# Patient Record
Sex: Male | Born: 1955 | Race: Black or African American | Hispanic: No | Marital: Single | Smoking: Current every day smoker
Health system: Southern US, Community
[De-identification: ages and names within clinical notes are randomized; demographics above are authoritative.]

## PROBLEM LIST (undated history)

## (undated) DIAGNOSIS — E1142 Type 2 diabetes mellitus with diabetic polyneuropathy: Secondary | ICD-10-CM

## (undated) DIAGNOSIS — Z5189 Encounter for other specified aftercare: Secondary | ICD-10-CM

## (undated) DIAGNOSIS — I2510 Atherosclerotic heart disease of native coronary artery without angina pectoris: Secondary | ICD-10-CM

## (undated) DIAGNOSIS — C189 Malignant neoplasm of colon, unspecified: Secondary | ICD-10-CM

## (undated) DIAGNOSIS — I252 Old myocardial infarction: Secondary | ICD-10-CM

## (undated) DIAGNOSIS — M109 Gout, unspecified: Secondary | ICD-10-CM

## (undated) DIAGNOSIS — L732 Hidradenitis suppurativa: Secondary | ICD-10-CM

## (undated) DIAGNOSIS — E119 Type 2 diabetes mellitus without complications: Secondary | ICD-10-CM

## (undated) DIAGNOSIS — F99 Mental disorder, not otherwise specified: Secondary | ICD-10-CM

## (undated) DIAGNOSIS — D649 Anemia, unspecified: Secondary | ICD-10-CM

## (undated) DIAGNOSIS — I208 Other forms of angina pectoris: Secondary | ICD-10-CM

## (undated) DIAGNOSIS — I2089 Other forms of angina pectoris: Secondary | ICD-10-CM

## (undated) DIAGNOSIS — I69359 Hemiplegia and hemiparesis following cerebral infarction affecting unspecified side: Secondary | ICD-10-CM

## (undated) DIAGNOSIS — E46 Unspecified protein-calorie malnutrition: Secondary | ICD-10-CM

## (undated) DIAGNOSIS — I639 Cerebral infarction, unspecified: Secondary | ICD-10-CM

## (undated) HISTORY — PX: CARDIAC SURGERY: SHX584

## (undated) HISTORY — PX: OTHER SURGICAL HISTORY: SHX169

---

## 2006-09-26 ENCOUNTER — Ambulatory Visit: Payer: Self-pay | Admitting: Oncology

## 2006-09-26 ENCOUNTER — Encounter (HOSPITAL_COMMUNITY): Admission: RE | Admit: 2006-09-26 | Discharge: 2006-09-26 | Payer: Self-pay | Admitting: Oncology

## 2006-09-26 LAB — CBC WITH DIFFERENTIAL (CANCER CENTER ONLY)
EOS%: 1.1 % (ref 0.0–7.0)
Eosinophils Absolute: 0.2 10*3/uL (ref 0.0–0.5)
LYMPH%: 12.3 % — ABNORMAL LOW (ref 14.0–48.0)
MCH: 21.3 pg — ABNORMAL LOW (ref 28.0–33.4)
MCHC: 30.8 g/dL — ABNORMAL LOW (ref 32.0–35.9)
MCV: 69 fL — ABNORMAL LOW (ref 82–98)
MONO%: 5.9 % (ref 0.0–13.0)
NEUT#: 11.4 10*3/uL — ABNORMAL HIGH (ref 1.5–6.5)
Platelets: 705 10*3/uL — ABNORMAL HIGH (ref 145–400)
RBC: 3.6 10*6/uL — ABNORMAL LOW (ref 4.20–5.70)
RDW: 16.5 % — ABNORMAL HIGH (ref 10.5–14.6)

## 2006-09-27 LAB — COMPREHENSIVE METABOLIC PANEL
ALT: 10 U/L (ref 0–53)
AST: 12 U/L (ref 0–37)
Albumin: 2.4 g/dL — ABNORMAL LOW (ref 3.5–5.2)
Alkaline Phosphatase: 121 U/L — ABNORMAL HIGH (ref 39–117)
BUN: 7 mg/dL (ref 6–23)
CO2: 28 mEq/L (ref 19–32)
Calcium: 8.8 mg/dL (ref 8.4–10.5)
Chloride: 98 mEq/L (ref 96–112)
Creatinine, Ser: 0.8 mg/dL (ref 0.40–1.50)
Glucose, Bld: 131 mg/dL — ABNORMAL HIGH (ref 70–99)
Potassium: 4 mEq/L (ref 3.5–5.3)
Sodium: 133 mEq/L — ABNORMAL LOW (ref 135–145)
Total Bilirubin: 0.4 mg/dL (ref 0.3–1.2)
Total Protein: 9.1 g/dL — ABNORMAL HIGH (ref 6.0–8.3)

## 2006-09-27 LAB — IRON AND TIBC
Iron: <10 ug/dL — ABNORMAL LOW (ref 42–165)
UIBC: 277 ug/dL

## 2006-09-27 LAB — FOLATE: Folate: 6 ng/mL

## 2006-09-27 LAB — LACTATE DEHYDROGENASE: LDH: 92 U/L — ABNORMAL LOW (ref 94–250)

## 2006-09-27 LAB — VITAMIN B12: Vitamin B-12: 441 pg/mL (ref 211–911)

## 2006-09-27 LAB — RETICULOCYTES (CHCC)
ABS Retic: 78.8 10*3/uL (ref 19.0–186.0)
RBC.: 3.58 MIL/uL — ABNORMAL LOW (ref 4.20–5.50)
Retic Ct Pct: 2.2 % (ref 0.4–3.1)

## 2006-09-27 LAB — ERYTHROPOIETIN: Erythropoietin: 156 m[IU]/mL — ABNORMAL HIGH (ref 2.6–34.0)

## 2006-11-14 ENCOUNTER — Ambulatory Visit: Payer: Self-pay | Admitting: Oncology

## 2009-06-28 ENCOUNTER — Inpatient Hospital Stay: Payer: Self-pay | Admitting: *Deleted

## 2010-03-27 ENCOUNTER — Inpatient Hospital Stay: Payer: Self-pay | Admitting: Internal Medicine

## 2010-05-25 ENCOUNTER — Inpatient Hospital Stay: Payer: Self-pay | Admitting: Surgery

## 2010-08-22 IMAGING — CR DG CHEST 1V PORT
1 series · 1 of 1 positions shown · non-contrast
Comparison: none

REASON FOR EXAM: crackles rll
COMMENTS:

[view not recorded]
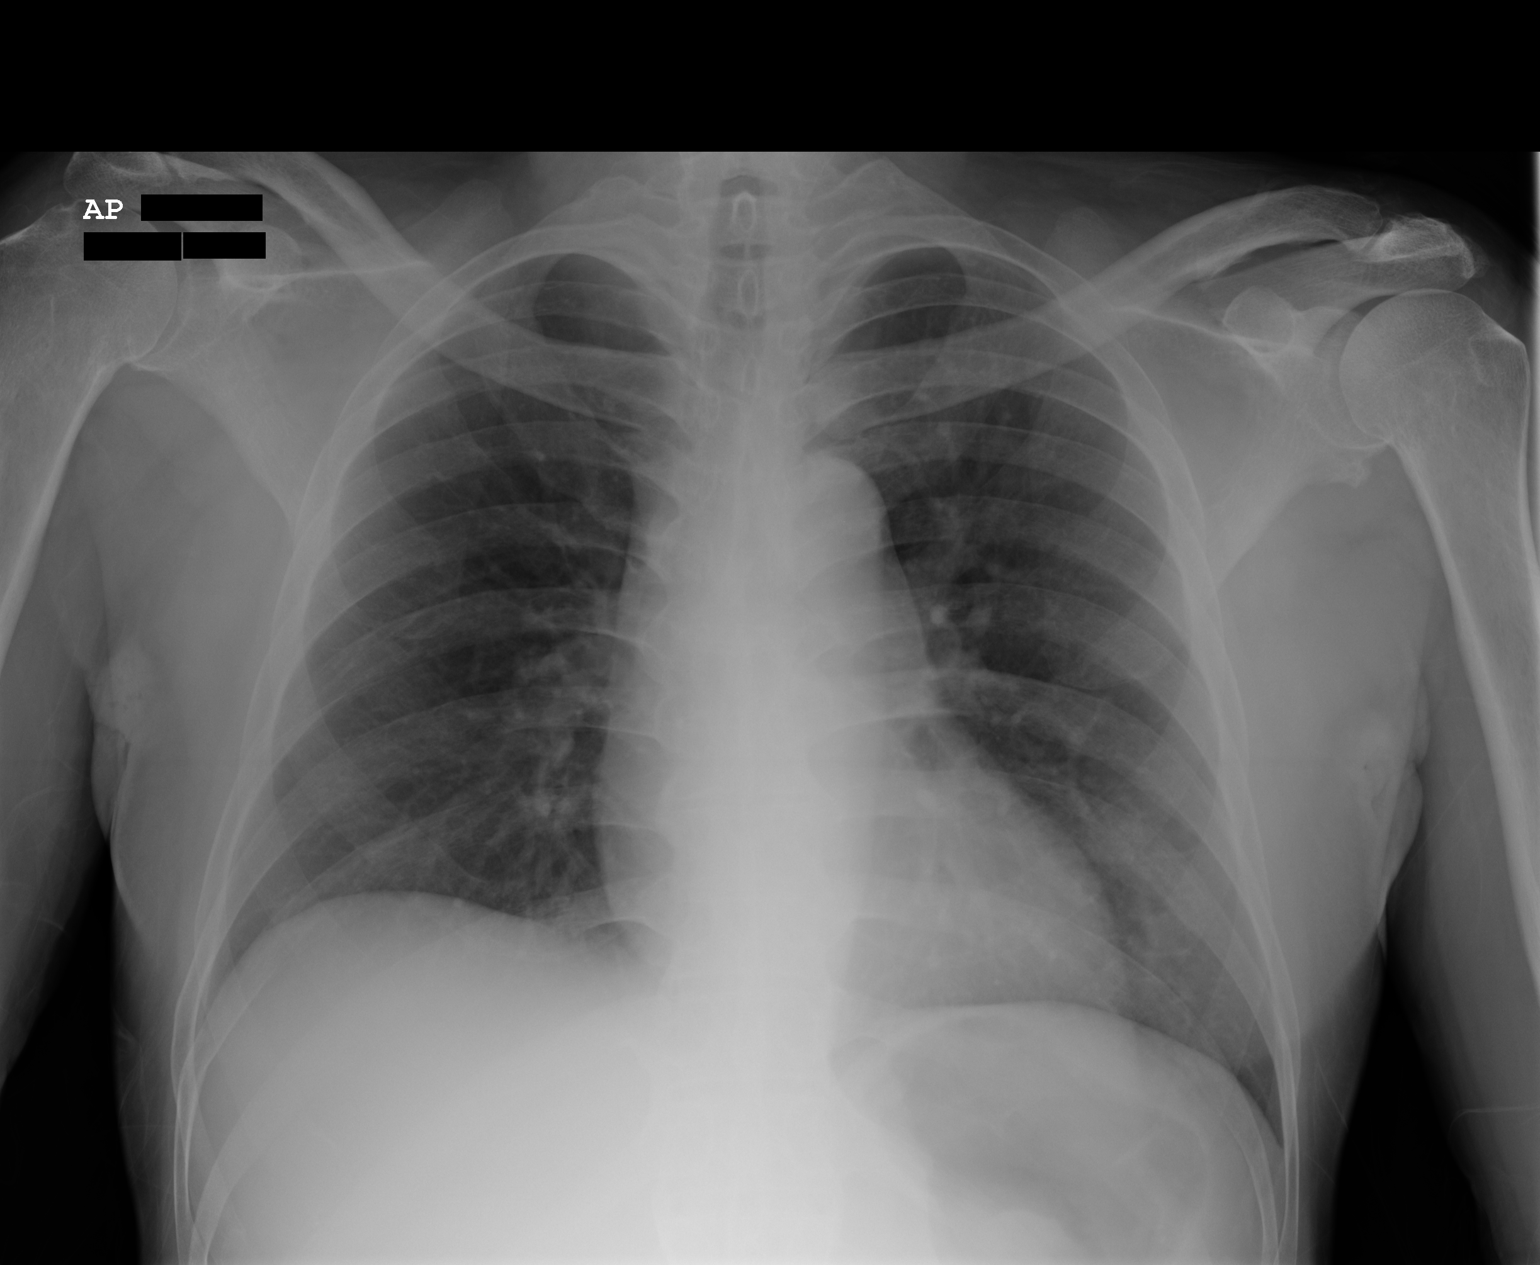

[1 of 1 positions shown; findings below may reference images not displayed]

PROCEDURE:     DXR - DXR PORTABLE CHEST SINGLE VIEW  - May 25, 2010  [DATE]

RESULT:     Comparison made to study 27 June, 2009.

The lungs are mildly hypo-inflated. There is no focal infiltrate. The
cardiac silhouette is normal in size. The pulmonary vascularity is not
engorged. Minimal prominence of the perihilar lung markings is noted but the
lungs are not well inflated.
IMPRESSION: I see no evidence of CHF nor of pneumonia. The study is
limited due to hypoinflation. Mildly increased perihilar lung markings are
noted but are nonspecific.

## 2010-08-22 IMAGING — CT CT PELVIS W/ CM
1 of 3 series · 14 of 32 positions shown, 19 images · non-contrast
Comparison: none

REASON FOR EXAM: buttock abcess large  iv contrast only
COMMENTS:

[Series 2: soft tissue · axial · 0.67mm/px · z∈[-344,-108]mm · 14 of 55 slices shown, 19 images]
[im 4/55  soft-tissue]
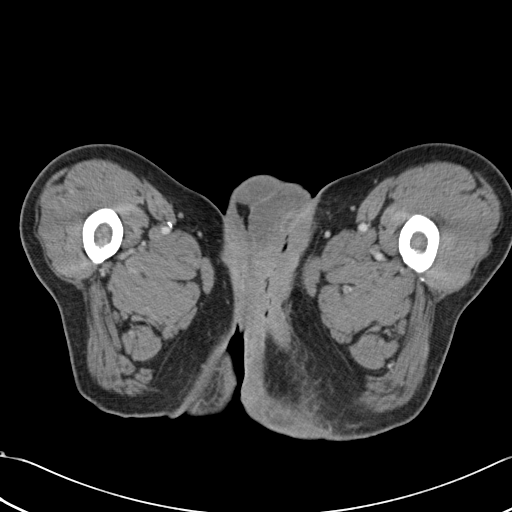
[im 4/55  bone]
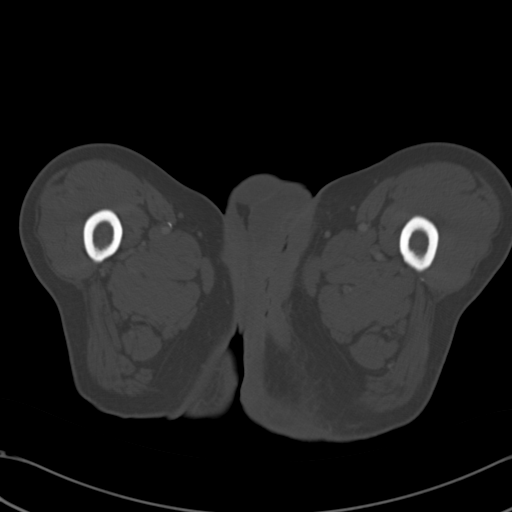
[im 7/55  soft-tissue]
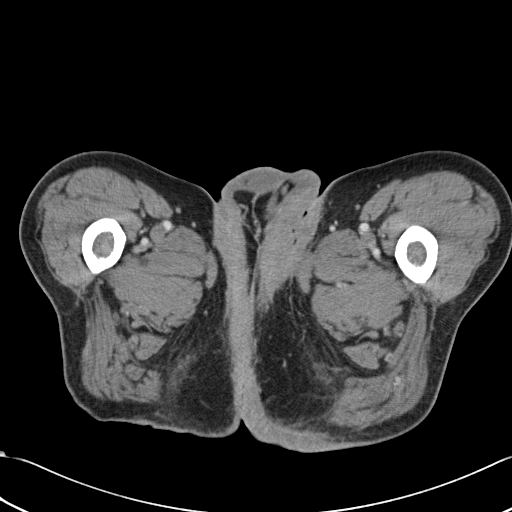
[im 11/55  soft-tissue]
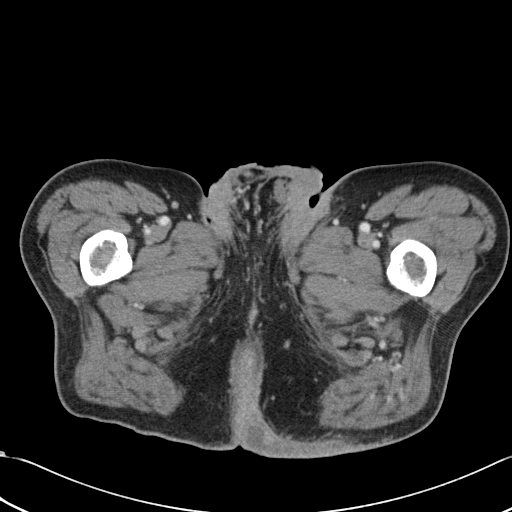
[im 17/55  soft-tissue]
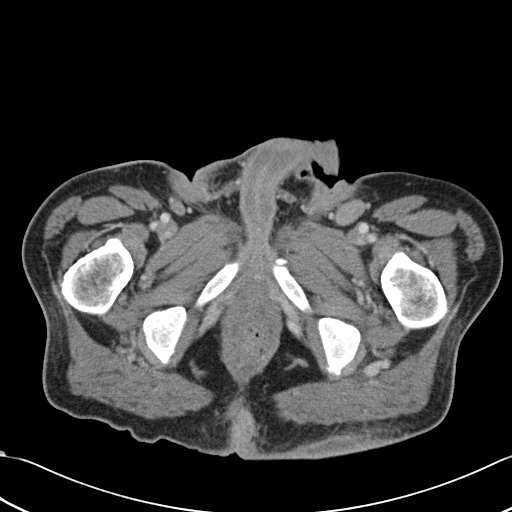
[im 21/55  soft-tissue]
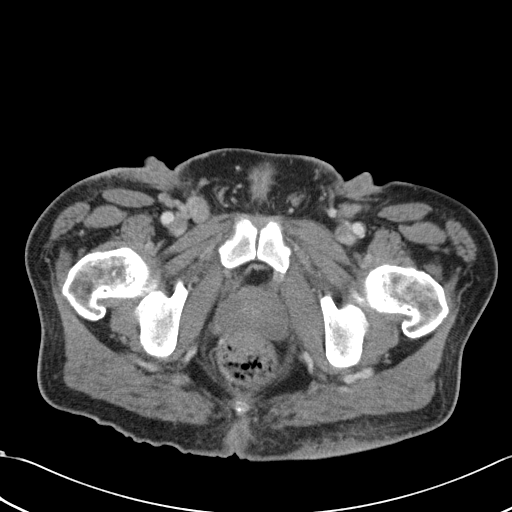
[im 24/55  soft-tissue]
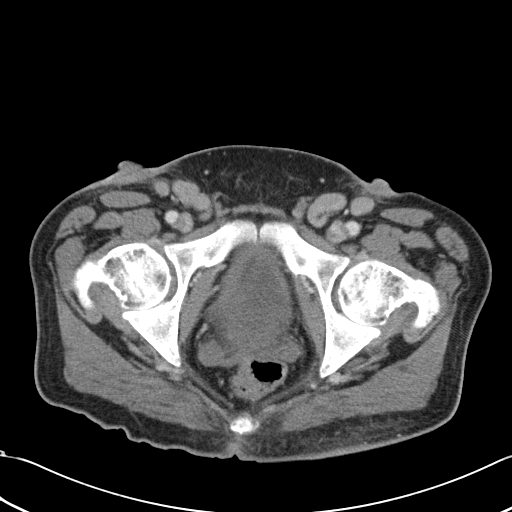
[im 28/55  soft-tissue]
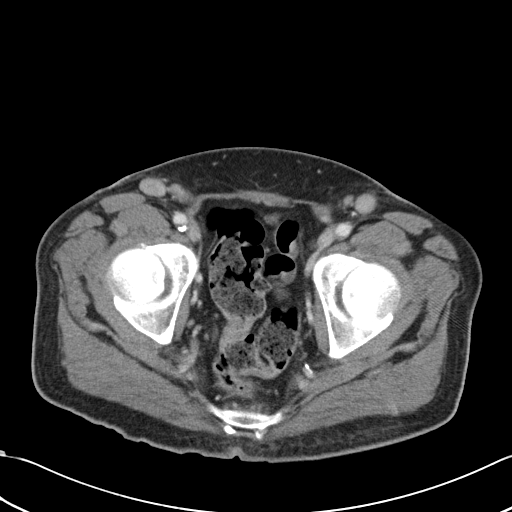
[im 31/55  soft-tissue]
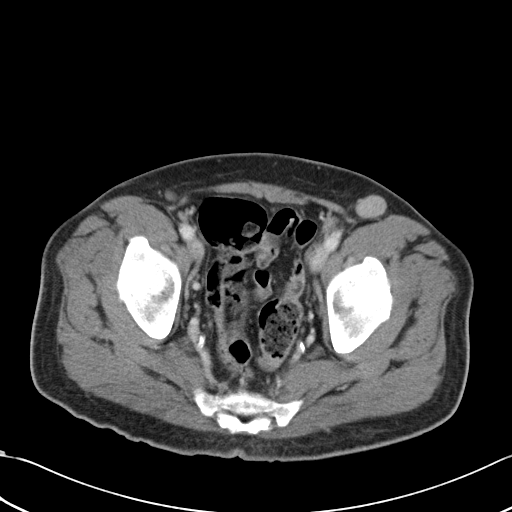
[im 34/55  soft-tissue]
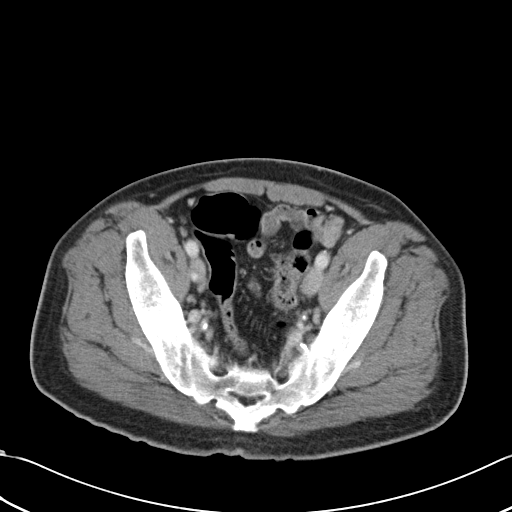
[im 34/55  bone]
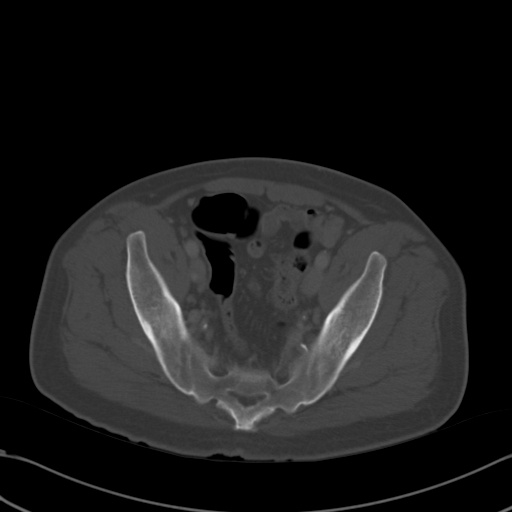
[im 38/55  soft-tissue]
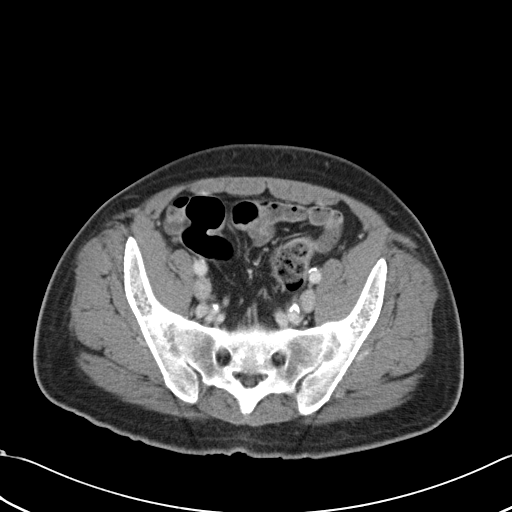
[im 41/55  lung]
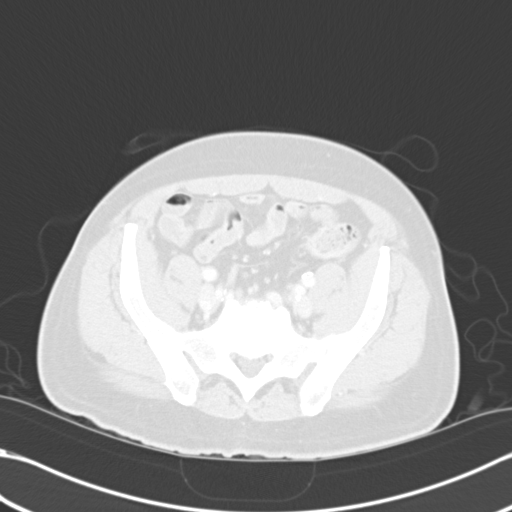
[im 44/55  soft-tissue]
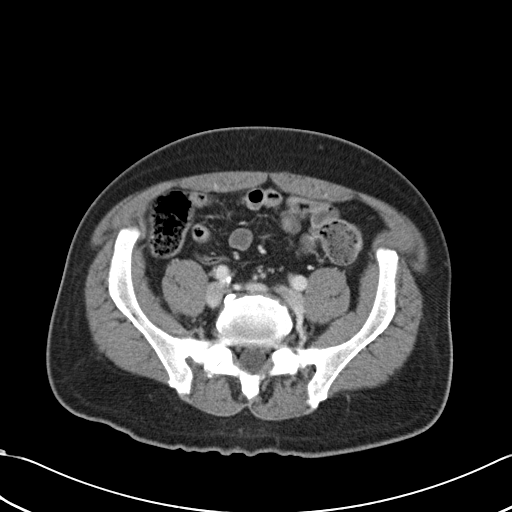
[im 44/55  lung]
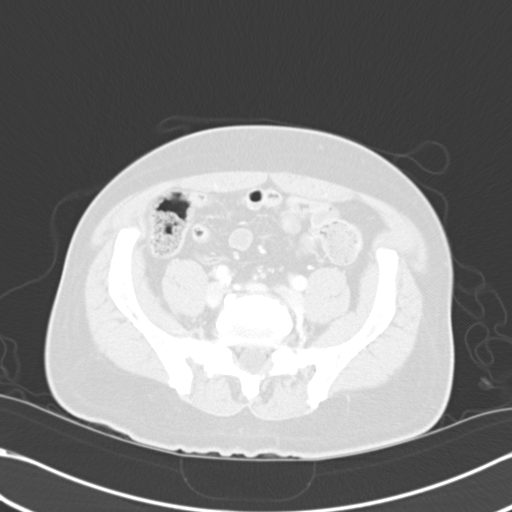
[im 48/55  soft-tissue]
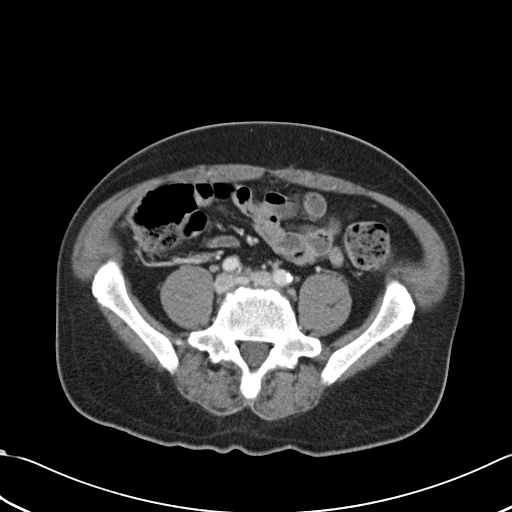
[im 48/55  lung]
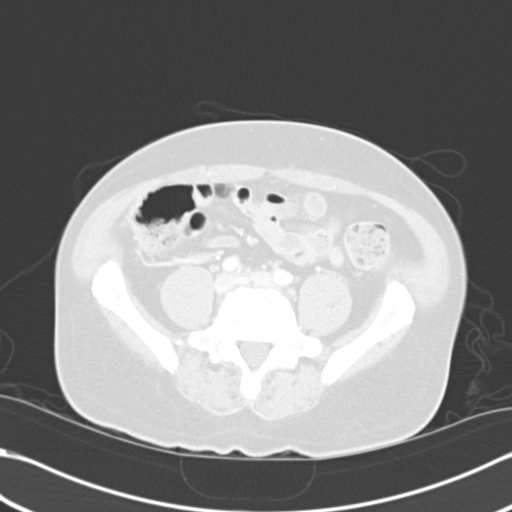
[im 51/55  soft-tissue]
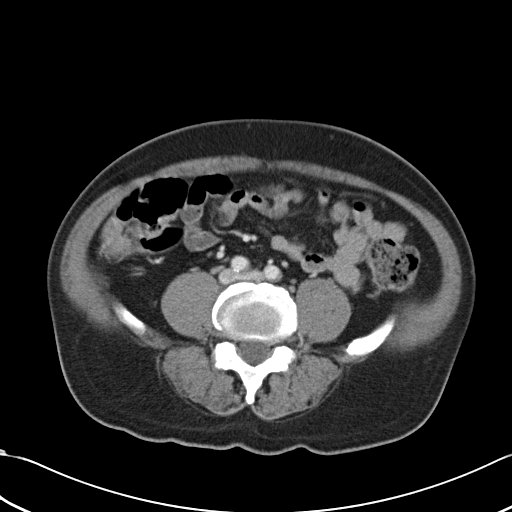
[im 51/55  lung]
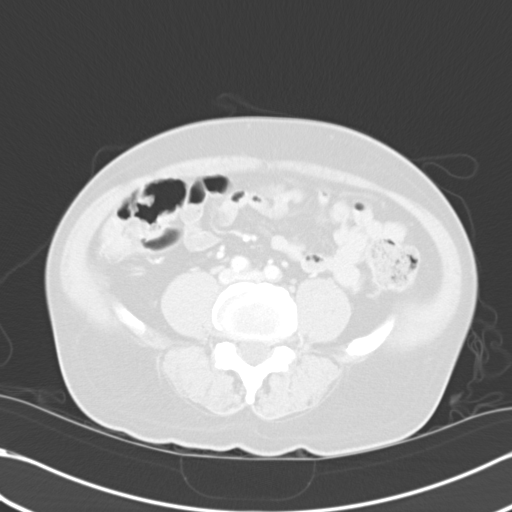

[14 of 32 positions shown; findings below may reference images not displayed]

PROCEDURE:     CT  - CT PELVIS STANDARD W  - May 25, 2010  [DATE]

RESULT:     CT of the pelvis is performed emergently with 100 mL of
9sovue-VII iodinated intravenous contrast. Oral contrast was not
administered. Images are reconstructed at 5 mm slice thickness in the axial
plane. There is no previous exam for comparison.

There is soft tissue thickening along the medial proximal left thigh
extending into the peritoneal region and left gluteal region consistent with
inflammation. There does appear to be a small amount of fluid near the
midline to the left measuring approximately 1.65 cm on image 20 with a thin
area of fluid attenuation measuring 7.1 mm in depth and approximately
cm in length over the left inferior gluteal region on image 15. Bilateral
inguinal adenopathy is present. Within the pelvis the urinary bladder, colon
and visualized small bowel is unremarkable.
IMPRESSION: Soft tissue abscess over the left gluteal region and near
the perineum region and medial left thigh. Bilateral inguinal adenopathy. No
pelvic adenopathy seen. Surgical consultation and infectious disease
consultation is recommended.

## 2011-07-26 ENCOUNTER — Emergency Department: Payer: Self-pay | Admitting: Emergency Medicine

## 2011-10-10 ENCOUNTER — Inpatient Hospital Stay: Payer: Self-pay | Admitting: Internal Medicine

## 2011-10-26 ENCOUNTER — Encounter: Payer: Self-pay | Admitting: Family Medicine

## 2011-11-11 ENCOUNTER — Encounter: Payer: Self-pay | Admitting: Family Medicine

## 2011-12-04 ENCOUNTER — Emergency Department: Payer: Self-pay | Admitting: Emergency Medicine

## 2011-12-10 ENCOUNTER — Encounter: Payer: Self-pay | Admitting: Family Medicine

## 2012-01-02 ENCOUNTER — Emergency Department: Payer: Self-pay | Admitting: Internal Medicine

## 2012-03-10 ENCOUNTER — Inpatient Hospital Stay: Payer: Self-pay | Admitting: Internal Medicine

## 2012-04-03 ENCOUNTER — Emergency Department: Payer: Self-pay | Admitting: *Deleted

## 2012-04-10 ENCOUNTER — Inpatient Hospital Stay: Payer: Self-pay | Admitting: Internal Medicine

## 2012-04-27 ENCOUNTER — Inpatient Hospital Stay: Payer: Self-pay | Admitting: Surgery

## 2012-08-21 ENCOUNTER — Ambulatory Visit: Payer: Self-pay | Admitting: Oncology

## 2012-09-08 ENCOUNTER — Ambulatory Visit: Payer: Self-pay | Admitting: Oncology

## 2012-09-11 ENCOUNTER — Ambulatory Visit: Payer: Self-pay | Admitting: Oncology

## 2012-09-21 ENCOUNTER — Emergency Department: Payer: Self-pay | Admitting: Emergency Medicine

## 2012-10-08 ENCOUNTER — Ambulatory Visit: Payer: Self-pay | Admitting: Oncology

## 2013-02-06 ENCOUNTER — Ambulatory Visit: Payer: Self-pay | Admitting: Oncology

## 2013-03-30 ENCOUNTER — Inpatient Hospital Stay (HOSPITAL_COMMUNITY)
Admission: EM | Admit: 2013-03-30 | Discharge: 2013-04-10 | DRG: 607 | Disposition: A | Discharge status: Skilled Nursing Facility | Payer: Medicare (Managed Care) | Attending: Internal Medicine | Admitting: Internal Medicine

## 2013-03-30 ENCOUNTER — Emergency Department (HOSPITAL_COMMUNITY): Payer: Medicare (Managed Care)

## 2013-03-30 ENCOUNTER — Encounter (HOSPITAL_COMMUNITY): Payer: Self-pay | Admitting: Cardiology

## 2013-03-30 DIAGNOSIS — L8990 Pressure ulcer of unspecified site, unspecified stage: Secondary | ICD-10-CM | POA: Diagnosis present

## 2013-03-30 DIAGNOSIS — E785 Hyperlipidemia, unspecified: Secondary | ICD-10-CM | POA: Diagnosis present

## 2013-03-30 DIAGNOSIS — D500 Iron deficiency anemia secondary to blood loss (chronic): Secondary | ICD-10-CM | POA: Diagnosis present

## 2013-03-30 DIAGNOSIS — L02419 Cutaneous abscess of limb, unspecified: Secondary | ICD-10-CM | POA: Diagnosis present

## 2013-03-30 DIAGNOSIS — Z85038 Personal history of other malignant neoplasm of large intestine: Secondary | ICD-10-CM

## 2013-03-30 DIAGNOSIS — Z79899 Other long term (current) drug therapy: Secondary | ICD-10-CM

## 2013-03-30 DIAGNOSIS — IMO0002 Reserved for concepts with insufficient information to code with codable children: Secondary | ICD-10-CM

## 2013-03-30 DIAGNOSIS — Z7982 Long term (current) use of aspirin: Secondary | ICD-10-CM

## 2013-03-30 DIAGNOSIS — L0390 Cellulitis, unspecified: Secondary | ICD-10-CM

## 2013-03-30 DIAGNOSIS — L89309 Pressure ulcer of unspecified buttock, unspecified stage: Secondary | ICD-10-CM | POA: Diagnosis present

## 2013-03-30 DIAGNOSIS — F209 Schizophrenia, unspecified: Secondary | ICD-10-CM

## 2013-03-30 DIAGNOSIS — L0231 Cutaneous abscess of buttock: Secondary | ICD-10-CM | POA: Diagnosis present

## 2013-03-30 DIAGNOSIS — F0390 Unspecified dementia without behavioral disturbance: Secondary | ICD-10-CM

## 2013-03-30 DIAGNOSIS — Z9861 Coronary angioplasty status: Secondary | ICD-10-CM

## 2013-03-30 DIAGNOSIS — E1149 Type 2 diabetes mellitus with other diabetic neurological complication: Secondary | ICD-10-CM | POA: Diagnosis present

## 2013-03-30 DIAGNOSIS — A419 Sepsis, unspecified organism: Secondary | ICD-10-CM

## 2013-03-30 DIAGNOSIS — M109 Gout, unspecified: Secondary | ICD-10-CM | POA: Diagnosis present

## 2013-03-30 DIAGNOSIS — I252 Old myocardial infarction: Secondary | ICD-10-CM

## 2013-03-30 DIAGNOSIS — I2510 Atherosclerotic heart disease of native coronary artery without angina pectoris: Secondary | ICD-10-CM

## 2013-03-30 DIAGNOSIS — Z7902 Long term (current) use of antithrombotics/antiplatelets: Secondary | ICD-10-CM

## 2013-03-30 DIAGNOSIS — Z8673 Personal history of transient ischemic attack (TIA), and cerebral infarction without residual deficits: Secondary | ICD-10-CM

## 2013-03-30 DIAGNOSIS — D649 Anemia, unspecified: Secondary | ICD-10-CM | POA: Diagnosis present

## 2013-03-30 DIAGNOSIS — F17200 Nicotine dependence, unspecified, uncomplicated: Secondary | ICD-10-CM | POA: Diagnosis present

## 2013-03-30 DIAGNOSIS — E119 Type 2 diabetes mellitus without complications: Secondary | ICD-10-CM

## 2013-03-30 DIAGNOSIS — E46 Unspecified protein-calorie malnutrition: Secondary | ICD-10-CM | POA: Diagnosis present

## 2013-03-30 DIAGNOSIS — L02219 Cutaneous abscess of trunk, unspecified: Secondary | ICD-10-CM | POA: Diagnosis present

## 2013-03-30 DIAGNOSIS — E1169 Type 2 diabetes mellitus with other specified complication: Secondary | ICD-10-CM | POA: Diagnosis not present

## 2013-03-30 DIAGNOSIS — L732 Hidradenitis suppurativa: Secondary | ICD-10-CM

## 2013-03-30 DIAGNOSIS — I69959 Hemiplegia and hemiparesis following unspecified cerebrovascular disease affecting unspecified side: Secondary | ICD-10-CM

## 2013-03-30 DIAGNOSIS — E871 Hypo-osmolality and hyponatremia: Secondary | ICD-10-CM | POA: Diagnosis present

## 2013-03-30 DIAGNOSIS — K5900 Constipation, unspecified: Secondary | ICD-10-CM | POA: Diagnosis present

## 2013-03-30 DIAGNOSIS — E1142 Type 2 diabetes mellitus with diabetic polyneuropathy: Secondary | ICD-10-CM | POA: Diagnosis present

## 2013-03-30 DIAGNOSIS — L03119 Cellulitis of unspecified part of limb: Secondary | ICD-10-CM | POA: Diagnosis present

## 2013-03-30 HISTORY — DX: Other forms of angina pectoris: I20.8

## 2013-03-30 HISTORY — DX: Cerebral infarction, unspecified: I63.9

## 2013-03-30 HISTORY — DX: Old myocardial infarction: I25.2

## 2013-03-30 HISTORY — DX: Atherosclerotic heart disease of native coronary artery without angina pectoris: I25.10

## 2013-03-30 HISTORY — DX: Type 2 diabetes mellitus with diabetic polyneuropathy: E11.42

## 2013-03-30 HISTORY — DX: Malignant neoplasm of colon, unspecified: C18.9

## 2013-03-30 HISTORY — DX: Gout, unspecified: M10.9

## 2013-03-30 HISTORY — DX: Anemia, unspecified: D64.9

## 2013-03-30 HISTORY — DX: Other forms of angina pectoris: I20.89

## 2013-03-30 HISTORY — DX: Hemiplegia and hemiparesis following cerebral infarction affecting unspecified side: I69.359

## 2013-03-30 HISTORY — DX: Encounter for other specified aftercare: Z51.89

## 2013-03-30 HISTORY — DX: Hidradenitis suppurativa: L73.2

## 2013-03-30 HISTORY — DX: Unspecified protein-calorie malnutrition: E46

## 2013-03-30 LAB — URINALYSIS, ROUTINE W REFLEX MICROSCOPIC
Glucose, UA: 100 mg/dL — AB
Hgb urine dipstick: NEGATIVE
Specific Gravity, Urine: 1.009 (ref 1.005–1.030)

## 2013-03-30 LAB — COMPREHENSIVE METABOLIC PANEL
ALT: 19 U/L (ref 0–53)
Albumin: 2.1 g/dL — ABNORMAL LOW (ref 3.5–5.2)
Alkaline Phosphatase: 218 U/L — ABNORMAL HIGH (ref 39–117)
BUN: 6 mg/dL (ref 6–23)
Calcium: 9.1 mg/dL (ref 8.4–10.5)
Potassium: 3.7 mEq/L (ref 3.5–5.1)
Sodium: 132 mEq/L — ABNORMAL LOW (ref 135–145)
Total Protein: 11.4 g/dL — ABNORMAL HIGH (ref 6.0–8.3)

## 2013-03-30 LAB — CG4 I-STAT (LACTIC ACID): Lactic Acid, Venous: 2.2 mmol/L (ref 0.5–2.2)

## 2013-03-30 LAB — GRAM STAIN

## 2013-03-30 LAB — CBC
MCH: 30.9 pg (ref 26.0–34.0)
MCH: 29.9 pg (ref 26.0–34.0)
MCHC: 33.6 g/dL (ref 30.0–36.0)
MCHC: 32.6 g/dL (ref 30.0–36.0)
Platelets: 479 10*3/uL — ABNORMAL HIGH (ref 150–400)
RDW: 15 % (ref 11.5–15.5)

## 2013-03-30 LAB — POCT I-STAT TROPONIN I: Troponin i, poc: 0 ng/mL (ref 0.00–0.08)

## 2013-03-30 LAB — PROCALCITONIN: Procalcitonin: 0.22 ng/mL

## 2013-03-30 MED ORDER — FERROUS SULFATE 325 (65 FE) MG PO TABS
325.0000 mg | ORAL_TABLET | Freq: Two times a day (BID) | ORAL | Status: DC
  Administered 2013-03-31 – 2013-04-10 (×22): 325 mg via ORAL
  Filled 2013-03-30 (×23): qty 1

## 2013-03-30 MED ORDER — GABAPENTIN 300 MG PO CAPS
600.0000 mg | ORAL_CAPSULE | Freq: Every day | ORAL | Status: DC
  Administered 2013-03-31 – 2013-04-09 (×11): 600 mg via ORAL
  Filled 2013-03-30 (×12): qty 2

## 2013-03-30 MED ORDER — SODIUM CHLORIDE 0.9 % IJ SOLN
3.0000 mL | Freq: Two times a day (BID) | INTRAMUSCULAR | Status: DC
  Administered 2013-04-03 – 2013-04-10 (×5): 3 mL via INTRAVENOUS

## 2013-03-30 MED ORDER — ALLOPURINOL 300 MG PO TABS
300.0000 mg | ORAL_TABLET | Freq: Every day | ORAL | Status: DC
  Administered 2013-03-31 – 2013-04-10 (×11): 300 mg via ORAL
  Filled 2013-03-30 (×11): qty 1

## 2013-03-30 MED ORDER — ENSURE PLUS PO LIQD
237.0000 mL | Freq: Two times a day (BID) | ORAL | Status: DC
  Administered 2013-03-31 – 2013-04-04 (×9): 237 mL via ORAL
  Filled 2013-03-30 (×14): qty 237

## 2013-03-30 MED ORDER — ASPIRIN EC 81 MG PO TBEC
81.0000 mg | DELAYED_RELEASE_TABLET | Freq: Every day | ORAL | Status: DC
  Administered 2013-03-31 – 2013-04-06 (×7): 81 mg via ORAL
  Filled 2013-03-30 (×8): qty 1

## 2013-03-30 MED ORDER — BENZTROPINE MESYLATE 1 MG PO TABS
1.0000 mg | ORAL_TABLET | Freq: Every day | ORAL | Status: DC
  Administered 2013-03-31 – 2013-04-10 (×11): 1 mg via ORAL
  Filled 2013-03-30 (×11): qty 1

## 2013-03-30 MED ORDER — CLOPIDOGREL BISULFATE 75 MG PO TABS
75.0000 mg | ORAL_TABLET | Freq: Every day | ORAL | Status: DC
  Administered 2013-03-31 – 2013-04-06 (×7): 75 mg via ORAL
  Filled 2013-03-30 (×8): qty 1

## 2013-03-30 MED ORDER — SODIUM CHLORIDE 0.9 % IV SOLN: 1000.0000 mL | Freq: Once | INTRAVENOUS | Status: DC

## 2013-03-30 MED ORDER — ATORVASTATIN CALCIUM 80 MG PO TABS
80.0000 mg | ORAL_TABLET | Freq: Every day | ORAL | Status: DC
  Administered 2013-03-31 – 2013-04-10 (×11): 80 mg via ORAL
  Filled 2013-03-30 (×11): qty 1

## 2013-03-30 MED ORDER — RISPERIDONE 3 MG PO TABS
3.0000 mg | ORAL_TABLET | Freq: Two times a day (BID) | ORAL | Status: DC
  Administered 2013-03-31 – 2013-04-10 (×22): 3 mg via ORAL
  Filled 2013-03-30 (×23): qty 1

## 2013-03-30 MED ORDER — POTASSIUM CHLORIDE CRYS ER 20 MEQ PO TBCR
60.0000 meq | EXTENDED_RELEASE_TABLET | Freq: Every day | ORAL | Status: DC
  Administered 2013-03-31 – 2013-04-03 (×4): 60 meq via ORAL
  Filled 2013-03-30 (×5): qty 3

## 2013-03-30 MED ORDER — SODIUM CHLORIDE 0.9 % IV SOLN
INTRAVENOUS | Status: DC
  Administered 2013-03-30 – 2013-03-31 (×2): via INTRAVENOUS

## 2013-03-30 MED ORDER — CAMPHOR-MENTHOL 0.5-0.5 % EX LOTN
1.0000 "application " | TOPICAL_LOTION | Freq: Every day | CUTANEOUS | Status: DC | PRN
  Filled 2013-03-30: qty 222

## 2013-03-30 MED ORDER — ONDANSETRON HCL 4 MG/2ML IJ SOLN: 4.0000 mg | Freq: Four times a day (QID) | INTRAMUSCULAR | Status: DC | PRN

## 2013-03-30 MED ORDER — DEXTROSE 50 % IV SOLN
25.0000 g | Freq: Once | INTRAVENOUS | Status: AC
  Administered 2013-03-30: 25 g via INTRAVENOUS
  Filled 2013-03-30: qty 50

## 2013-03-30 MED ORDER — MAGNESIUM OXIDE 400 (241.3 MG) MG PO TABS
400.0000 mg | ORAL_TABLET | Freq: Three times a day (TID) | ORAL | Status: DC
  Administered 2013-03-31 – 2013-04-10 (×29): 400 mg via ORAL
  Filled 2013-03-30 (×36): qty 1

## 2013-03-30 MED ORDER — MIRTAZAPINE 15 MG PO TABS
15.0000 mg | ORAL_TABLET | Freq: Every day | ORAL | Status: DC
  Administered 2013-03-31 – 2013-04-09 (×11): 15 mg via ORAL
  Filled 2013-03-30 (×12): qty 1

## 2013-03-30 MED ORDER — VANCOMYCIN HCL IN DEXTROSE 1-5 GM/200ML-% IV SOLN
1000.0000 mg | Freq: Two times a day (BID) | INTRAVENOUS | Status: DC
  Administered 2013-03-31 – 2013-04-01 (×3): 1000 mg via INTRAVENOUS
  Filled 2013-03-30 (×5): qty 200

## 2013-03-30 MED ORDER — IOHEXOL 300 MG/ML  SOLN
50.0000 mL | Freq: Once | INTRAMUSCULAR | Status: AC | PRN
  Administered 2013-03-30: 50 mL via ORAL

## 2013-03-30 MED ORDER — PIPERACILLIN-TAZOBACTAM 3.375 G IVPB
3.3750 g | Freq: Three times a day (TID) | INTRAVENOUS | Status: DC
  Administered 2013-03-31 – 2013-04-03 (×11): 3.375 g via INTRAVENOUS
  Filled 2013-03-30 (×14): qty 50

## 2013-03-30 MED ORDER — GLIPIZIDE ER 10 MG PO TB24
10.0000 mg | ORAL_TABLET | Freq: Two times a day (BID) | ORAL | Status: DC
  Filled 2013-03-30 (×3): qty 1

## 2013-03-30 MED ORDER — VANCOMYCIN HCL IN DEXTROSE 1-5 GM/200ML-% IV SOLN
1000.0000 mg | Freq: Once | INTRAVENOUS | Status: AC
  Administered 2013-03-30: 1000 mg via INTRAVENOUS
  Filled 2013-03-30: qty 200

## 2013-03-30 MED ORDER — TRIAMCINOLONE ACETONIDE 0.1 % EX CREA
1.0000 | TOPICAL_CREAM | Freq: Two times a day (BID) | CUTANEOUS | Status: DC | PRN
Start: 2013-03-30 — End: 2013-04-10
  Administered 2013-04-02: 1 via TOPICAL
  Filled 2013-03-30: qty 15

## 2013-03-30 MED ORDER — POLYETHYLENE GLYCOL 3350 17 G PO PACK
17.0000 g | PACK | Freq: Every day | ORAL | Status: DC
  Administered 2013-04-01 – 2013-04-10 (×9): 17 g via ORAL
  Filled 2013-03-30 (×12): qty 1

## 2013-03-30 MED ORDER — QUETIAPINE FUMARATE 200 MG PO TABS
200.0000 mg | ORAL_TABLET | Freq: Every day | ORAL | Status: DC
  Administered 2013-03-31 – 2013-04-09 (×11): 200 mg via ORAL
  Filled 2013-03-30 (×12): qty 1

## 2013-03-30 MED ORDER — CLINDAMYCIN PHOSPHATE 1 % EX SOLN
1.0000 | Freq: Two times a day (BID) | CUTANEOUS | Status: DC | PRN
Start: 2013-03-30 — End: 2013-04-10
  Filled 2013-03-30: qty 30

## 2013-03-30 MED ORDER — PIPERACILLIN-TAZOBACTAM 3.375 G IVPB 30 MIN
3.3750 g | Freq: Once | INTRAVENOUS | Status: AC
  Administered 2013-03-30: 3.375 g via INTRAVENOUS
  Filled 2013-03-30: qty 50

## 2013-03-30 MED ORDER — SODIUM CHLORIDE 0.9 % IV SOLN: 1000.0000 mL | INTRAVENOUS | Status: DC

## 2013-03-30 MED ORDER — ACETAMINOPHEN 650 MG RE SUPP: 650.0000 mg | Freq: Four times a day (QID) | RECTAL | Status: DC | PRN

## 2013-03-30 MED ORDER — ONDANSETRON HCL 4 MG PO TABS: 4.0000 mg | ORAL_TABLET | Freq: Four times a day (QID) | ORAL | Status: DC | PRN

## 2013-03-30 MED ORDER — SODIUM CHLORIDE 0.9 % IV SOLN
1000.0000 mL | Freq: Once | INTRAVENOUS | Status: AC
  Administered 2013-03-30: 1000 mL via INTRAVENOUS

## 2013-03-30 MED ORDER — ACETAMINOPHEN 325 MG PO TABS: 650.0000 mg | ORAL_TABLET | Freq: Four times a day (QID) | ORAL | Status: DC | PRN

## 2013-03-30 MED ORDER — INSULIN ASPART 100 UNIT/ML ~~LOC~~ SOLN
0.0000 [IU] | Freq: Three times a day (TID) | SUBCUTANEOUS | Status: DC
  Administered 2013-04-01: 1 [IU] via SUBCUTANEOUS
  Administered 2013-04-02: 3 [IU] via SUBCUTANEOUS
  Administered 2013-04-03: 2 [IU] via SUBCUTANEOUS
  Administered 2013-04-03: 3 [IU] via SUBCUTANEOUS
  Administered 2013-04-03 – 2013-04-04 (×2): 2 [IU] via SUBCUTANEOUS
  Administered 2013-04-04: 3 [IU] via SUBCUTANEOUS
  Administered 2013-04-05 (×2): 2 [IU] via SUBCUTANEOUS
  Administered 2013-04-05 – 2013-04-06 (×4): 3 [IU] via SUBCUTANEOUS
  Administered 2013-04-07: 5 [IU] via SUBCUTANEOUS
  Administered 2013-04-08: 1 [IU] via SUBCUTANEOUS
  Administered 2013-04-08 (×2): 3 [IU] via SUBCUTANEOUS
  Administered 2013-04-09: 2 [IU] via SUBCUTANEOUS
  Administered 2013-04-09: 5 [IU] via SUBCUTANEOUS
  Administered 2013-04-09: 7 [IU] via SUBCUTANEOUS
  Administered 2013-04-10: 2 [IU] via SUBCUTANEOUS

## 2013-03-30 NOTE — Progress Notes (Signed)
ANTIBIOTIC CONSULT NOTE - INITIAL  Pharmacy Consult for Vancomycin/Zosyn Indication: rule out sepsis  Allergies  Allergen Reactions  . Asa (Aspirin)     Patient Measurements: Height: 5\' 5"  (165.1 cm) Weight: 126 lb (57.153 kg) IBW/kg (Calculated) : 61.5  Vital Signs: Temp: 98.4 F (36.9 C) (05/23 1558) Temp src: Oral (05/23 1558) BP: 97/54 mmHg (05/23 1558) Pulse Rate: 104 (05/23 1558) Intake/Output from previous day:   Intake/Output from this shift:    Labs:  Recent Labs  03/30/13 1606  WBC 11.1*  HGB 9.9*  PLT 524*  CREATININE 0.58   Estimated Creatinine Clearance: 81.4 ml/min (by C-G formula based on Cr of 0.58). No results found for this basename: VANCOTROUGH, VANCOPEAK, VANCORANDOM, GENTTROUGH, GENTPEAK, GENTRANDOM, TOBRATROUGH, TOBRAPEAK, TOBRARND, AMIKACINPEAK, AMIKACINTROU, AMIKACIN,  in the last 72 hours   Microbiology: No results found for this or any previous visit (from the past 720 hour(s)).  Medical History: Past Medical History  Diagnosis Date  . Diabetes mellitus with polyneuropathy   . Blood transfusion without reported diagnosis   . Anemia   . Adenocarcinoma, colon     Colon  . Gout   . CAD (coronary artery disease)   . MI, old   . CVA (cerebral infarction)   . Hemiplegia, post-stroke   . Protein calorie malnutrition   . Stable angina   . Suppurative hidradenitis     ch    Medications:   (Not in a hospital admission) Assessment: 57 y/o male patient admitted with hypotension and leukocytosis requiring broad spectrum antibiotics for r/o sepsis. Received vancomycin 1g and zoysn 3.375g in ED.  Goal of Therapy:  Vancomycin trough level 10-15 mcg/ml  Plan:  Vancomycin 1g IV q12h, zosyn 3.375g IV q8h , and monitor renal function. Measure antibiotic drug levels at steady state Follow up culture results  Verlene Mayer, PharmD, BCPS Pager [phone] 03/30/2013,6:33 PM

## 2013-03-30 NOTE — ED Notes (Signed)
Pt finished contrast

## 2013-03-30 NOTE — ED Notes (Signed)
Pt gram stain neg for bacteria with 2 white cells. MD notified currently at bedside.

## 2013-03-30 NOTE — ED Notes (Signed)
NP made aware of lactic acid of 2.20 no orders at this time.

## 2013-03-30 NOTE — H&P (Signed)
[facility] History and Physical  Xavier Khan ZOX:[id number] DOB: July 24, 1956 DOA: 03/30/2013  Referring physician: ER physician. PCP: No PCP Per Patient Immunologist care.  Chief Complaint: Weakness and bleeding from the perineal area.  HPI: Xavier Khan is a 57 y.o. male with known history of hidranitis suppuritiva, CAD status post stenting, colon cancer status post surgery last year, CVA with left-sided hemiplegia, schizophrenia, chronic anemia was found to have increasing bleeding from his groin area where patient has hidranitis suppuritiva. As per patient's sister with whom patient lives patient was being treated with multiple does of antibiotics over the last one month it to increased drainage from the groin area. Patient also has been having slow blood loss from the area. Today patient was noticed to have more than usual blood loss and patient was felt to be weak. On arrival in the ER patient was found to be tachycardic and tachypneic and it improved with fluids. On exam patient had increase drainage from the groin area with blood. On-call surgeon was consulted and at this time patient has been admitted for worsening of is hidradenitis suppurativa. CT abdomen and pelvis has been ordered as requested by surgery.  Review of Systems: As presented in the history of presenting illness, rest negative.  Past Medical History  Diagnosis Date  . Diabetes mellitus with polyneuropathy   . Blood transfusion without reported diagnosis   . Anemia   . Adenocarcinoma, colon     Colon  . Gout   . CAD (coronary artery disease)   . MI, old   . CVA (cerebral infarction)   . Hemiplegia, post-stroke   . Protein calorie malnutrition   . Stable angina   . Suppurative hidradenitis     ch   Past Surgical History  Procedure Laterality Date  . Cardiac surgery      Stent 2013  . Coloscop    . Status post hemicolectomy      February 2013 at [facility]   Social History:  reports that he has been  smoking Cigarettes.  He has been smoking about 0.00 packs per day. He does not have any smokeless tobacco history on file. He reports that he does not drink alcohol or use illicit drugs. Lives at home. where does patient live-- Can do ADLs. Can patient participate in ADLs?  Allergies  Allergen Reactions  . Asa (Aspirin)     History reviewed. No pertinent family history.    Prior to Admission medications   Medication Sig Start Date End Date Taking? Authorizing Provider  acetaminophen (TYLENOL) 650 MG CR tablet Take 650 mg by mouth every 8 (eight) hours as needed for pain.   Yes Historical Provider, MD  allopurinol (ZYLOPRIM) 300 MG tablet Take 300 mg by mouth daily.   Yes Historical Provider, MD  aspirin EC 81 MG tablet Take 81 mg by mouth daily.   Yes Historical Provider, MD  atorvastatin (LIPITOR) 80 MG tablet Take 80 mg by mouth daily.   Yes Historical Provider, MD  benztropine (COGENTIN) 1 MG tablet Take 1 mg by mouth daily.   Yes Historical Provider, MD  camphor-menthol Wynelle Fanny) lotion Apply 1 application topically daily as needed for itching.   Yes Historical Provider, MD  clindamycin (CLEOCIN T) 1 % external solution Apply 1 application topically 2 (two) times daily as needed (for hidradenitis).   Yes Historical Provider, MD  clopidogrel (PLAVIX) 75 MG tablet Take 75 mg by mouth daily.   Yes Historical Provider, MD  Ensure Plus (ENSURE PLUS)  LIQD Take 237 mLs by mouth 2 (two) times daily between meals.   Yes Historical Provider, MD  ferrous sulfate 325 (65 FE) MG tablet Take 325 mg by mouth 2 (two) times daily.   Yes Historical Provider, MD  gabapentin (NEURONTIN) 300 MG capsule Take 600 mg by mouth at bedtime.   Yes Historical Provider, MD  glipiZIDE (GLUCOTROL XL) 10 MG 24 hr tablet Take 10 mg by mouth 2 (two) times daily.   Yes Historical Provider, MD  magnesium oxide (MAG-OX) 400 MG tablet Take 400 mg by mouth 3 (three) times daily.   Yes Historical Provider, MD  mirtazapine  (REMERON) 15 MG tablet Take 15 mg by mouth at bedtime.   Yes Historical Provider, MD  polyethylene glycol (MIRALAX / GLYCOLAX) packet Take 17 g by mouth daily.   Yes Historical Provider, MD  potassium chloride SA (K-DUR,KLOR-CON) 20 MEQ tablet Take 60 mEq by mouth daily.   Yes Historical Provider, MD  QUEtiapine (SEROQUEL) 200 MG tablet Take 200 mg by mouth at bedtime.   Yes Historical Provider, MD  risperiDONE (RISPERDAL) 3 MG tablet Take 3 mg by mouth 2 (two) times daily.   Yes Historical Provider, MD  triamcinolone cream (KENALOG) 0.1 % Apply 1 application topically 2 (two) times daily as needed (for dermatitis).   Yes Historical Provider, MD   Physical Exam: Filed Vitals:   03/30/13 1558 03/30/13 1635 03/30/13 1939  BP: 97/54    Pulse: 104    Temp: 98.4 F (36.9 C)  97.9 F (36.6 C)  TempSrc: Oral  Rectal  Resp: 20    Height:  5\' 5"  (1.651 m)   Weight:  57.153 kg (126 lb)   SpO2: 99%       General:  Well developed and moderately nourished.  Eyes: Anicteric no pallor.  ENT: No discharge from ears eyes nose mouth.  Neck: No mass felt.  Cardiovascular: S1-S2 heard.  Respiratory: No rhonchi or crepitations.  Abdomen: Soft nontender bowel sounds present.  Skin: Patient's groin area has chronic skin changes. I do not see any active discharge at this time as the area has been already cleaned.  Musculoskeletal: No edema.  Psychiatric: Patient lack of affect.  Neurologic: Alert awake oriented to time place and person. Left-sided hemiplegia.  Labs on Admission:  Basic Metabolic Panel:  Recent Labs Lab 03/30/13 1606  NA 132*  K 3.7  CL 94*  CO2 26  GLUCOSE 61*  BUN 6  CREATININE 0.58  CALCIUM 9.1   Liver Function Tests:  Recent Labs Lab 03/30/13 1606  AST 20  ALT 19  ALKPHOS 218*  BILITOT 0.5  PROT 11.4*  ALBUMIN 2.1*   No results found for this basename: LIPASE, AMYLASE,  in the last 168 hours No results found for this basename: AMMONIA,  in the last  168 hours CBC:  Recent Labs Lab 03/30/13 1606  WBC 11.1*  HGB 9.9*  HCT 29.5*  MCV 92.2  PLT 524*   Cardiac Enzymes: No results found for this basename: CKTOTAL, CKMB, CKMBINDEX, TROPONINI,  in the last 168 hours  BNP (last 3 results) No results found for this basename: PROBNP,  in the last 8760 hours CBG: No results found for this basename: GLUCAP,  in the last 168 hours  Radiological Exams on Admission: Dg Chest Port 1 View  03/30/2013   *RADIOLOGY REPORT*  Clinical Data: Cough and weakness  PORTABLE CHEST - 1 VIEW  Comparison: None.  Findings: The patient is rotated slightly to  the left.  Ill-defined left apical airspace opacity overlying the left upper lobe is noted.  Heart size is normal.  The right lung is grossly clear. Lung volumes are low with crowding of the bronchovascular markings. No pleural effusion.  No acute osseous abnormality.  Deformity of the right proximal humerus may indicate Hill-Sachs deformity.  The left proximal humerus is not well visualized but there is cortical irregularity which could indicate degenerative change or fracture but is not further evaluated.  IMPRESSION: Ill-defined left upper lobe airspace opacity which could be artifactual related to rotation and superimposed structures but further evaluation with dedicated PA and lateral chest radiograph is recommended for better visualization.  Cortical irregularity at the left proximal humerus, question degenerative change or age indeterminate fracture.  Correlate for point tenderness and consider dedicated imaging of this area.   Original Report Authenticated By: Christiana Pellant, M.D.    Assessment/Plan Principal Problem:   Cellulitis Active Problems:   CAD (coronary artery disease)   Hidradenitis suppurativa   Diabetes mellitus   Schizophrenia   History of CVA (cerebrovascular accident)   Anemia   Hyperlipidemia   Gout   1. Worsening of Hidradenitis suppurativa with possible infection - CT  abdomen and pelvis is pending. Patient has been started on empiric antibiotics and fluids. Surgery following. 2. Anemia - closely follow CBC. Patient is on antiplatelet agents for his known history of CAD and stenting which was placed last year which will be continued for now. 3. History of CVA with left-sided hemiplegia - continue antiplatelet agents for now. 4. Diabetes mellitus type 2 - continue home medications. 5. History of colon cancer status post resection last year. 6. Schizophrenia - continue home medications. 7. Hyperlipidemia - continue home medications. 8. Gout - continue home medications.    Code Status: Full code.  Family Communication: Patient's sister at the bedside.  Disposition Plan: Admit to inpatient.    Deann Mclaine N. [facility] Pager [phone].  If 7PM-7AM, please contact night-coverage [email or web address] Password [facility] 03/30/2013, 9:46 PM

## 2013-03-30 NOTE — Progress Notes (Signed)
INTERNAL MEDICINE TEACHING SERVICE Night Float Progress Note   Internal Medicine Teaching Service was initially called to request inpatient admission. However, I initially called the patient's nurse to request records. However, the nurse confirmed the pt's enrollment with [facility] (per phone number provided [phone])  their service. He is involved in the PACE program through [facility] in [place]. However, he is NOT a member of [facility], which is the service that is assigned to Internal Medicine Teaching Service.   Therefore, I called Dr. Berline Chough back and left a message with Dondra Spry NP, to clarify that the patient should NOT be assigned to internal medicine teaching service. Instead, should be assigned as is appropriate for [facility].   Thank you.  Priscella Mann, DO  03/30/2013, 9:12 PM

## 2013-03-30 NOTE — Consult Note (Signed)
Reason for Consult:Draining infection of the perineum, buttock, medial thigh Referring Physician: Dr. Kayleen Memos is an 57 y.o. male.  HPI: This is a 57 yo male with a history of colon cancer (unclear site), s/p colectomy, schizophrenia, presents with worsening skin infection of the perineum, buttock, and medial thighs.  He reportedly has had some behavioral and mental status changes, non-ambulatory.  Decreased PO intake.  Reported chills/ rigors.    History of MI with EF 40-45% at [facility] CVA with left hemiparesis 2013.     Past Medical History  Diagnosis Date  . Diabetes mellitus with polyneuropathy   . Blood transfusion without reported diagnosis   . Anemia   . Adenocarcinoma, colon     Colon  . Gout   . CAD (coronary artery disease)   . MI, old   . CVA (cerebral infarction)   . Hemiplegia, post-stroke   . Protein calorie malnutrition   . Stable angina   . Suppurative hidradenitis     ch    Past Surgical History  Procedure Laterality Date  . Cardiac surgery      Stent 2013  . Coloscop    . Status post hemicolectomy      February 2013 at [facility]    History reviewed. No pertinent family history.  Social History:  reports that he has been smoking Cigarettes.  He has been smoking about 0.00 packs per day. He does not have any smokeless tobacco history on file. His alcohol and drug histories are not on file.  Allergies:  Allergies  Allergen Reactions  . Asa (Aspirin)     Medications: Prior to Admission:  Prior to Admission medications   Medication Sig Start Date End Date Taking? Authorizing Provider  acetaminophen (TYLENOL) 650 MG CR tablet Take 650 mg by mouth every 8 (eight) hours as needed for pain.   Yes Historical Provider, MD  allopurinol (ZYLOPRIM) 300 MG tablet Take 300 mg by mouth daily.   Yes Historical Provider, MD  aspirin EC 81 MG tablet Take 81 mg by mouth daily.   Yes Historical Provider, MD  atorvastatin (LIPITOR) 80 MG tablet Take 80 mg by mouth  daily.   Yes Historical Provider, MD  benztropine (COGENTIN) 1 MG tablet Take 1 mg by mouth daily.   Yes Historical Provider, MD  camphor-menthol Wynelle Fanny) lotion Apply 1 application topically daily as needed for itching.   Yes Historical Provider, MD  clindamycin (CLEOCIN T) 1 % external solution Apply 1 application topically 2 (two) times daily as needed (for hidradenitis).   Yes Historical Provider, MD  clopidogrel (PLAVIX) 75 MG tablet Take 75 mg by mouth daily.   Yes Historical Provider, MD  Ensure Plus (ENSURE PLUS) LIQD Take 237 mLs by mouth 2 (two) times daily between meals.   Yes Historical Provider, MD  ferrous sulfate 325 (65 FE) MG tablet Take 325 mg by mouth 2 (two) times daily.   Yes Historical Provider, MD  gabapentin (NEURONTIN) 300 MG capsule Take 600 mg by mouth at bedtime.   Yes Historical Provider, MD  glipiZIDE (GLUCOTROL XL) 10 MG 24 hr tablet Take 10 mg by mouth 2 (two) times daily.   Yes Historical Provider, MD  magnesium oxide (MAG-OX) 400 MG tablet Take 400 mg by mouth 3 (three) times daily.   Yes Historical Provider, MD  mirtazapine (REMERON) 15 MG tablet Take 15 mg by mouth at bedtime.   Yes Historical Provider, MD  polyethylene glycol (MIRALAX / GLYCOLAX) packet Take 17 g  by mouth daily.   Yes Historical Provider, MD  potassium chloride SA (K-DUR,KLOR-CON) 20 MEQ tablet Take 60 mEq by mouth daily.   Yes Historical Provider, MD  QUEtiapine (SEROQUEL) 200 MG tablet Take 200 mg by mouth at bedtime.   Yes Historical Provider, MD  risperiDONE (RISPERDAL) 3 MG tablet Take 3 mg by mouth 2 (two) times daily.   Yes Historical Provider, MD  triamcinolone cream (KENALOG) 0.1 % Apply 1 application topically 2 (two) times daily as needed (for dermatitis).   Yes Historical Provider, MD     Results for orders placed during the hospital encounter of 03/30/13 (from the past 48 hour(s))  CBC     Status: Abnormal   Collection Time    03/30/13  4:06 PM      Result Value Range   WBC  11.1 (*) 4.0 - 10.5 K/uL   RBC 3.20 (*) 4.22 - 5.81 MIL/uL   Hemoglobin 9.9 (*) 13.0 - 17.0 g/dL   HCT 16.1 (*) 09.6 - 04.5 %   MCV 92.2  78.0 - 100.0 fL   MCH 30.9  26.0 - 34.0 pg   MCHC 33.6  30.0 - 36.0 g/dL   RDW 40.9  81.1 - 91.4 %   Platelets 524 (*) 150 - 400 K/uL  COMPREHENSIVE METABOLIC PANEL     Status: Abnormal   Collection Time    03/30/13  4:06 PM      Result Value Range   Sodium 132 (*) 135 - 145 mEq/L   Potassium 3.7  3.5 - 5.1 mEq/L   Chloride 94 (*) 96 - 112 mEq/L   CO2 26  19 - 32 mEq/L   Glucose, Bld 61 (*) 70 - 99 mg/dL   BUN 6  6 - 23 mg/dL   Creatinine, Ser 7.82  0.50 - 1.35 mg/dL   Calcium 9.1  8.4 - 95.6 mg/dL   Total Protein 21.3 (*) 6.0 - 8.3 g/dL   Albumin 2.1 (*) 3.5 - 5.2 g/dL   AST 20  0 - 37 U/L   ALT 19  0 - 53 U/L   Alkaline Phosphatase 218 (*) 39 - 117 U/L   Total Bilirubin 0.5  0.3 - 1.2 mg/dL   GFR calc non Af Amer >90  >90 mL/min   GFR calc Af Amer >90  >90 mL/min   Comment:            The eGFR has been calculated     using the CKD EPI equation.     This calculation has not been     validated in all clinical     situations.     eGFR's persistently     <90 mL/min signify     possible Chronic Kidney Disease.  TYPE AND SCREEN     Status: None   Collection Time    03/30/13  6:00 PM      Result Value Range   ABO/RH(D) A POS     Antibody Screen NEG     Sample Expiration 04/02/2013    PROCALCITONIN     Status: None   Collection Time    03/30/13  7:00 PM      Result Value Range   Procalcitonin 0.22     Comment:            Interpretation:     PCT (Procalcitonin) <= 0.5 ng/mL:     Systemic infection (sepsis) is not likely.     Local bacterial infection is possible.     (  NOTE)             ICU PCT Algorithm               Non ICU PCT Algorithm        ----------------------------     ------------------------------             PCT < 0.25 ng/mL                 PCT < 0.1 ng/mL         Stopping of antibiotics            Stopping of  antibiotics           strongly encouraged.               strongly encouraged.        ----------------------------     ------------------------------           PCT level decrease by               PCT < 0.25 ng/mL           >= 80% from peak PCT           OR PCT 0.25 - 0.5 ng/mL          Stopping of antibiotics                                                 encouraged.         Stopping of antibiotics               encouraged.        ----------------------------     ------------------------------           PCT level decrease by              PCT >= 0.25 ng/mL           < 80% from peak PCT            AND PCT >= 0.5 ng/mL            Continuing antibiotics                                                  encouraged.           Continuing antibiotics                encouraged.        ----------------------------     ------------------------------         PCT level increase compared          PCT > 0.5 ng/mL             with peak PCT AND              PCT >= 0.5 ng/mL             Escalation of antibiotics                                              strongly encouraged.  Escalation of antibiotics            strongly encouraged.  POCT I-STAT TROPONIN I     Status: None   Collection Time    03/30/13  7:10 PM      Result Value Range   Troponin i, poc 0.00  0.00 - 0.08 ng/mL   Comment 3            Comment: Due to the release kinetics of cTnI,     a negative result within the first hours     of the onset of symptoms does not rule out     myocardial infarction with certainty.     If myocardial infarction is still suspected,     repeat the test at appropriate intervals.  CG4 I-STAT (LACTIC ACID)     Status: None   Collection Time    03/30/13  7:12 PM      Result Value Range   Lactic Acid, Venous 2.20  0.5 - 2.2 mmol/L    Dg Chest Port 1 View  03/30/2013   *RADIOLOGY REPORT*  Clinical Data: Cough and weakness  PORTABLE CHEST - 1 VIEW  Comparison: None.  Findings: The patient is rotated  slightly to the left.  Ill-defined left apical airspace opacity overlying the left upper lobe is noted.  Heart size is normal.  The right lung is grossly clear. Lung volumes are low with crowding of the bronchovascular markings. No pleural effusion.  No acute osseous abnormality.  Deformity of the right proximal humerus may indicate Hill-Sachs deformity.  The left proximal humerus is not well visualized but there is cortical irregularity which could indicate degenerative change or fracture but is not further evaluated.  IMPRESSION: Ill-defined left upper lobe airspace opacity which could be artifactual related to rotation and superimposed structures but further evaluation with dedicated PA and lateral chest radiograph is recommended for better visualization.  Cortical irregularity at the left proximal humerus, question degenerative change or age indeterminate fracture.  Correlate for point tenderness and consider dedicated imaging of this area.   Original Report Authenticated By: Christiana Pellant, M.D.    ROS Blood pressure 97/54, pulse 104, temperature 97.9 F (36.6 C), temperature source Rectal, resp. rate 20, height 5\' 5"  (1.651 m), weight 126 lb (57.153 kg), SpO2 99.00%. Physical Exam Thicken induration of the left buttock, bilateral perineum, proximal medial thighs with purulent drainage through multiple small pinholes.  No obvious tenderness.  No obvious fluctuance.     Assessment/Plan: Severe hidradenitis of the perineum, buttock, and proximal thighs  Recs: CT scan to rule out deeper undrained abscesses Keep NPO in case patient might need surgical drainage Broad spectrum antibiotics (Vanc/ Zosyn)  Will follow-up  Xavier Willis K. 03/30/2013, 9:17 PM

## 2013-03-30 NOTE — ED Notes (Signed)
Family reports that pt has hx of boils to his rectum. States that he has rectal bleeding over the past couple of week. Family reports that he has not been able to hold his stool, and has been incontinent. Pt denies any pain. Family reports that the blood is dark red. Pt has hx of blood transfusion.

## 2013-03-30 NOTE — ED Provider Notes (Signed)
History    CSN: [id number] Arrival date & time 03/30/13  1530  First MD Initiated Contact with Patient 03/30/13 1616    Chief Complaint  Patient presents with  . Rectal Bleeding   HPI Comments: Patient is a 57 year old male who is a new [facility] of the [facility] due to schizophrenia and a complex medical history.  He lives with his sister who is his guardian.  She brings Mr. Laural Benes emergency department today to be evaluated for supprative hidradenitis which shereports assignificantly worsened over the past one week.  She reports that over the past one week he has been having significant changes in his behavior behaviors, has become withdrawn, less interactive, more emotionally labile, lays in bed, does not ambulate.  Patient has reportedly had decreased by mouth intake has appeared diaphoretic reports chills and rigors.  He has had no nausea or vomiting.  Does have a history significant for coronary artery disease status post MI with an EF of 40-45% per echocardiogram at [facility] in June of 2013.  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  He has a history of colon cancer status post colectomy.  History of UPEP M-spike.  Has been followed at [facility] previously.   Past Medical History  Diagnosis Date  . Diabetes mellitus with polyneuropathy   . Blood transfusion without reported diagnosis   . Anemia   . Adenocarcinoma, colon     Colon  . Gout   . CAD (coronary artery disease)   . MI, old   . CVA (cerebral infarction)   . Hemiplegia, post-stroke   . Protein calorie malnutrition   . Stable angina   . Suppurative hidradenitis     ch    Past Surgical History  Procedure Laterality Date  . Cardiac surgery      Stent 2013  . Coloscop    . Status post hemicolectomy      February 2013 at [facility]   History reviewed. No pertinent family history.  History  Substance Use Topics  . Smoking status: Current Every Day Smoker    Types: Cigarettes  . Smokeless tobacco: Not on  file  . Alcohol Use: Not on file    Review of Systems  Unable to perform ROS Constitutional: Positive for chills, diaphoresis, activity change and fatigue. Negative for fever (none noted at home, but temperature not taken.) and appetite change (anorexia).  HENT: Negative.  Negative for congestion and neck stiffness.        Limited history obtained  Eyes: Negative.   Respiratory: Positive for cough (small productive chronic). Negative for choking, chest tightness, shortness of breath and wheezing.   Cardiovascular: Negative for chest pain and palpitations.  Gastrointestinal: Positive for blood in stool (has had chronic bleeding but felt to be more so draining from his abscesses). Negative for nausea, abdominal pain, diarrhea, constipation, abdominal distention and anal bleeding.  Endocrine: Positive for cold intolerance (reporting being cold for the past week.).  Genitourinary: Negative.   Musculoskeletal: Negative.   Skin: Positive for wound (diffuse suppurative hydranitis).  Allergic/Immunologic: Negative.   Neurological: Positive for weakness. Negative for seizures and syncope.  Hematological: Negative.   Psychiatric/Behavioral:       Stable for psychiatric symptoms; has had improved compliance with his schizophrenia medications.    Allergies  Asa  Home Medications   Current Outpatient Rx  Name  Route  Sig  Dispense  Refill  . acetaminophen (TYLENOL) 650 MG CR tablet   Oral  Take 650 mg by mouth every 8 (eight) hours as needed for pain.         Marland Kitchen allopurinol (ZYLOPRIM) 300 MG tablet   Oral   Take 300 mg by mouth daily.         Marland Kitchen aspirin EC 81 MG tablet   Oral   Take 81 mg by mouth daily.         Marland Kitchen atorvastatin (LIPITOR) 80 MG tablet   Oral   Take 80 mg by mouth daily.         . benztropine (COGENTIN) 1 MG tablet   Oral   Take 1 mg by mouth daily.         . camphor-menthol (SARNA) lotion   Topical   Apply 1 application topically daily as needed for  itching.         . clindamycin (CLEOCIN T) 1 % external solution   Topical   Apply 1 application topically 2 (two) times daily as needed (for hidradenitis).         . clopidogrel (PLAVIX) 75 MG tablet   Oral   Take 75 mg by mouth daily.         . Ensure Plus (ENSURE PLUS) LIQD   Oral   Take 237 mLs by mouth 2 (two) times daily between meals.         . ferrous sulfate 325 (65 FE) MG tablet   Oral   Take 325 mg by mouth 2 (two) times daily.         Marland Kitchen gabapentin (NEURONTIN) 300 MG capsule   Oral   Take 600 mg by mouth at bedtime.         Marland Kitchen glipiZIDE (GLUCOTROL XL) 10 MG 24 hr tablet   Oral   Take 10 mg by mouth 2 (two) times daily.         . magnesium oxide (MAG-OX) 400 MG tablet   Oral   Take 400 mg by mouth 3 (three) times daily.         . mirtazapine (REMERON) 15 MG tablet   Oral   Take 15 mg by mouth at bedtime.         . polyethylene glycol (MIRALAX / GLYCOLAX) packet   Oral   Take 17 g by mouth daily.         . potassium chloride SA (K-DUR,KLOR-CON) 20 MEQ tablet   Oral   Take 60 mEq by mouth daily.         . QUEtiapine (SEROQUEL) 200 MG tablet   Oral   Take 200 mg by mouth at bedtime.         . risperiDONE (RISPERDAL) 3 MG tablet   Oral   Take 3 mg by mouth 2 (two) times daily.         Marland Kitchen triamcinolone cream (KENALOG) 0.1 %   Topical   Apply 1 application topically 2 (two) times daily as needed (for dermatitis).          BP 97/54  Pulse 104  Temp(Src) 97.9 F (36.6 C) (Rectal)  Resp 20  Ht 5\' 5"  (1.651 m)  Wt 126 lb (57.153 kg)  BMI 20.97 kg/m2  SpO2 99%  Physical Exam  Nursing note and vitals reviewed. Constitutional: He appears well-developed and well-nourished. No distress.  HENT:  Head: Normocephalic and atraumatic.  Dry mucous membranes, poor dentention.  No scleral icterus  Eyes: Conjunctivae are normal. Right eye exhibits no discharge. Left eye exhibits no discharge.  No scleral icterus.  Neck: No JVD  present. No tracheal deviation present.  Cardiovascular: Regular rhythm, normal heart sounds and intact distal pulses.  Tachycardia present.  PMI is displaced.  Exam reveals no gallop and no friction rub.   No murmur heard. Pulses:      Radial pulses are 1+ on the right side, and 1+ on the left side.       Dorsalis pedis pulses are 1+ on the right side, and 1+ on the left side.       Posterior tibial pulses are 1+ on the right side, and 1+ on the left side.  Pulmonary/Chest: Effort normal and breath sounds normal. No accessory muscle usage. No respiratory distress. He has no wheezes. He has no rales.  Abdominal: Soft. He exhibits no distension and no mass. There is no hepatosplenomegaly. There is no tenderness. There is no rigidity, no rebound, no guarding and no CVA tenderness.  Well healed surgical scar  Genitourinary: Rectal exam shows tenderness.     Patient is noted to have profound macerated changes of the lateral groin and gluteal regions.  There's a very large draining area coming from the left buttock it is draining sanguinous and pustular fluid.  Multiple large areas of induration on the perineum and buttock bilaterally.  Musculoskeletal: Normal range of motion. He exhibits no edema.  Neurological: He is alert. He exhibits normal muscle tone.  Skin: Skin is warm. Rash noted. Rash is nodular and pustular. He is diaphoretic. No cyanosis or erythema. No pallor.  See GU section.  In addition there are bilateral axillary scarring secondary to suppurative hidradenitis.    Psychiatric: His affect is blunt and labile. His speech is delayed. He is agitated, slowed and withdrawn. Cognition and memory are impaired. He expresses impulsivity. He is inattentive.    ED Course  Procedures (including critical care time)  Labs Reviewed  CBC - Abnormal; Notable for the following:    WBC 11.1 (*)    RBC 3.20 (*)    Hemoglobin 9.9 (*)    HCT 29.5 (*)    Platelets 524 (*)    All other components  within normal limits  COMPREHENSIVE METABOLIC PANEL - Abnormal; Notable for the following:    Sodium 132 (*)    Chloride 94 (*)    Glucose, Bld 61 (*)    Total Protein 11.4 (*)    Albumin 2.1 (*)    Alkaline Phosphatase 218 (*)    All other components within normal limits  CULTURE, BLOOD (ROUTINE X 2)  CULTURE, BLOOD (ROUTINE X 2)  URINE CULTURE  GRAM STAIN  PROCALCITONIN  URINALYSIS, ROUTINE W REFLEX MICROSCOPIC  CG4 I-STAT (LACTIC ACID)  POCT I-STAT TROPONIN I  TYPE AND SCREEN  ABO/RH   Dg Chest Port 1 View  03/30/2013   *RADIOLOGY REPORT*  Clinical Data: Cough and weakness  PORTABLE CHEST - 1 VIEW  Comparison: None.  Findings: The patient is rotated slightly to the left.  Ill-defined left apical airspace opacity overlying the left upper lobe is noted.  Heart size is normal.  The right lung is grossly clear. Lung volumes are low with crowding of the bronchovascular markings. No pleural effusion.  No acute osseous abnormality.  Deformity of the right proximal humerus may indicate Hill-Sachs deformity.  The left proximal humerus is not well visualized but there is cortical irregularity which could indicate degenerative change or fracture but is not further evaluated.  IMPRESSION: Ill-defined left upper lobe airspace opacity which could be  artifactual related to rotation and superimposed structures but further evaluation with dedicated PA and lateral chest radiograph is recommended for better visualization.  Cortical irregularity at the left proximal humerus, question degenerative change or age indeterminate fracture.  Correlate for point tenderness and consider dedicated imaging of this area.   Original Report Authenticated By: Christiana Pellant, M.D.    Date: 03/30/2013  Rate: 99  Rhythm: normal sinus rhythm  QRS Axis: normal  Intervals: normal  ST/T Wave abnormalities: normal  Conduction Disutrbances: none  Narrative Interpretation: Q wave in lead III  No diagnosis found.   MDM    1725 - on initial evaluation the patient patient was noted to be tachycardic, tachypnea to rate of approximately 20 and diaphoretic.  Admitting service criteria and profound involvement of scrotal and perineal area will initiate sepsis protocol and further evaluate the patient.  I have talked with Durel Salts with the [facility] - contact # [phone] and they are aware he is here.  Given meeting sepsis criteria will further work up infectious sources but there is concern that he may have improved his compliance with his schizophrenia medications and may be reacting to therapeutic levels of his Risperdal.  Patient is noted to have a history of congestive heart failure with an EF of 40-45% and June of 2013; we will start with 1 L normal saline bolus.  1645 - Pts HR has improved but pt is refusing some of his lab work.  Second blood culture has not been collected and pt is refusing.  Family has left and has not returned, No contact information available.  1900 - patient has agreed to have labs drawn.  Repeat blood pressures, temperature, EKG pending.   0981 - was able better to evaluate his perineal region.  Diffusely macerated areas of chronically scarred tissue from chronic suppurative hydrinitis. .  Multiple open draining areas of pustular material from the bilateral thighs.  Patient is noted to be in a soiled diaper from home.  I did discover a very large draining abscess on the left gluteal region.  Probes to approximately 1 cm.  Draining serosanguineous and pustular fluid.  Will page general surgery to evaluate the patient for consideration of debridement.  Continuing code sepsis, emeperic antibiotics being initiated now.  2000 - Plan to admit to [facility] to SDU.  Continue empiric antibiotics until blood cultures returned negative.  Continued monitor for signs and symptoms of sepsis.  Patient's tachypnea has resolved, heart rate is still elevated to the low 100s but upon further chart  review patient does have a past medical history significant for tachycardia.  Blood pressure has improved.  Pending call back for general surgery and [facility].  2100 - discussed with Dr. Toniann Fail who will admit the patient to step down unit.  General surgery will see the patient.  Will obtain a CT  of the abdomen and pelvis to further evaluate for deep infection given draining abscesses typically do not cause systemic symptoms and concern for deeper infection especially in the setting of prior significant abdominal surgery with known history of multiple colonic polyps and resections.  Andrena Mews, DO 03/30/13 2124

## 2013-03-31 ENCOUNTER — Encounter (HOSPITAL_COMMUNITY): Payer: Self-pay | Admitting: Radiology

## 2013-03-31 ENCOUNTER — Inpatient Hospital Stay (HOSPITAL_COMMUNITY): Payer: Medicare (Managed Care)

## 2013-03-31 DIAGNOSIS — F0390 Unspecified dementia without behavioral disturbance: Secondary | ICD-10-CM | POA: Diagnosis present

## 2013-03-31 DIAGNOSIS — F209 Schizophrenia, unspecified: Secondary | ICD-10-CM

## 2013-03-31 LAB — COMPREHENSIVE METABOLIC PANEL
Albumin: 1.6 g/dL — ABNORMAL LOW (ref 3.5–5.2)
Alkaline Phosphatase: 172 U/L — ABNORMAL HIGH (ref 39–117)
BUN: 4 mg/dL — ABNORMAL LOW (ref 6–23)
Creatinine, Ser: 0.54 mg/dL (ref 0.50–1.35)
GFR calc Af Amer: >90 mL/min (ref >90)
Glucose, Bld: 47 mg/dL — ABNORMAL LOW (ref 70–99)
Potassium: 3.6 mEq/L (ref 3.5–5.1)
Total Bilirubin: 0.5 mg/dL (ref 0.3–1.2)
Total Protein: 8.9 g/dL — ABNORMAL HIGH (ref 6.0–8.3)

## 2013-03-31 LAB — GLUCOSE, CAPILLARY
Glucose-Capillary: 29 mg/dL — CL (ref 70–99)
Glucose-Capillary: 159 mg/dL — ABNORMAL HIGH (ref 70–99)

## 2013-03-31 LAB — CBC
Platelets: 476 10*3/uL — ABNORMAL HIGH (ref 150–400)
RBC: 3.01 MIL/uL — ABNORMAL LOW (ref 4.22–5.81)
RDW: 15.1 % (ref 11.5–15.5)
WBC: 10.8 10*3/uL — ABNORMAL HIGH (ref 4.0–10.5)

## 2013-03-31 LAB — MRSA PCR SCREENING: MRSA by PCR: NEGATIVE

## 2013-03-31 MED ORDER — SODIUM CHLORIDE 0.9 % IV SOLN
INTRAVENOUS | Status: DC
  Administered 2013-04-01 (×2): via INTRAVENOUS

## 2013-03-31 MED ORDER — DEXTROSE 50 % IV SOLN: 50.0000 mL | Freq: Once | INTRAVENOUS | Status: AC | PRN

## 2013-03-31 MED ORDER — DEXTROSE 50 % IV SOLN
INTRAVENOUS | Status: AC
  Filled 2013-03-31: qty 50

## 2013-03-31 MED ORDER — ADULT MULTIVITAMIN W/MINERALS CH
1.0000 | ORAL_TABLET | Freq: Every day | ORAL | Status: DC
  Administered 2013-04-01 – 2013-04-10 (×10): 1 via ORAL
  Filled 2013-03-31 (×11): qty 1

## 2013-03-31 MED ORDER — IOHEXOL 300 MG/ML  SOLN
100.0000 mL | Freq: Once | INTRAMUSCULAR | Status: AC | PRN
  Administered 2013-03-31: 100 mL via INTRAVENOUS

## 2013-03-31 NOTE — Progress Notes (Signed)
Hypoglycemic Event  CBG: 29  Treatment: IV Dextrose   Symptoms: sleepy   Follow-up CBG: Time 0715 CBG Result: 172 Possible Reasons for Event: inadeqate PO intake?  Comments/MD notified     Kalman Jewels Lambert Keto  Remember to initiate Hypoglycemia Order Set & complete

## 2013-03-31 NOTE — Progress Notes (Signed)
INITIAL NUTRITION ASSESSMENT  DOCUMENTATION CODES Per approved criteria  -Not Applicable   INTERVENTION: Provide Multivitamin with minerals daily Diet advancement per MD discretion Add supplements when diet advanced; Glucerna BID and Juven BID   NUTRITION DIAGNOSIS: Increased nutrient needs related to wound healing as evidenced by worsening of Hidradenitis suppurativa with possible infection with purulent drainage through multiple small sites consistent with hidradenitis  Goal: Pt to meet >/= 90% of their estimated nutrition needs  Monitor:  PO intake Weight Signs of wound healing  Reason for Assessment: malnutrition screening tool, score of 3  57 y.o. male  Admitting Dx: Cellulitis  ASSESSMENT: 57 y.o. male with known history of hidranitis suppuritiva, CAD status post stenting, colon cancer status post surgery last year, CVA with left-sided hemiplegia, schizophrenia, chronic anemia was found to have increasing bleeding from his groin area where patient has hidranitis suppuritiva.   Pt asleep and unresponsive at time of visit. No family present and no weight history in chart. Pt is currently NPO.  Height: Ht Readings from Last 1 Encounters:  03/30/13 5\' 4"  (1.626 m)    Weight: Wt Readings from Last 1 Encounters:  03/31/13 121 lb 14.6 oz (55.3 kg)    Ideal Body Weight: 130 lbs  % Ideal Body Weight: 93%  Wt Readings from Last 10 Encounters:  03/31/13 121 lb 14.6 oz (55.3 kg)    Usual Body Weight: unknown  % Usual Body Weight: NA  BMI:  Body mass index is 20.92 kg/(m^2).  Estimated Nutritional Needs: Kcal: 4540-9811 Protein: 72-83 grams Fluid: 2 L  Skin: weeping, cellulitis on buttocks and groin  Diet Order: NPO  EDUCATION NEEDS: -No education needs identified at this time   Intake/Output Summary (Last 24 hours) at 03/31/13 1052 Last data filed at 03/31/13 0600  Gross per 24 hour  Intake      0 ml  Output   1575 ml  Net  -1575 ml    Last BM:  5/23  Labs:   Recent Labs Lab 03/30/13 1606 03/31/13 0530  NA 132* 134*  K 3.7 3.6  CL 94* 101  CO2 26 23  BUN 6 4*  CREATININE 0.58 0.54  CALCIUM 9.1 8.4  GLUCOSE 61* 47*    CBG (last 3)   Recent Labs  03/31/13 0647 03/31/13 0718  GLUCAP 29* 172*    Scheduled Meds: . allopurinol  300 mg Oral Daily  . aspirin EC  81 mg Oral Daily  . atorvastatin  80 mg Oral Daily  . benztropine  1 mg Oral Daily  . clopidogrel  75 mg Oral Daily  . Ensure Plus  237 mL Oral BID BM  . ferrous sulfate  325 mg Oral BID  . gabapentin  600 mg Oral QHS  . insulin aspart  0-9 Units Subcutaneous TID WC  . magnesium oxide  400 mg Oral TID  . mirtazapine  15 mg Oral QHS  . piperacillin-tazobactam (ZOSYN)  IV  3.375 g Intravenous Q8H  . polyethylene glycol  17 g Oral Daily  . potassium chloride SA  60 mEq Oral Daily  . QUEtiapine  200 mg Oral QHS  . risperiDONE  3 mg Oral BID  . sodium chloride  3 mL Intravenous Q12H  . vancomycin  1,000 mg Intravenous Q12H    Continuous Infusions: . sodium chloride 125 mL/hr at 03/31/13 9147    Past Medical History  Diagnosis Date  . Diabetes mellitus with polyneuropathy   . Blood transfusion without reported diagnosis   .  Anemia   . Adenocarcinoma, colon     Colon  . Gout   . CAD (coronary artery disease)   . MI, old   . CVA (cerebral infarction)   . Hemiplegia, post-stroke   . Protein calorie malnutrition   . Stable angina   . Suppurative hidradenitis     ch    Past Surgical History  Procedure Laterality Date  . Cardiac surgery      Stent 2013  . Coloscop    . Status post hemicolectomy      February 2013 at [facility] RD, LDN Inpatient Clinical Dietitian Pager: [phone] After Hours Pager: [phone]

## 2013-03-31 NOTE — Progress Notes (Signed)
Subjective: Difficult to arouse, nonverbal  Objective: Vital signs in last 24 hours: Temp:  [97.3 F (36.3 C)-98.4 F (36.9 C)] 97.3 F (36.3 C) (05/24 0634) Pulse Rate:  [88-104] 94 (05/24 0634) Resp:  [18-20] 18 (05/24 0634) BP: (97-126)/(54-72) 106/60 mmHg (05/24 0634) SpO2:  [99 %-100 %] 100 % (05/24 0634) Weight:  [121 lb 14.6 oz (55.3 kg)-126 lb (57.153 kg)] 121 lb 14.6 oz (55.3 kg) (05/24 0634) Last BM Date: 03/30/13  Intake/Output from previous day: 05/23 0701 - 05/24 0700 In: -  Out: 1575 [Urine:1575] Intake/Output this shift:   Indurated left buttock without obvious fluctuance, bilateral perineum, proximal medial thighs with purulent drainage through multiple small sites consistent with hidradenitis. Nontender but his exam not really reliable given mental status  Lab Results:   Recent Labs  03/30/13 2214 03/31/13 0530  WBC 10.7* 10.8*  HGB 9.0* 8.9*  HCT 27.6* 27.7*  PLT 479* 476*   BMET  Recent Labs  03/30/13 1606 03/31/13 0530  NA 132* 134*  K 3.7 3.6  CL 94* 101  CO2 26 23  GLUCOSE 61* 47*  BUN 6 4*  CREATININE 0.58 0.54  CALCIUM 9.1 8.4    Studies/Results: Ct Abdomen Pelvis W Contrast  03/31/2013   *RADIOLOGY REPORT*  Clinical Data: Rectal bleeding, history of colon cancer  CT ABDOMEN AND PELVIS WITH CONTRAST  Technique:  Multidetector CT imaging of the abdomen and pelvis was performed following the standard protocol during bolus administration of intravenous contrast.  Contrast: OMNIPAQUE IOHEXOL 300 MG/ML  SOLN  Comparison: No similar prior study is available for comparison.  Findings: Trace right pleural effusion noted.  Coronary arterial stent or calcification noted.  Lung bases are otherwise clear with the exception of minimal subpleural presumed atelectasis.  Gallstones noted without other CT evidence for acute cholecystitis. Liver, right kidney, adrenal glands, spleen, and pancreas are normal.  Too small to characterize sub  centimeter left renal cortical hypodensities are noted, statistically most likely cysts. Moderate atheromatous aortic calcification without aneurysm.  There is diffuse skin thickening and subjacent fat stranding involving the left buttock which is incompletely imaged.  There are areas of subcutaneous gas within this area of the, for example image 87.  No bowel wall thickening or focal segmental dilatation is identified.  Lumbar spine degenerative change is noted.  IMPRESSION: Diffuse skin thickening, subjacent fat stranding, and gas within the superficial soft tissue of the left buttock, most compatible with decubitus ulcer versus abscess in the absence of trauma.   Original Report Authenticated By: Christiana Pellant, M.D.   Dg Chest Port 1 View  03/30/2013   *RADIOLOGY REPORT*  Clinical Data: Cough and weakness  PORTABLE CHEST - 1 VIEW  Comparison: None.  Findings: The patient is rotated slightly to the left.  Ill-defined left apical airspace opacity overlying the left upper lobe is noted.  Heart size is normal.  The right lung is grossly clear. Lung volumes are low with crowding of the bronchovascular markings. No pleural effusion.  No acute osseous abnormality.  Deformity of the right proximal humerus may indicate Hill-Sachs deformity.  The left proximal humerus is not well visualized but there is cortical irregularity which could indicate degenerative change or fracture but is not further evaluated.  IMPRESSION: Ill-defined left upper lobe airspace opacity which could be artifactual related to rotation and superimposed structures but further evaluation with dedicated PA and lateral chest radiograph is recommended for better visualization.  Cortical irregularity at the left proximal humerus, question degenerative  change or age indeterminate fracture.  Correlate for point tenderness and consider dedicated imaging of this area.   Original Report Authenticated By: Christiana Pellant, M.D.     Anti-infectives: Anti-infectives   Start     Dose/Rate Route Frequency Ordered Stop   03/31/13 0800  vancomycin (VANCOCIN) IVPB 1000 mg/200 mL premix     1,000 mg 200 mL/hr over 60 Minutes Intravenous Every 12 hours 03/30/13 1835     03/30/13 2200  piperacillin-tazobactam (ZOSYN) IVPB 3.375 g     3.375 g 12.5 mL/hr over 240 Minutes Intravenous 3 times per day 03/30/13 1835     03/30/13 1730  piperacillin-tazobactam (ZOSYN) IVPB 3.375 g     3.375 g 100 mL/hr over 30 Minutes Intravenous  Once 03/30/13 1724 03/30/13 2106   03/30/13 1730  vancomycin (VANCOCIN) IVPB 1000 mg/200 mL premix     1,000 mg 200 mL/hr over 60 Minutes Intravenous  Once 03/30/13 1724 03/30/13 2206      Assessment/Plan: Hidradenitis  The left buttock may benefit from drainage but no one to consent or discuss with right now.  Will talk to family if they arrive but given comorbidites and his issues may be reasonable to try abx a little longer as well.  This is a chronic process with acute worsening  Jerrell Hart 03/31/2013

## 2013-03-31 NOTE — Progress Notes (Signed)
Utilization review completed.  P.J. Aviyon Hocevar,RN,BSN Case Manager [phone]  

## 2013-03-31 NOTE — Progress Notes (Addendum)
[facility] PROGRESS NOTE  Xavier Khan ZOX:[id number] DOB: 1956/04/23 DOA: 03/30/2013 PCP: No PCP Per Patient  Assessment/Plan: 1. Hidradenitis suppurativa, Acute on Chronic infection - CT pelvis noted -continue IV Vanc/Zosyn, Day 2 -CCS following, will need debridement -have asked sister to be present 5/24 in am to d/w Surgeons -resume diet, till surgery planned  2. Anemia - stable, check anemia panel.  3. History of CVA with left-sided hemiplegia - continue plavix  4. Diabetes mellitus type 2 - with hypoglycemic episodes -stop oral hypoglycemics, SSI  5. History of colon cancer status post resection last year.  6. Dementia: stable, ongoing cognitive and functional decline for a while per sister  7. Schizophrenia - continue cogentin, seroquel and risperdal.  8.    Hyperlipidemia - continue statin  9.     Gout - continue allopurinol.  Code Status: FULL Family Communication: called and d/w sister and asked her to be present tomorrow am to talk to [facility]CS about surgery Disposition Plan: to be determined   Consultants:  CCS  Antibiotics:  Vanc 5/23  Zosyn 5/23  HPI/Subjective: Denies any complaints, but does not talk/volunteeer information  Objective: Filed Vitals:   03/30/13 1635 03/30/13 1939 03/30/13 2300 03/31/13 0634  BP:   126/72 106/60  Pulse:   88 94  Temp:  97.9 F (36.6 C) 98.1 F (36.7 C) 97.3 F (36.3 C)  TempSrc:  Rectal Oral Axillary  Resp:   18 18  Height: 5\' 5"  (1.651 m)  5\' 4"  (1.626 m)   Weight: 57.153 kg (126 lb)  56.4 kg (124 lb 5.4 oz) 55.3 kg (121 lb 14.6 oz)  SpO2:   100% 100%    Intake/Output Summary (Last 24 hours) at 03/31/13 1410 Last data filed at 03/31/13 0600  Gross per 24 hour  Intake      0 ml  Output   1575 ml  Net  -1575 ml   Filed Weights   03/30/13 1635 03/30/13 2300 03/31/13 0634  Weight: 57.153 kg (126 lb) 56.4 kg (124 lb 5.4 oz) 55.3 kg (121 lb 14.6 oz)    Exam:   General:  Alert, awake,  does not  converse  Cardiovascular: S1S2/RRR  Respiratory: CTAB, diminished at bases  Abdomen: soft, NT, BS present Perineum: Thick, induration of the left buttock, bilateral perineum, proximal medial thighs with some purulence       Data Reviewed: Basic Metabolic Panel:  Recent Labs Lab 03/30/13 1606 03/31/13 0530  NA 132* 134*  K 3.7 3.6  CL 94* 101  CO2 26 23  GLUCOSE 61* 47*  BUN 6 4*  CREATININE 0.58 0.54  CALCIUM 9.1 8.4   Liver Function Tests:  Recent Labs Lab 03/30/13 1606 03/31/13 0530  AST 20 15  ALT 19 14  ALKPHOS 218* 172*  BILITOT 0.5 0.5  PROT 11.4* 8.9*  ALBUMIN 2.1* 1.6*   No results found for this basename: LIPASE, AMYLASE,  in the last 168 hours No results found for this basename: AMMONIA,  in the last 168 hours CBC:  Recent Labs Lab 03/30/13 1606 03/30/13 2214 03/31/13 0530  WBC 11.1* 10.7* 10.8*  HGB 9.9* 9.0* 8.9*  HCT 29.5* 27.6* 27.7*  MCV 92.2 91.7 92.0  PLT 524* 479* 476*   Cardiac Enzymes: No results found for this basename: CKTOTAL, CKMB, CKMBINDEX, TROPONINI,  in the last 168 hours BNP (last 3 results) No results found for this basename: PROBNP,  in the last 8760 hours CBG:  Recent Labs Lab 03/31/13 0647 03/31/13  1610 03/31/13 1059  GLUCAP 29* 172* 52*    Recent Results (from the past 240 hour(s))  GRAM STAIN     Status: None   Collection Time    03/30/13  8:59 PM      Result Value Range Status   Specimen Description URINE, CLEAN CATCH   Final   Special Requests NONE   Final   Gram Stain     Final   Value: CYTOSPIN     Neg for bacteria     WBC PRESENT,BOTH PMN AND MONONUCLEAR Only 2 cells seen on slide     Results Called toRayburn Ma, RN [name]   Report Status 03/30/2013 FINAL   Final  MRSA PCR SCREENING     Status: None   Collection Time    03/31/13 12:17 AM      Result Value Range Status   MRSA by PCR NEGATIVE  NEGATIVE Final   Comment:            The GeneXpert MRSA Assay (FDA     approved for  NASAL specimens     only), is one component of a     comprehensive MRSA colonization     surveillance program. It is not     intended to diagnose MRSA     infection nor to guide or     monitor treatment for     MRSA infections.     Studies: Ct Abdomen Pelvis W Contrast  03/31/2013   *RADIOLOGY REPORT*  Clinical Data: Rectal bleeding, history of colon cancer  CT ABDOMEN AND PELVIS WITH CONTRAST  Technique:  Multidetector CT imaging of the abdomen and pelvis was performed following the standard protocol during bolus administration of intravenous contrast.  Contrast: OMNIPAQUE IOHEXOL 300 MG/ML  SOLN  Comparison: No similar prior study is available for comparison.  Findings: Trace right pleural effusion noted.  Coronary arterial stent or calcification noted.  Lung bases are otherwise clear with the exception of minimal subpleural presumed atelectasis.  Gallstones noted without other CT evidence for acute cholecystitis. Liver, right kidney, adrenal glands, spleen, and pancreas are normal.  Too small to characterize sub centimeter left renal cortical hypodensities are noted, statistically most likely cysts. Moderate atheromatous aortic calcification without aneurysm.  There is diffuse skin thickening and subjacent fat stranding involving the left buttock which is incompletely imaged.  There are areas of subcutaneous gas within this area of the, for example image 87.  No bowel wall thickening or focal segmental dilatation is identified.  Lumbar spine degenerative change is noted.  IMPRESSION: Diffuse skin thickening, subjacent fat stranding, and gas within the superficial soft tissue of the left buttock, most compatible with decubitus ulcer versus abscess in the absence of trauma.   Original Report Authenticated By: Christiana Pellant, M.D.   Dg Chest Port 1 View  03/30/2013   *RADIOLOGY REPORT*  Clinical Data: Cough and weakness  PORTABLE CHEST - 1 VIEW  Comparison: None.  Findings: The patient is  rotated slightly to the left.  Ill-defined left apical airspace opacity overlying the left upper lobe is noted.  Heart size is normal.  The right lung is grossly clear. Lung volumes are low with crowding of the bronchovascular markings. No pleural effusion.  No acute osseous abnormality.  Deformity of the right proximal humerus may indicate Hill-Sachs deformity.  The left proximal humerus is not well visualized but there is cortical irregularity which could indicate degenerative change or fracture but is not further evaluated.  IMPRESSION: Ill-defined left upper lobe airspace opacity which could be artifactual related to rotation and superimposed structures but further evaluation with dedicated PA and lateral chest radiograph is recommended for better visualization.  Cortical irregularity at the left proximal humerus, question degenerative change or age indeterminate fracture.  Correlate for point tenderness and consider dedicated imaging of this area.   Original Report Authenticated By: Christiana Pellant, M.D.    Scheduled Meds: . allopurinol  300 mg Oral Daily  . aspirin EC  81 mg Oral Daily  . atorvastatin  80 mg Oral Daily  . benztropine  1 mg Oral Daily  . clopidogrel  75 mg Oral Daily  . Ensure Plus  237 mL Oral BID BM  . ferrous sulfate  325 mg Oral BID  . gabapentin  600 mg Oral QHS  . insulin aspart  0-9 Units Subcutaneous TID WC  . magnesium oxide  400 mg Oral TID  . mirtazapine  15 mg Oral QHS  . multivitamin with minerals  1 tablet Oral Daily  . piperacillin-tazobactam (ZOSYN)  IV  3.375 g Intravenous Q8H  . polyethylene glycol  17 g Oral Daily  . potassium chloride SA  60 mEq Oral Daily  . QUEtiapine  200 mg Oral QHS  . risperiDONE  3 mg Oral BID  . sodium chloride  3 mL Intravenous Q12H  . vancomycin  1,000 mg Intravenous Q12H   Continuous Infusions: . sodium chloride 125 mL/hr at 03/31/13 2440    Principal Problem:   Cellulitis Active Problems:   CAD (coronary artery  disease)   Hidradenitis suppurativa   Diabetes mellitus   Schizophrenia   History of CVA (cerebrovascular accident)   Anemia   Hyperlipidemia   Gout    Time spent:    [facility]  [facility] Pager [phone]. If 7PM-7AM, please contact night-coverage at [email or web address], password [facility] 03/31/2013, 2:10 PM  LOS: 1 day

## 2013-04-01 LAB — GLUCOSE, CAPILLARY
Glucose-Capillary: 73 mg/dL (ref 70–99)
Glucose-Capillary: 138 mg/dL — ABNORMAL HIGH (ref 70–99)

## 2013-04-01 LAB — BASIC METABOLIC PANEL
CO2: 25 mEq/L (ref 19–32)
Chloride: 101 mEq/L (ref 96–112)
Glucose, Bld: 130 mg/dL — ABNORMAL HIGH (ref 70–99)
Potassium: 4.1 mEq/L (ref 3.5–5.1)
Sodium: 133 mEq/L — ABNORMAL LOW (ref 135–145)

## 2013-04-01 LAB — CBC
Hemoglobin: 7.8 g/dL — ABNORMAL LOW (ref 13.0–17.0)
MCH: 29.5 pg (ref 26.0–34.0)
Platelets: 468 10*3/uL — ABNORMAL HIGH (ref 150–400)
RBC: 2.64 MIL/uL — ABNORMAL LOW (ref 4.22–5.81)
WBC: 12.4 10*3/uL — ABNORMAL HIGH (ref 4.0–10.5)

## 2013-04-01 LAB — VANCOMYCIN, TROUGH: Vancomycin Tr: 15.9 ug/mL (ref 10.0–20.0)

## 2013-04-01 LAB — URINE CULTURE: Colony Count: 3000

## 2013-04-01 MED ORDER — DEXTROSE-NACL 5-0.45 % IV SOLN
INTRAVENOUS | Status: DC
  Filled 2013-04-01 (×2): qty 1000

## 2013-04-01 MED ORDER — VANCOMYCIN HCL IN DEXTROSE 750-5 MG/150ML-% IV SOLN
750.0000 mg | Freq: Two times a day (BID) | INTRAVENOUS | Status: DC
  Administered 2013-04-01 – 2013-04-03 (×4): 750 mg via INTRAVENOUS
  Filled 2013-04-01 (×5): qty 150

## 2013-04-01 MED ORDER — SODIUM CHLORIDE 0.9 % IV SOLN
INTRAVENOUS | Status: DC
  Administered 2013-04-01 – 2013-04-07 (×9): via INTRAVENOUS

## 2013-04-01 MED ORDER — GLUCOSE 40 % PO GEL: 1.0000 | ORAL | Status: DC | PRN

## 2013-04-01 MED ORDER — GLUCOSE 40 % PO GEL
ORAL | Status: AC
  Administered 2013-04-01: 37.5 g
  Filled 2013-04-01: qty 1

## 2013-04-01 NOTE — Progress Notes (Signed)
[facility] PROGRESS NOTE  Xavier Khan WJX:[id number] DOB: [birth year] DOA: 03/30/2013 PCP: No PCP Per Patient  Assessment/Plan: 1. Hidradenitis suppurativa, Acute on Chronic infection - CT pelvis noted -continue IV Vanc/Zosyn, Day 3 -CCS following, will need debridement -sister at bedside to d/w CCS today -resume diet, till surgery planned  2. Anemia - stable, check anemia panel.  3. History of CVA with left-sided hemiplegia - continue plavix  4. Diabetes mellitus type 2 - with hypoglycemic episodes -stopped oral hypoglycemics, SSI  5. History of colon cancer status post resection last year.  6. Dementia: stable, ongoing cognitive and functional decline for a while per sister  7. Schizophrenia - continue cogentin, seroquel and risperdal.  8.    Hyperlipidemia - continue statin  9.     Gout - continue allopurinol.  Code Status: FULL Family Communication: d/w sister at bedside Disposition Plan: to be determined   Consultants:  CCS  Antibiotics:  Vanc 5/23  Zosyn 5/23  HPI/Subjective: Denies any complaints, but does not talk/volunteeer information  Objective: Filed Vitals:   03/31/13 2050 04/01/13 0410 04/01/13 0658 04/01/13 1423  BP: 110/64 101/57  99/62  Pulse: 101 109  107  Temp: 100 F (37.8 C) 99 F (37.2 C)  99.1 F (37.3 C)  TempSrc: Oral Oral  Oral  Resp: 18 16  18   Height:      Weight:   57.8 kg (127 lb 6.8 oz)   SpO2: 97% 100%  100%    Intake/Output Summary (Last 24 hours) at 04/01/13 1450 Last data filed at 04/01/13 1230  Gross per 24 hour  Intake    720 ml  Output   1550 ml  Net   -830 ml   Filed Weights   03/30/13 2300 03/31/13 0634 04/01/13 0658  Weight: 56.4 kg (124 lb 5.4 oz) 55.3 kg (121 lb 14.6 oz) 57.8 kg (127 lb 6.8 oz)    Exam:   General:  Alert, awake,  does not converse  Cardiovascular: S1S2/RRR  Respiratory: CTAB, diminished at bases  Abdomen: soft, NT, BS present Perineum: Thick, induration of the left  buttock, bilateral perineum, proximal medial thighs with some purulence       Data Reviewed: Basic Metabolic Panel:  Recent Labs Lab 03/30/13 1606 03/31/13 0530 04/01/13 0340  NA 132* 134* 133*  K 3.7 3.6 4.1  CL 94* 101 101  CO2 26 23 25   GLUCOSE 61* 47* 130*  BUN 6 4* 7  CREATININE 0.58 0.54 0.60  CALCIUM 9.1 8.4 8.3*   Liver Function Tests:  Recent Labs Lab 03/30/13 1606 03/31/13 0530  AST 20 15  ALT 19 14  ALKPHOS 218* 172*  BILITOT 0.5 0.5  PROT 11.4* 8.9*  ALBUMIN 2.1* 1.6*   No results found for this basename: LIPASE, AMYLASE,  in the last 168 hours No results found for this basename: AMMONIA,  in the last 168 hours CBC:  Recent Labs Lab 03/30/13 1606 03/30/13 2214 03/31/13 0530 04/01/13 0340  WBC 11.1* 10.7* 10.8* 12.4*  HGB 9.9* 9.0* 8.9* 7.8*  HCT 29.5* 27.6* 27.7* 24.7*  MCV 92.2 91.7 92.0 93.6  PLT 524* 479* 476* 468*   Cardiac Enzymes: No results found for this basename: CKTOTAL, CKMB, CKMBINDEX, TROPONINI,  in the last 168 hours BNP (last 3 results) No results found for this basename: PROBNP,  in the last 8760 hours CBG:  Recent Labs Lab 03/31/13 1059 03/31/13 1619 03/31/13 2044 04/01/13 0627 04/01/13 1132  GLUCAP 52* 110* 159* 73 138*  Recent Results (from the past 240 hour(s))  CULTURE, BLOOD (ROUTINE X 2)     Status: None   Collection Time    03/30/13  6:00 PM      Result Value Range Status   Specimen Description BLOOD RIGHT WRIST   Final   Special Requests BOTTLES DRAWN AEROBIC ONLY 5CC   Final   Culture  Setup Time 03/31/2013 03:03   Final   Culture     Final   Value:        BLOOD CULTURE RECEIVED NO GROWTH TO DATE CULTURE WILL BE HELD FOR 5 DAYS BEFORE ISSUING A FINAL NEGATIVE REPORT   Report Status PENDING   Incomplete  CULTURE, BLOOD (ROUTINE X 2)     Status: None   Collection Time    03/30/13  7:08 PM      Result Value Range Status   Specimen Description BLOOD HAND RIGHT   Final   Special Requests BOTTLES  DRAWN AEROBIC ONLY 3CC   Final   Culture  Setup Time 03/31/2013 03:03   Final   Culture     Final   Value:        BLOOD CULTURE RECEIVED NO GROWTH TO DATE CULTURE WILL BE HELD FOR 5 DAYS BEFORE ISSUING A FINAL NEGATIVE REPORT   Report Status PENDING   Incomplete  URINE CULTURE     Status: None   Collection Time    03/30/13  8:59 PM      Result Value Range Status   Specimen Description URINE, CLEAN CATCH   Final   Special Requests NONE   Final   Culture  Setup Time 03/31/2013 01:50   Final   Colony Count 3,000 COLONIES/ML   Final   Culture INSIGNIFICANT GROWTH   Final   Report Status 04/01/2013 FINAL   Final  GRAM STAIN     Status: None   Collection Time    03/30/13  8:59 PM      Result Value Range Status   Specimen Description URINE, CLEAN CATCH   Final   Special Requests NONE   Final   Gram Stain     Final   Value: CYTOSPIN     Neg for bacteria     WBC PRESENT,BOTH PMN AND MONONUCLEAR Only 2 cells seen on slide     Results Called toRayburn Ma, RN [name]   Report Status 03/30/2013 FINAL   Final  MRSA PCR SCREENING     Status: None   Collection Time    03/31/13 12:17 AM      Result Value Range Status   MRSA by PCR NEGATIVE  NEGATIVE Final   Comment:            The GeneXpert MRSA Assay (FDA     approved for NASAL specimens     only), is one component of a     comprehensive MRSA colonization     surveillance program. It is not     intended to diagnose MRSA     infection nor to guide or     monitor treatment for     MRSA infections.     Studies: Ct Abdomen Pelvis W Contrast  03/31/2013   *RADIOLOGY REPORT*  Clinical Data: Rectal bleeding, history of colon cancer  CT ABDOMEN AND PELVIS WITH CONTRAST  Technique:  Multidetector CT imaging of the abdomen and pelvis was performed following the standard protocol during bolus administration of intravenous contrast.  Contrast: OMNIPAQUE IOHEXOL 300  MG/ML  SOLN  Comparison: No similar prior study is available for  comparison.  Findings: Trace right pleural effusion noted.  Coronary arterial stent or calcification noted.  Lung bases are otherwise clear with the exception of minimal subpleural presumed atelectasis.  Gallstones noted without other CT evidence for acute cholecystitis. Liver, right kidney, adrenal glands, spleen, and pancreas are normal.  Too small to characterize sub centimeter left renal cortical hypodensities are noted, statistically most likely cysts. Moderate atheromatous aortic calcification without aneurysm.  There is diffuse skin thickening and subjacent fat stranding involving the left buttock which is incompletely imaged.  There are areas of subcutaneous gas within this area of the, for example image 87.  No bowel wall thickening or focal segmental dilatation is identified.  Lumbar spine degenerative change is noted.  IMPRESSION: Diffuse skin thickening, subjacent fat stranding, and gas within the superficial soft tissue of the left buttock, most compatible with decubitus ulcer versus abscess in the absence of trauma.   Original Report Authenticated By: Christiana Pellant, M.D.   Dg Chest Port 1 View  03/30/2013   *RADIOLOGY REPORT*  Clinical Data: Cough and weakness  PORTABLE CHEST - 1 VIEW  Comparison: None.  Findings: The patient is rotated slightly to the left.  Ill-defined left apical airspace opacity overlying the left upper lobe is noted.  Heart size is normal.  The right lung is grossly clear. Lung volumes are low with crowding of the bronchovascular markings. No pleural effusion.  No acute osseous abnormality.  Deformity of the right proximal humerus may indicate Hill-Sachs deformity.  The left proximal humerus is not well visualized but there is cortical irregularity which could indicate degenerative change or fracture but is not further evaluated.  IMPRESSION: Ill-defined left upper lobe airspace opacity which could be artifactual related to rotation and superimposed structures but further  evaluation with dedicated PA and lateral chest radiograph is recommended for better visualization.  Cortical irregularity at the left proximal humerus, question degenerative change or age indeterminate fracture.  Correlate for point tenderness and consider dedicated imaging of this area.   Original Report Authenticated By: Christiana Pellant, M.D.    Scheduled Meds: . allopurinol  300 mg Oral Daily  . aspirin EC  81 mg Oral Daily  . atorvastatin  80 mg Oral Daily  . benztropine  1 mg Oral Daily  . clopidogrel  75 mg Oral Daily  . Ensure Plus  237 mL Oral BID BM  . ferrous sulfate  325 mg Oral BID  . gabapentin  600 mg Oral QHS  . insulin aspart  0-9 Units Subcutaneous TID WC  . magnesium oxide  400 mg Oral TID  . mirtazapine  15 mg Oral QHS  . multivitamin with minerals  1 tablet Oral Daily  . piperacillin-tazobactam (ZOSYN)  IV  3.375 g Intravenous Q8H  . polyethylene glycol  17 g Oral Daily  . potassium chloride SA  60 mEq Oral Daily  . QUEtiapine  200 mg Oral QHS  . risperiDONE  3 mg Oral BID  . sodium chloride  3 mL Intravenous Q12H  . vancomycin  1,000 mg Intravenous Q12H   Continuous Infusions: . sodium chloride 75 mL/hr at 04/01/13 1049    Principal Problem:   Cellulitis Active Problems:   CAD (coronary artery disease)   Hidradenitis suppurativa   Diabetes mellitus   Schizophrenia   History of CVA (cerebrovascular accident)   Anemia   Hyperlipidemia   Gout   Dementia    Time spent:     [facility]  [facility] Pager [phone]. If 7PM-7AM, please contact night-coverage at [email or web address], password [facility] 04/01/2013, 2:50 PM  LOS: 2 days

## 2013-04-01 NOTE — Progress Notes (Signed)
Pharmacy: Vancomycin/Zosyn  57yom on day #3 vancomycin/zosyn for hidradenitis suppurative. Vancomycin trough = 15.9 which is slightly above goal of 10-15. Renal function is stable. Zosyn dose remains appropriate. Pending patient's decision to go to OR for I&D.  5/23 Vancomycin>> 5/23 Zosyn>> 5/23 blood>>ngtd 5/23 urine>>negative final  Plan: 1) Decrease vancomycin to 750mg  IV q12 2) Continue zosyn 3.375g IV q8 3) Continue to follow renal function, plans for OR, LOT  Louie Casa, PharmD, BCPS 04/01/13, 8:50 PM

## 2013-04-01 NOTE — Progress Notes (Signed)
Subjective: Pt more aware this AM.  Discussed the situation with him in regards to his condition.  No complaints of pain this AM.  Objective: Vital signs in last 24 hours: Temp:  [99 F (37.2 C)-100 F (37.8 C)] 99 F (37.2 C) (05/25 0410) Pulse Rate:  [101-109] 109 (05/25 0410) Resp:  [16-18] 16 (05/25 0410) BP: (98-110)/(57-64) 101/57 mmHg (05/25 0410) SpO2:  [97 %-100 %] 100 % (05/25 0410) Weight:  [127 lb 6.8 oz (57.8 kg)] 127 lb 6.8 oz (57.8 kg) (05/25 0658) Last BM Date: 03/30/13  Intake/Output from previous day: 05/24 0701 - 05/25 0700 In: 240 [P.O.:240] Out: 1500 [Urine:1500] Intake/Output this shift:    General appearance: alert and cooperative Incision/Wound: L groin wound with some purulence expressed from one draining area, min erythema  Lab Results:   Recent Labs  03/31/13 0530 04/01/13 0340  WBC 10.8* 12.4*  HGB 8.9* 7.8*  HCT 27.7* 24.7*  PLT 476* 468*   BMET  Recent Labs  03/31/13 0530 04/01/13 0340  NA 134* 133*  K 3.6 4.1  CL 101 101  CO2 23 25  GLUCOSE 47* 130*  BUN 4* 7  CREATININE 0.54 0.60  CALCIUM 8.4 8.3*   PT/INR No results found for this basename: LABPROT, INR,  in the last 72 hours ABG No results found for this basename: PHART, PCO2, PO2, HCO3,  in the last 72 hours  Studies/Results: Ct Abdomen Pelvis W Contrast  03/31/2013   *RADIOLOGY REPORT*  Clinical Data: Rectal bleeding, history of colon cancer  CT ABDOMEN AND PELVIS WITH CONTRAST  Technique:  Multidetector CT imaging of the abdomen and pelvis was performed following the standard protocol during bolus administration of intravenous contrast.  Contrast: OMNIPAQUE IOHEXOL 300 MG/ML  SOLN  Comparison: No similar prior study is available for comparison.  Findings: Trace right pleural effusion noted.  Coronary arterial stent or calcification noted.  Lung bases are otherwise clear with the exception of minimal subpleural presumed atelectasis.  Gallstones noted without  other CT evidence for acute cholecystitis. Liver, right kidney, adrenal glands, spleen, and pancreas are normal.  Too small to characterize sub centimeter left renal cortical hypodensities are noted, statistically most likely cysts. Moderate atheromatous aortic calcification without aneurysm.  There is diffuse skin thickening and subjacent fat stranding involving the left buttock which is incompletely imaged.  There are areas of subcutaneous gas within this area of the, for example image 87.  No bowel wall thickening or focal segmental dilatation is identified.  Lumbar spine degenerative change is noted.  IMPRESSION: Diffuse skin thickening, subjacent fat stranding, and gas within the superficial soft tissue of the left buttock, most compatible with decubitus ulcer versus abscess in the absence of trauma.   Original Report Authenticated By: Christiana Pellant, M.D.   Dg Chest Port 1 View  03/30/2013   *RADIOLOGY REPORT*  Clinical Data: Cough and weakness  PORTABLE CHEST - 1 VIEW  Comparison: None.  Findings: The patient is rotated slightly to the left.  Ill-defined left apical airspace opacity overlying the left upper lobe is noted.  Heart size is normal.  The right lung is grossly clear. Lung volumes are low with crowding of the bronchovascular markings. No pleural effusion.  No acute osseous abnormality.  Deformity of the right proximal humerus may indicate Hill-Sachs deformity.  The left proximal humerus is not well visualized but there is cortical irregularity which could indicate degenerative change or fracture but is not further evaluated.  IMPRESSION: Ill-defined left upper lobe  airspace opacity which could be artifactual related to rotation and superimposed structures but further evaluation with dedicated PA and lateral chest radiograph is recommended for better visualization.  Cortical irregularity at the left proximal humerus, question degenerative change or age indeterminate fracture.  Correlate for point  tenderness and consider dedicated imaging of this area.   Original Report Authenticated By: Christiana Pellant, M.D.    Anti-infectives: Anti-infectives   Start     Dose/Rate Route Frequency Ordered Stop   03/31/13 0800  vancomycin (VANCOCIN) IVPB 1000 mg/200 mL premix     1,000 mg 200 mL/hr over 60 Minutes Intravenous Every 12 hours 03/30/13 1835     03/30/13 2200  piperacillin-tazobactam (ZOSYN) IVPB 3.375 g     3.375 g 12.5 mL/hr over 240 Minutes Intravenous 3 times per day 03/30/13 1835     03/30/13 1730  piperacillin-tazobactam (ZOSYN) IVPB 3.375 g     3.375 g 100 mL/hr over 30 Minutes Intravenous  Once 03/30/13 1724 03/30/13 2106   03/30/13 1730  vancomycin (VANCOCIN) IVPB 1000 mg/200 mL premix     1,000 mg 200 mL/hr over 60 Minutes Intravenous  Once 03/30/13 1724 03/30/13 2206      Assessment/Plan: Hidradenitis  I d/w pt his condition, with his sister and brother in law present, and the possible need to go to the OR for adequate surgical drainage.  The pt refused OR.  I discussed with the family that this could get worse, and spread to other parts of his body, he voiced understanding and continue to deny any surgery.   -Con't abx for now -Family with discuss further with the pt. -Will follow.   LOS: 2 days    Marigene Ehlers., [facility] 04/01/2013

## 2013-04-01 NOTE — ED Provider Notes (Signed)
  I performed a history and physical examination of Xavier Khan and discussed his management with Dr. Berline Chough.  I agree with the history, physical, assessment, and plan of care, with the following exceptions: None  I was present for the following procedures: None Time Spent in Critical Care of the patient: None Time spent in discussions with the patient and family: 42   Xavier Khan is a 57 y.o. Male who presents today with his sister with whom he lives and who is his guardian.  She states he has been increasingly weak, rarely getting out of bed, Poor po intake and having chills today.  He has hidradenitis suppuritiva and has had worsened redness and discharge from the perineal area.  She has noted bleeding mixed in but is unable to tell where it is coming from.   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  CV- tachycardiac Abdomen soft, nontender Skin- diffuse erythema and multiple areas of discharge on perineum.     I performed a history and physical examination of Xavier Khan and discussed his management with Dr. Berline Chough.  I agree with the history, physical, assessment, and plan of care, with the following exceptions: None  I was present for the following procedures: None Time Spent in Critical Care of the patient: None Time spent in discussions with the patient and family: 80  Yannely Kintzel S    Maurisha Mongeau S    Hilario Quarry, MD 04/01/13 1124

## 2013-04-01 NOTE — Progress Notes (Signed)
Hypoglycemic Event  CBG: 51  Treatment: dextrose 40% oral gel 37.5 g  Symptoms: none  Follow-up CBG: Time: 2150 CBG Result: 127  Possible Reasons for Event: unknown  Comments/MD notified: Pt also given Ensure drink. Will continue to monitor.    Jeannine Kitten  Remember to initiate Hypoglycemia Order Set & complete

## 2013-04-02 DIAGNOSIS — D649 Anemia, unspecified: Secondary | ICD-10-CM

## 2013-04-02 DIAGNOSIS — F0390 Unspecified dementia without behavioral disturbance: Secondary | ICD-10-CM

## 2013-04-02 DIAGNOSIS — A419 Sepsis, unspecified organism: Secondary | ICD-10-CM

## 2013-04-02 LAB — GLUCOSE, CAPILLARY: Glucose-Capillary: 196 mg/dL — ABNORMAL HIGH (ref 70–99)

## 2013-04-02 LAB — HEMOGLOBIN A1C: Mean Plasma Glucose: 91 mg/dL (ref <117)

## 2013-04-02 NOTE — Progress Notes (Signed)
04/02/2013 1300 Pt. Refusing dressing changes at this time for wounds, pt. Also refusing to be repositioned. Pt. Educated on importance of pressure ulcer prevention. Will continue to closely monitor patient.  Clarrisa Kaylor, Blanchard Kelch

## 2013-04-02 NOTE — Progress Notes (Signed)
[facility] PROGRESS NOTE  Xavier Khan VHQ:[id number] DOB: [birth year] DOA: 03/30/2013 PCP: No PCP Per Patient  Assessment/Plan: 1. Hidradenitis suppurativa, Acute on Chronic infection - CT pelvis noted -continue IV Vanc/Zosyn, Day 4 -CCS following, will need debridement but pt refused this although sister is PoA and understands that this may be necessary -wound care consult  2. Anemia - with blood loss from 1 and hemodilution,  -no GI bleeding noted, Anemia panel -tarns fuse  If <7.  3. History of CVA with left-sided hemiplegia - continue plavix  4. Diabetes mellitus type 2 - with hypoglycemic episodes -stopped oral hypoglycemics, SSI  5. History of colon cancer status post resection last year.  6. Dementia: stable, ongoing cognitive and functional decline for a while per sister  7. Schizophrenia - continue cogentin, seroquel and risperdal.  8.    Hyperlipidemia - continue statin  9.     Gout - continue allopurinol.  Code Status: FULL Family Communication: d/w sister at bedside Disposition Plan: to be determined   Consultants:  CCS  Antibiotics:  Vanc 5/23  Zosyn 5/23  HPI/Subjective: Denies any complaints, "i dont want any operation"  Objective: Filed Vitals:   04/01/13 1423 04/01/13 2058 04/02/13 0500 04/02/13 0551  BP: 99/62 100/53  100/60  Pulse: 107 107  99  Temp: 99.1 F (37.3 C) 99.6 F (37.6 C)  98.9 F (37.2 C)  TempSrc: Oral Oral  Oral  Resp: 18 18  18   Height:      Weight:   58.6 kg (129 lb 3 oz)   SpO2: 100% 100%  100%    Intake/Output Summary (Last 24 hours) at 04/02/13 1358 Last data filed at 04/02/13 1230  Gross per 24 hour  Intake    720 ml  Output   1700 ml  Net   -980 ml   Filed Weights   03/31/13 0634 04/01/13 0658 04/02/13 0500  Weight: 55.3 kg (121 lb 14.6 oz) 57.8 kg (127 lb 6.8 oz) 58.6 kg (129 lb 3 oz)    Exam:   General:  Alert, awake,  does not converse  Cardiovascular: S1S2/RRR  Respiratory: CTAB,  diminished at bases  Abdomen: soft, NT, BS present Perineum: Thick, induration of the left buttock, bilateral perineum, proximal medial thighs with some purulence       Data Reviewed: Basic Metabolic Panel:  Recent Labs Lab 03/30/13 1606 03/31/13 0530 04/01/13 0340  NA 132* 134* 133*  K 3.7 3.6 4.1  CL 94* 101 101  CO2 26 23 25   GLUCOSE 61* 47* 130*  BUN 6 4* 7  CREATININE 0.58 0.54 0.60  CALCIUM 9.1 8.4 8.3*   Liver Function Tests:  Recent Labs Lab 03/30/13 1606 03/31/13 0530  AST 20 15  ALT 19 14  ALKPHOS 218* 172*  BILITOT 0.5 0.5  PROT 11.4* 8.9*  ALBUMIN 2.1* 1.6*   No results found for this basename: LIPASE, AMYLASE,  in the last 168 hours No results found for this basename: AMMONIA,  in the last 168 hours CBC:  Recent Labs Lab 03/30/13 1606 03/30/13 2214 03/31/13 0530 04/01/13 0340  WBC 11.1* 10.7* 10.8* 12.4*  HGB 9.9* 9.0* 8.9* 7.8*  HCT 29.5* 27.6* 27.7* 24.7*  MCV 92.2 91.7 92.0 93.6  PLT 524* 479* 476* 468*   Cardiac Enzymes: No results found for this basename: CKTOTAL, CKMB, CKMBINDEX, TROPONINI,  in the last 168 hours BNP (last 3 results) No results found for this basename: PROBNP,  in the last 8760 hours CBG:  Recent Labs Lab 04/01/13 1640 04/01/13 2056 04/01/13 2153 04/02/13 0553 04/02/13 1143  GLUCAP 86 51* 127* 74 238*    Recent Results (from the past 240 hour(s))  CULTURE, BLOOD (ROUTINE X 2)     Status: None   Collection Time    03/30/13  6:00 PM      Result Value Range Status   Specimen Description BLOOD RIGHT WRIST   Final   Special Requests BOTTLES DRAWN AEROBIC ONLY 5CC   Final   Culture  Setup Time 03/31/2013 03:03   Final   Culture     Final   Value:        BLOOD CULTURE RECEIVED NO GROWTH TO DATE CULTURE WILL BE HELD FOR 5 DAYS BEFORE ISSUING A FINAL NEGATIVE REPORT   Report Status PENDING   Incomplete  CULTURE, BLOOD (ROUTINE X 2)     Status: None   Collection Time    03/30/13  7:08 PM      Result Value  Range Status   Specimen Description BLOOD HAND RIGHT   Final   Special Requests BOTTLES DRAWN AEROBIC ONLY 3CC   Final   Culture  Setup Time 03/31/2013 03:03   Final   Culture     Final   Value:        BLOOD CULTURE RECEIVED NO GROWTH TO DATE CULTURE WILL BE HELD FOR 5 DAYS BEFORE ISSUING A FINAL NEGATIVE REPORT   Report Status PENDING   Incomplete  URINE CULTURE     Status: None   Collection Time    03/30/13  8:59 PM      Result Value Range Status   Specimen Description URINE, CLEAN CATCH   Final   Special Requests NONE   Final   Culture  Setup Time 03/31/2013 01:50   Final   Colony Count 3,000 COLONIES/ML   Final   Culture INSIGNIFICANT GROWTH   Final   Report Status 04/01/2013 FINAL   Final  GRAM STAIN     Status: None   Collection Time    03/30/13  8:59 PM      Result Value Range Status   Specimen Description URINE, CLEAN CATCH   Final   Special Requests NONE   Final   Gram Stain     Final   Value: CYTOSPIN     Neg for bacteria     WBC PRESENT,BOTH PMN AND MONONUCLEAR Only 2 cells seen on slide     Results Called toRayburn Ma, RN [name]   Report Status 03/30/2013 FINAL   Final  MRSA PCR SCREENING     Status: None   Collection Time    03/31/13 12:17 AM      Result Value Range Status   MRSA by PCR NEGATIVE  NEGATIVE Final   Comment:            The GeneXpert MRSA Assay (FDA     approved for NASAL specimens     only), is one component of a     comprehensive MRSA colonization     surveillance program. It is not     intended to diagnose MRSA     infection nor to guide or     monitor treatment for     MRSA infections.     Studies: No results found.  Scheduled Meds: . allopurinol  300 mg Oral Daily  . aspirin EC  81 mg Oral Daily  . atorvastatin  80 mg Oral Daily  . benztropine  1 mg Oral Daily  . clopidogrel  75 mg Oral Daily  . Ensure Plus  237 mL Oral BID BM  . ferrous sulfate  325 mg Oral BID  . gabapentin  600 mg Oral QHS  . insulin aspart  0-9  Units Subcutaneous TID WC  . magnesium oxide  400 mg Oral TID  . mirtazapine  15 mg Oral QHS  . multivitamin with minerals  1 tablet Oral Daily  . piperacillin-tazobactam (ZOSYN)  IV  3.375 g Intravenous Q8H  . polyethylene glycol  17 g Oral Daily  . potassium chloride SA  60 mEq Oral Daily  . QUEtiapine  200 mg Oral QHS  . risperiDONE  3 mg Oral BID  . sodium chloride  3 mL Intravenous Q12H  . vancomycin  750 mg Intravenous Q12H   Continuous Infusions: . sodium chloride 50 mL/hr at 04/02/13 1610    Principal Problem:   Cellulitis Active Problems:   CAD (coronary artery disease)   Hidradenitis suppurativa   Diabetes mellitus   Schizophrenia   History of CVA (cerebrovascular accident)   Anemia   Hyperlipidemia   Gout   Dementia    Time spent:    [facility]  [facility] Pager [phone]. If 7PM-7AM, please contact night-coverage at [email or web address], password [facility] 04/02/2013, 1:58 PM  LOS: 3 days

## 2013-04-03 ENCOUNTER — Telehealth (INDEPENDENT_AMBULATORY_CARE_PROVIDER_SITE_OTHER): Payer: Self-pay | Admitting: General Surgery

## 2013-04-03 DIAGNOSIS — L732 Hidradenitis suppurativa: Secondary | ICD-10-CM

## 2013-04-03 DIAGNOSIS — F0390 Unspecified dementia without behavioral disturbance: Secondary | ICD-10-CM

## 2013-04-03 DIAGNOSIS — F209 Schizophrenia, unspecified: Secondary | ICD-10-CM

## 2013-04-03 LAB — COMPREHENSIVE METABOLIC PANEL
ALT: 14 U/L (ref 0–53)
Alkaline Phosphatase: 191 U/L — ABNORMAL HIGH (ref 39–117)
CO2: 24 mEq/L (ref 19–32)
GFR calc Af Amer: >90 mL/min (ref >90)
GFR calc non Af Amer: >90 mL/min (ref >90)
Glucose, Bld: 211 mg/dL — ABNORMAL HIGH (ref 70–99)
Potassium: 4.7 mEq/L (ref 3.5–5.1)
Sodium: 129 mEq/L — ABNORMAL LOW (ref 135–145)
Total Protein: 9.8 g/dL — ABNORMAL HIGH (ref 6.0–8.3)

## 2013-04-03 LAB — CBC
HCT: 26.5 % — ABNORMAL LOW (ref 39.0–52.0)
MCH: 30.4 pg (ref 26.0–34.0)
MCHC: 32.8 g/dL (ref 30.0–36.0)
RDW: 15 % (ref 11.5–15.5)

## 2013-04-03 LAB — RETICULOCYTES
RBC.: 2.86 MIL/uL — ABNORMAL LOW (ref 4.22–5.81)
Retic Count, Absolute: 88.7 10*3/uL (ref 19.0–186.0)

## 2013-04-03 LAB — GLUCOSE, CAPILLARY
Glucose-Capillary: 181 mg/dL — ABNORMAL HIGH (ref 70–99)
Glucose-Capillary: 218 mg/dL — ABNORMAL HIGH (ref 70–99)

## 2013-04-03 MED ORDER — AMOXICILLIN-POT CLAVULANATE 500-125 MG PO TABS
1.0000 | ORAL_TABLET | Freq: Two times a day (BID) | ORAL | Status: DC
  Filled 2013-04-03: qty 1

## 2013-04-03 MED ORDER — AMOXICILLIN-POT CLAVULANATE 500-125 MG PO TABS
1.0000 | ORAL_TABLET | Freq: Two times a day (BID) | ORAL | Status: DC
  Administered 2013-04-04 – 2013-04-10 (×12): 500 mg via ORAL
  Filled 2013-04-03 (×15): qty 1

## 2013-04-03 MED ORDER — AMOXICILLIN-POT CLAVULANATE 500-125 MG PO TABS
1.0000 | ORAL_TABLET | Freq: Two times a day (BID) | ORAL | Status: DC
  Filled 2013-04-03 (×2): qty 1

## 2013-04-03 MED ORDER — DOXYCYCLINE HYCLATE 100 MG PO TABS
100.0000 mg | ORAL_TABLET | Freq: Two times a day (BID) | ORAL | Status: DC
  Administered 2013-04-03 – 2013-04-10 (×14): 100 mg via ORAL
  Filled 2013-04-03 (×15): qty 1

## 2013-04-03 NOTE — Consult Note (Signed)
WOC consult Note Reason for Consult: Hydradenitis and draining infection of perineum, buttock, and groin Wound type: Multiple full thickness areas  Pressure Ulcer POA: Not a pressure ulcer Wound bed: Multiple red, draining pustule sites Drainage (amount, consistency, odor): Large amount of brownish drainage from multiple oozing open sites Periwound: Red, tender Dressing procedure/placement/frequency: ABD pads to draining areas and placed mesh underwear to hold dressings in place, as tape is painful to remove. Topical treatment is not effective for this medical problem, treatment is aimed at assisting the patient in being comfortable and ease of wound care.  Patient has declined any surgical intervention after that team performed consult earlier.Norva Karvonen RN, MSN Student  Please re-consult if further assistance is needed.  Thank-you,  Cammie Mcgee MSN, RN, CWOCN, [place], CNS [phone]

## 2013-04-03 NOTE — Evaluation (Signed)
Physical Therapy Evaluation Patient Details Name: Xavier Khan MRN: [id number] DOB: [birth year] Today's Date: 04/03/2013 Time: 4098-1191 PT Time Calculation (min): 33 min  PT Assessment / Plan / Recommendation Clinical Impression  Patient is a 57 y/o male admitted with hidradenitis suppuritiva, CAD status post stenting, colon cancer status post surgery last year, CVA with left-sided hemiplegia, schizophrenia, cognitive deficits, chronic anemia was found to have increasing bleeding from his groin area where patient has hidranitis suppuritiva.  Presents to PT with severe dependencies in mobility due to decreased AROM with increased muscle tone, decreased balance, decreased strength, and decreased tolerance to activity with c/o pain with PROM.  Feel best disposition would be STSNF, however did try to convince him (with family present,) that due to mobility deficits may not improve unless has infection properly treated including surgery.  Feel psychiatry consult may assist in determing pt's level of capacity.  Will follow acutely.    PT Assessment  Patient needs continued PT services    Follow Up Recommendations  SNF;Supervision/Assistance - 24 hour          Equipment Recommendations  Other (comment) (TBA, if d/c home would need wheelchair)    Recommendations for Other Services Other (comment) (Psychiatry consult)   Frequency Min 3X/week    Precautions / Restrictions Precautions Precautions: Fall Precaution Comments: contact isolation   Pertinent Vitals/Pain C/o pain with movement of LE's and trunk      Mobility  Bed Mobility Bed Mobility: Rolling Left;Left Sidelying to Sit Rolling Left: 2: Max assist Left Sidelying to Sit: 1: +2 Total assist Left Sidelying to Sit: Patient Percentage: 30% Details for Bed Mobility Assistance: assist to bring legs off bed and lift trunk Transfers Transfers: Sit to Stand;Stand to Sit Sit to Stand: 1: +2 Total assist;From bed Sit to Stand: Patient  Percentage: 50% Stand to Sit: 3: Mod assist;2: Max assist;To bed Details for Transfer Assistance: assist for anterior weight shift and to sit to flex at hips and knees Ambulation/Gait Ambulation/Gait Assistance: 2: Max assist Ambulation Distance (Feet): 15 Feet Assistive device: 1 person hand held assist Ambulation/Gait Assistance Details: ambulated around bed with pt's right arm around PT's waist and facilitation for anterior and lateral weight shift Gait Pattern: Wide base of support;Trunk flexed;Decreased hip/knee flexion - right;Decreased hip/knee flexion - left;Shuffle;Decreased stride length;Step-through pattern;Decreased trunk rotation    Exercises General Exercises - Lower Extremity Heel Slides: PROM;Both;5 reps;Supine Other Exercises Other Exercises: seated trunk rotation and weight shifting for decreased tone throughout trunk and extremities   PT Diagnosis: Difficulty walking;Generalized weakness;Acute pain  PT Problem List: Decreased strength;Decreased range of motion;Decreased cognition;Decreased activity tolerance;Decreased safety awareness;Decreased balance;Decreased mobility;Decreased coordination;Pain PT Treatment Interventions: DME instruction;Gait training;Functional mobility training;Patient/family education;Balance training;Therapeutic activities;Therapeutic exercise   PT Goals Acute Rehab PT Goals PT Goal Formulation: With patient/family Time For Goal Achievement: 04/17/13 Potential to Achieve Goals: Fair Pt will Roll Supine to Left Side: with min assist PT Goal: Rolling Supine to Left Side - Progress: Goal set today Pt will go Supine/Side to Sit: with min assist PT Goal: Supine/Side to Sit - Progress: Goal set today Pt will go Sit to Supine/Side: with mod assist PT Goal: Sit to Supine/Side - Progress: Goal set today Pt will go Sit to Stand: with min assist PT Goal: Sit to Stand - Progress: Goal set today Pt will Transfer Bed to Chair/Chair to Bed: with min  assist PT Transfer Goal: Bed to Chair/Chair to Bed - Progress: Goal set today Pt will Ambulate: 16 - 50 feet;with least  restrictive assistive device;with min assist PT Goal: Ambulate - Progress: Goal set today  Visit Information  Last PT Received On: 04/03/13 Assistance Needed: +2    Subjective Data  Subjective: I don't know Patient Stated Goal: To go home   Prior Functioning  Home Living Lives With: Family Available Help at Discharge: Family;Available PRN/intermittently Type of Home: House Home Access: Stairs to enter [employer] of Steps: 5 Entrance Stairs-Rails: Right;Left;Can reach both Home Layout: Two level;Able to live on main level with bedroom/bathroom Bathroom Shower/Tub: Engineer, manufacturing systems: Standard Home Adaptive Equipment: Quad cane Prior Function Level of Independence: Independent with assistive device(s) Able to Take Stairs?: Yes Driving: No Communication Communication: No difficulties Dominant Hand: Right    Cognition  Cognition Arousal/Alertness: Awake/alert Behavior During Therapy: Flat affect Overall Cognitive Status: Impaired/Different from baseline Area of Impairment: Orientation;Safety/judgement;Problem solving Orientation Level: Disoriented to;Situation Safety/Judgement: Decreased awareness of deficits Problem Solving: Slow processing;Decreased initiation;Requires tactile cues;Requires verbal cues;Difficulty sequencing General Comments: did not know day, knew month and year, did not know why in hosptial.  Did not respond to questions of ability to go home despite mobility difficulties or needing surgery to clear infection    Extremity/Trunk Assessment Right Upper Extremity Assessment RUE ROM/Strength/Tone: Deficits RUE ROM/Strength/Tone Deficits: AAROM WFL, limited active movement with stiffness throughout joints, strength at least 3/5. Left Upper Extremity Assessment LUE ROM/Strength/Tone: Deficits;Due to pain;Unable to  fully assess LUE ROM/Strength/Tone Deficits: h/o CVA left hemiparesis with hand contractures, and c/o pain when trying to extend at elbow Right Lower Extremity Assessment RLE ROM/Strength/Tone: Deficits;Due to pain;Unable to fully assess RLE ROM/Strength/Tone Deficits: stiff throughout LE's especially at knees with limited flexion passively in supine, improves in sitting Left Lower Extremity Assessment LLE ROM/Strength/Tone: Deficits;Due to pain;Unable to fully assess LLE ROM/Strength/Tone Deficits: stiff throughout LE's especially at knees with limited flexion passively in supine, improves in sitting Trunk Assessment Trunk Assessment: Other exceptions Trunk Exceptions: severely rigid in trunk and neck with even PROM difficult   Balance    End of Session PT - End of Session Equipment Utilized During Treatment: Gait belt Activity Tolerance: Patient limited by pain Patient left: in bed;with call bell/phone within reach;with bed alarm set;with family/visitor present;with nursing in room  GP     [facility] 04/03/2013, 5:18 PM

## 2013-04-03 NOTE — Progress Notes (Addendum)
[facility] PROGRESS NOTE  Xavier Khan ZOX:[id number] DOB: 02-21-1956 DOA: 03/30/2013 PCP: No PCP Per Patient Brief Narrative: Xavier Khan is a 57 y.o. male with known history of hidradenitis suppuritiva, CAD status post stenting, colon cancer status post surgery last year, CVA with left-sided hemiplegia, schizophrenia, cognitive deficits, chronic anemia was found to have increasing bleeding from his groin area where patient has hidranitis suppuritiva. As per patient's sister with whom patient lives patient was being treated with multiple does of antibiotics over the last one month it to increased drainage from the groin area. Patient also has been having slow blood loss from the area. Today patient was noticed to have more than usual blood loss and patient was felt to be weak. On arrival in the ER patient was found to be tachycardic and tachypneic and it improved with fluids. On exam patient had increase drainage from the groin area with blood. On-call surgeon was consulted and at this time patient has been admitted for worsening of is hidradenitis suppurativa   Assessment/Plan: 1. Hidradenitis suppurativa, Acute on Chronic infection - CT pelvis noted -S/p 5 days of IV Vanc/Zosyn, change to PO Augmentin and Doxycycline -CCS following, will need debridement but pt refused this although sister is PoA and wants him to have surgery she finally understands that pt still is able to decline surgery -wound care consult noted -Pts family is requesting another 24 hours to convince the pt about the need for surgery and if he still declines then he will be discharged home or to SNF  2. Anemia - with blood loss from 1 and hemodilution,  -no GI bleeding noted, Anemia panel pending -Pt has not allowed any lab draws for 2 days  -family trying to convince him to allow lab draws  3. History of CVA with left-sided hemiplegia - continue plavix  4. Diabetes mellitus type 2 - with hypoglycemic  episodes -stopped oral hypoglycemics, SSI  5. History of colon cancer status post resection last year.  6. Schizophrenia - continue cogentin, seroquel and risperdal.  8.    Hyperlipidemia - continue statin  9.     Gout - continue allopurinol.  Code Status: FULL Family Communication: d/w none at bedside and Meet with family 5/27 at 4pm at bedside Disposition Plan: Home Vs SNF tomorrow if he declines surgery after 24H   Consultants:  CCS  Antibiotics:  Vanc 5/23  Zosyn 5/23  HPI/Subjective: Denies any complaints, still adamantly refuses surgery  Objective: Filed Vitals:   04/02/13 2109 04/03/13 0526 04/03/13 0540 04/03/13 1100  BP: 113/64  96/63 102/53  Pulse: 101  98 93  Temp:   97.6 F (36.4 C)   TempSrc: Oral  Oral   Resp: 18  16   Height:      Weight:  57.7 kg (127 lb 3.3 oz)    SpO2: 100%  100%     Intake/Output Summary (Last 24 hours) at 04/03/13 1551 Last data filed at 04/03/13 1500  Gross per 24 hour  Intake    600 ml  Output   2200 ml  Net  -1600 ml   Filed Weights   04/01/13 0658 04/02/13 0500 04/03/13 0526  Weight: 57.8 kg (127 lb 6.8 oz) 58.6 kg (129 lb 3 oz) 57.7 kg (127 lb 3.3 oz)    Exam:   General:  Alert, awake,  Oriented to self and place  Cardiovascular: S1S2/RRR  Respiratory: CTAB, diminished at bases  Abdomen: soft, NT, BS present Perineum: Thick, dark  induration of  the left buttock, bilateral perineum, proximal medial thighs with small openings  Data Reviewed: Basic Metabolic Panel:  Recent Labs Lab 03/30/13 1606 03/31/13 0530 04/01/13 0340  NA 132* 134* 133*  K 3.7 3.6 4.1  CL 94* 101 101  CO2 26 23 25   GLUCOSE 61* 47* 130*  BUN 6 4* 7  CREATININE 0.58 0.54 0.60  CALCIUM 9.1 8.4 8.3*   Liver Function Tests:  Recent Labs Lab 03/30/13 1606 03/31/13 0530  AST 20 15  ALT 19 14  ALKPHOS 218* 172*  BILITOT 0.5 0.5  PROT 11.4* 8.9*  ALBUMIN 2.1* 1.6*   No results found for this basename: LIPASE, AMYLASE,   in the last 168 hours No results found for this basename: AMMONIA,  in the last 168 hours CBC:  Recent Labs Lab 03/30/13 1606 03/30/13 2214 03/31/13 0530 04/01/13 0340  WBC 11.1* 10.7* 10.8* 12.4*  HGB 9.9* 9.0* 8.9* 7.8*  HCT 29.5* 27.6* 27.7* 24.7*  MCV 92.2 91.7 92.0 93.6  PLT 524* 479* 476* 468*   Cardiac Enzymes: No results found for this basename: CKTOTAL, CKMB, CKMBINDEX, TROPONINI,  in the last 168 hours BNP (last 3 results) No results found for this basename: PROBNP,  in the last 8760 hours CBG:  Recent Labs Lab 04/02/13 1143 04/02/13 1626 04/02/13 2107 04/03/13 0546 04/03/13 1148  GLUCAP 238* 103* 196* 202* 240*    Recent Results (from the past 240 hour(s))  CULTURE, BLOOD (ROUTINE X 2)     Status: None   Collection Time    03/30/13  6:00 PM      Result Value Range Status   Specimen Description BLOOD RIGHT WRIST   Final   Special Requests BOTTLES DRAWN AEROBIC ONLY 5CC   Final   Culture  Setup Time 03/31/2013 03:03   Final   Culture     Final   Value:        BLOOD CULTURE RECEIVED NO GROWTH TO DATE CULTURE WILL BE HELD FOR 5 DAYS BEFORE ISSUING A FINAL NEGATIVE REPORT   Report Status PENDING   Incomplete  CULTURE, BLOOD (ROUTINE X 2)     Status: None   Collection Time    03/30/13  7:08 PM      Result Value Range Status   Specimen Description BLOOD HAND RIGHT   Final   Special Requests BOTTLES DRAWN AEROBIC ONLY 3CC   Final   Culture  Setup Time 03/31/2013 03:03   Final   Culture     Final   Value:        BLOOD CULTURE RECEIVED NO GROWTH TO DATE CULTURE WILL BE HELD FOR 5 DAYS BEFORE ISSUING A FINAL NEGATIVE REPORT   Report Status PENDING   Incomplete  URINE CULTURE     Status: None   Collection Time    03/30/13  8:59 PM      Result Value Range Status   Specimen Description URINE, CLEAN CATCH   Final   Special Requests NONE   Final   Culture  Setup Time 03/31/2013 01:50   Final   Colony Count 3,000 COLONIES/ML   Final   Culture INSIGNIFICANT  GROWTH   Final   Report Status 04/01/2013 FINAL   Final  GRAM STAIN     Status: None   Collection Time    03/30/13  8:59 PM      Result Value Range Status   Specimen Description URINE, CLEAN CATCH   Final   Special Requests NONE   Final  Gram Stain     Final   Value: CYTOSPIN     Neg for bacteria     WBC PRESENT,BOTH PMN AND MONONUCLEAR Only 2 cells seen on slide     Results Called toRayburn Ma, RN [name]   Report Status 03/30/2013 FINAL   Final  MRSA PCR SCREENING     Status: None   Collection Time    03/31/13 12:17 AM      Result Value Range Status   MRSA by PCR NEGATIVE  NEGATIVE Final   Comment:            The GeneXpert MRSA Assay (FDA     approved for NASAL specimens     only), is one component of a     comprehensive MRSA colonization     surveillance program. It is not     intended to diagnose MRSA     infection nor to guide or     monitor treatment for     MRSA infections.     Studies: No results found.  Scheduled Meds: . allopurinol  300 mg Oral Daily  . aspirin EC  81 mg Oral Daily  . atorvastatin  80 mg Oral Daily  . benztropine  1 mg Oral Daily  . clopidogrel  75 mg Oral Daily  . Ensure Plus  237 mL Oral BID BM  . ferrous sulfate  325 mg Oral BID  . gabapentin  600 mg Oral QHS  . insulin aspart  0-9 Units Subcutaneous TID WC  . magnesium oxide  400 mg Oral TID  . mirtazapine  15 mg Oral QHS  . multivitamin with minerals  1 tablet Oral Daily  . piperacillin-tazobactam (ZOSYN)  IV  3.375 g Intravenous Q8H  . polyethylene glycol  17 g Oral Daily  . potassium chloride SA  60 mEq Oral Daily  . QUEtiapine  200 mg Oral QHS  . risperiDONE  3 mg Oral BID  . sodium chloride  3 mL Intravenous Q12H  . vancomycin  750 mg Intravenous Q12H   Continuous Infusions: . sodium chloride 50 mL/hr at 04/02/13 1430    Principal Problem:   Cellulitis Active Problems:   CAD (coronary artery disease)   Hidradenitis suppurativa   Diabetes mellitus    Schizophrenia   History of CVA (cerebrovascular accident)   Anemia   Hyperlipidemia   Gout   Dementia    Time spent:    [facility]  [facility] Pager [phone]. If 7PM-7AM, please contact night-coverage at [email or web address], password [facility] 04/03/2013, 3:51 PM  LOS: 4 days

## 2013-04-03 NOTE — Progress Notes (Signed)
  Subjective: Pt with no acute changes.   Objective: Vital signs in last 24 hours: Temp:  [97.6 F (36.4 C)-98.6 F (37 C)] 97.6 F (36.4 C) (05/27 0540) Pulse Rate:  [93-101] 98 (05/27 0540) Resp:  [16-18] 16 (05/27 0540) BP: (96-113)/(61-64) 96/63 mmHg (05/27 0540) SpO2:  [100 %] 100 % (05/27 0540) Weight:  [127 lb 3.3 oz (57.7 kg)] 127 lb 3.3 oz (57.7 kg) (05/27 0526) Last BM Date: 03/30/13  Intake/Output from previous day: 05/26 0701 - 05/27 0700 In: 360 [P.O.:360] Out: 2600 [Urine:2600] Intake/Output this shift:    Incision/Wound: wound con't with purulent drainage from wound  Lab Results:   Recent Labs  04/01/13 0340  WBC 12.4*  HGB 7.8*  HCT 24.7*  PLT 468*   BMET  Recent Labs  04/01/13 0340  NA 133*  K 4.1  CL 101  CO2 25  GLUCOSE 130*  BUN 7  CREATININE 0.60  CALCIUM 8.3*   PT/INR No results found for this basename: LABPROT, INR,  in the last 72 hours ABG No results found for this basename: PHART, PCO2, PO2, HCO3,  in the last 72 hours  Studies/Results: No results found.  Anti-infectives: Anti-infectives   Start     Dose/Rate Route Frequency Ordered Stop   04/01/13 2100  vancomycin (VANCOCIN) IVPB 750 mg/150 ml premix     750 mg 150 mL/hr over 60 Minutes Intravenous Every 12 hours 04/01/13 2052     03/31/13 0800  vancomycin (VANCOCIN) IVPB 1000 mg/200 mL premix  Status:  Discontinued     1,000 mg 200 mL/hr over 60 Minutes Intravenous Every 12 hours 03/30/13 1835 04/01/13 2052   03/30/13 2200  piperacillin-tazobactam (ZOSYN) IVPB 3.375 g     3.375 g 12.5 mL/hr over 240 Minutes Intravenous 3 times per day 03/30/13 1835     03/30/13 1730  piperacillin-tazobactam (ZOSYN) IVPB 3.375 g     3.375 g 100 mL/hr over 30 Minutes Intravenous  Once 03/30/13 1724 03/30/13 2106   03/30/13 1730  vancomycin (VANCOCIN) IVPB 1000 mg/200 mL premix     1,000 mg 200 mL/hr over 60 Minutes Intravenous  Once 03/30/13 1724 03/30/13 2206       Assessment/Plan: s/p * No surgery found * Pt con't to refuse surgery at this time.  Pt is coherent enough to make his own decisions at this time, and even thought he has a POA, that would not override his decision.  Con't with Abx and wound care. Since pt refusing surgery, will sign off.  Call back with questions.      LOS: 4 days    Marigene Ehlers., [facility] 04/03/2013

## 2013-04-03 NOTE — Progress Notes (Signed)
Pt refused morning labs.  Alfonso Ellis, RN

## 2013-04-03 NOTE — Telephone Encounter (Signed)
[name], patient's sister, called because she is concerned about her brother refusing surgery. She states she has power of attorney because her brother has schizophrenia. She wants her brother to have surgery on his hidradenitis because she can no longer take care of these wounds. I told her she needs to talk with the people at the hospital but I would let Dr Derrell Lolling know she called since he saw her brother last from our office.

## 2013-04-04 LAB — GLUCOSE, CAPILLARY
Glucose-Capillary: 123 mg/dL — ABNORMAL HIGH (ref 70–99)
Glucose-Capillary: 160 mg/dL — ABNORMAL HIGH (ref 70–99)
Glucose-Capillary: 237 mg/dL — ABNORMAL HIGH (ref 70–99)

## 2013-04-04 LAB — CBC
Hemoglobin: 8.4 g/dL — ABNORMAL LOW (ref 13.0–17.0)
MCHC: 32.4 g/dL (ref 30.0–36.0)
Platelets: 448 10*3/uL — ABNORMAL HIGH (ref 150–400)
RDW: 14.9 % (ref 11.5–15.5)

## 2013-04-04 LAB — BASIC METABOLIC PANEL
BUN: 7 mg/dL (ref 6–23)
CO2: 29 mEq/L (ref 19–32)
Calcium: 9 mg/dL (ref 8.4–10.5)
Chloride: 97 mEq/L (ref 96–112)
Creatinine, Ser: 0.52 mg/dL (ref 0.50–1.35)
GFR calc Af Amer: >90 mL/min (ref >90)
GFR calc non Af Amer: >90 mL/min (ref >90)
Glucose, Bld: 133 mg/dL — ABNORMAL HIGH (ref 70–99)
Potassium: 4.1 mEq/L (ref 3.5–5.1)
Sodium: 132 mEq/L — ABNORMAL LOW (ref 135–145)

## 2013-04-04 LAB — IRON AND TIBC
Iron: 22 ug/dL — ABNORMAL LOW (ref 42–135)
Saturation Ratios: 12 % — ABNORMAL LOW (ref 20–55)
TIBC: 190 ug/dL — ABNORMAL LOW (ref 215–435)
UIBC: 168 ug/dL (ref 125–400)

## 2013-04-04 LAB — VITAMIN B12: Vitamin B-12: 722 pg/mL (ref 211–911)

## 2013-04-04 LAB — FERRITIN: Ferritin: 722 ng/mL — ABNORMAL HIGH (ref 22–322)

## 2013-04-04 NOTE — Progress Notes (Signed)
Pt's left leg, left buttocks, bilateral groin, and scrotal dsg changed per order using ABD pads and 4x4. Sites oozing bloody, mucousy, malodorous pus. Left leg/buttock dsg completely saturated from this am. Pt tolerated well states "it doesn't hurt". Will continue to monitor.

## 2013-04-04 NOTE — Care Management Note (Signed)
    Page 1 of 2   04/10/2013     2:44:37 PM   CARE MANAGEMENT NOTE 04/10/2013  Patient:  Xavier Khan, Xavier Khan   Account Number:  [id number]  Date Initiated:  04/03/2013  Documentation initiated by:  Lanaysia Fritchman  Subjective/Objective Assessment:   PT ADM ON 03/30/13 WITH HIDRADENITITS , ANEMIA.  PTA, PT IS PART OF [facility] [place], AND IS CARED FOR BY HIS SISTER, [name].     Action/Plan:   MET WITH CASE MANAGER, ASHLEY WOODS ON 04/03/13 TO DISCUSS DC PLANS.   Anticipated DC Date:  04/05/2013   Anticipated DC Plan:  SKILLED NURSING FACILITY  In-house referral  Clinical Social Worker      DC Planning Services  CM consult      Choice offered to / List presented to:             Status of service:  Completed, signed off Medicare Important Message given?   (If response is "NO", the following Medicare IM given date fields will be blank) Date Medicare IM given:   Date Additional Medicare IM given:    Discharge Disposition:  SKILLED NURSING FACILITY  Per UR Regulation:  Reviewed for med. necessity/level of care/duration of stay  If discussed at Long Length of Stay Meetings, dates discussed:    Comments:  04/10/13 Rosalita Chessman [phone] PT DISCHARGING TO SNF TODAY,PER CSW ARRANGEMENTS.  04/05/13 Stephie Xu,RN,BSN [phone] CSW FOLLOWING FOR DC TO SKILLED NURSING FACILITY.  04/03/13 Marquisha Nikolov,RN,BSN [phone] MET WITH ASHLEY WOODS, CASE MGR FOR PT THROUGH [facility].  (PHONE [phone] OR [phone]).  PT IS PART OF [facility] [place], WHICH PROVIDES PRIMARY CARE, HH, MEDS, ETC.  RN CASE MANAGER STATES PT GOES TO THE ADULT DAY PROGRAM ONE DAY A WEEK, BUT IS ELIGIBLE FOR MORE SERVICES. HE REFUSES SOME ASPECTS OF MEDICAL CARE WHILE AT THE CENTER, SHE STATES.  WE DISCUSSED OPTIONS FOR PT IF [name] IS UNABLE TO TAKE HIM HOME.  SHE STATES THAT [facility] IS CONTRACTED WITH [facility] AND [facility] IN [place].  PT MAY NEED SNF FOR 24HR CARE AND INCREASED NURSING NEEDS.  P.T. TO  EVALUATE.  WILL CONSULT CSW TO ASSIST WITH DISPOSITION.

## 2013-04-04 NOTE — Consult Note (Signed)
Reason for Consult: capacity evaluation Referring Physician: Dr. Sherron Flemings is an 57 y.o. male.  HPI: Patient was seen and chart reviewed. Case discussed with patient staff RN. Psychiatric consultation service called in for capacity evaluation for surgical procedure. Patient has been suffering with multiple medical problems and psychiatric illness. He was schizophrenia, cognitive deficits, and was previously deemed incapacitated and family members obtain POA. Patient has limited cognition but does not meet criteria for capacity to make medical decision at this time because he won't able to answer regarding his medical condition, treatment needs and risks and benefits of treatment and no treatment. It is deemed that surgical intervention is needed, without it his health condition will not improve.Reportedly patient family advocating for the procedure because his condition failed to respond to non invasive treatment over one month and patient may agree with his family but can not make individual decision based on his current mental status and cognition.   Medical History: Patient is a 57 y.o. male with known history of hidradenitis suppuritiva, CAD status post stenting, colon cancer, status post surgery last year, CVA with left-sided hemiplegia,  chronic anemia was found to have increasing bleeding from his groin area where patient has hidranitis suppuritiva.    MSE: Patient has limited cognition and slow and delayed responses for the queries. He was unable to answer questions regarding his health condition and treatment needs. He is calm, cooperative and has fair orientation that he is in [place] and he lives with his family member and sees a psychiatrist but can not give more details when asked.   Past Medical History  Diagnosis Date  . Diabetes mellitus with polyneuropathy   . Blood transfusion without reported diagnosis   . Anemia   . Adenocarcinoma, colon     Colon  . Gout   .  CAD (coronary artery disease)   . MI, old   . CVA (cerebral infarction)   . Hemiplegia, post-stroke   . Protein calorie malnutrition   . Stable angina   . Suppurative hidradenitis     ch    Past Surgical History  Procedure Laterality Date  . Cardiac surgery      Stent 2013  . Coloscop    . Status post hemicolectomy      February 2013 at [facility]    History reviewed. No pertinent family history.  Social History:  reports that he has been smoking Cigarettes.  He has been smoking about 0.00 packs per day. He does not have any smokeless tobacco history on file. He reports that he does not drink alcohol or use illicit drugs.  Allergies:  Allergies  Allergen Reactions  . Asa (Aspirin)     Medications: I have reviewed the patient's current medications.  Results for orders placed during the hospital encounter of 03/30/13 (from the past 48 hour(s))  GLUCOSE, CAPILLARY     Status: Abnormal   Collection Time    04/02/13 11:43 AM      Result Value Range   Glucose-Capillary 238 (*) 70 - 99 mg/dL   Comment 1 Documented in Chart     Comment 2 Notify RN    GLUCOSE, CAPILLARY     Status: Abnormal   Collection Time    04/02/13  4:26 PM      Result Value Range   Glucose-Capillary 103 (*) 70 - 99 mg/dL   Comment 1 Documented in Chart     Comment 2 Notify RN    GLUCOSE, CAPILLARY  Status: Abnormal   Collection Time    04/02/13  9:07 PM      Result Value Range   Glucose-Capillary 196 (*) 70 - 99 mg/dL   Comment 1 Documented in Chart     Comment 2 Notify RN    GLUCOSE, CAPILLARY     Status: Abnormal   Collection Time    04/03/13  5:46 AM      Result Value Range   Glucose-Capillary 202 (*) 70 - 99 mg/dL  GLUCOSE, CAPILLARY     Status: Abnormal   Collection Time    04/03/13 11:48 AM      Result Value Range   Glucose-Capillary 240 (*) 70 - 99 mg/dL  GLUCOSE, CAPILLARY     Status: Abnormal   Collection Time    04/03/13  4:14 PM      Result Value Range   Glucose-Capillary 181 (*)  70 - 99 mg/dL  COMPREHENSIVE METABOLIC PANEL     Status: Abnormal   Collection Time    04/03/13  5:48 PM      Result Value Range   Sodium 129 (*) 135 - 145 mEq/L   Potassium 4.7  3.5 - 5.1 mEq/L   Chloride 94 (*) 96 - 112 mEq/L   CO2 24  19 - 32 mEq/L   Glucose, Bld 211 (*) 70 - 99 mg/dL   BUN 7  6 - 23 mg/dL   Creatinine, Ser 1.61  0.50 - 1.35 mg/dL   Calcium 8.8  8.4 - 09.6 mg/dL   Total Protein 9.8 (*) 6.0 - 8.3 g/dL   Albumin 1.6 (*) 3.5 - 5.2 g/dL   AST 14  0 - 37 U/L   ALT 14  0 - 53 U/L   Alkaline Phosphatase 191 (*) 39 - 117 U/L   Total Bilirubin 0.4  0.3 - 1.2 mg/dL   GFR calc non Af Amer >90  >90 mL/min   GFR calc Af Amer >90  >90 mL/min   Comment:            The eGFR has been calculated     using the CKD EPI equation.     This calculation has not been     validated in all clinical     situations.     eGFR's persistently     <90 mL/min signify     possible Chronic Kidney Disease.  CBC     Status: Abnormal   Collection Time    04/03/13  5:48 PM      Result Value Range   WBC 12.4 (*) 4.0 - 10.5 K/uL   RBC 2.86 (*) 4.22 - 5.81 MIL/uL   Hemoglobin 8.7 (*) 13.0 - 17.0 g/dL   HCT 04.5 (*) 40.9 - 81.1 %   MCV 92.7  78.0 - 100.0 fL   MCH 30.4  26.0 - 34.0 pg   MCHC 32.8  30.0 - 36.0 g/dL   RDW 91.4  78.2 - 95.6 %   Platelets 482 (*) 150 - 400 K/uL  VITAMIN B12     Status: None   Collection Time    04/03/13  5:48 PM      Result Value Range   Vitamin B-12 722  211 - 911 pg/mL  IRON AND TIBC     Status: Abnormal   Collection Time    04/03/13  5:48 PM      Result Value Range   Iron 22 (*) 42 - 135 ug/dL   TIBC 213 (*) 086 -  435 ug/dL   Saturation Ratios 12 (*) 20 - 55 %   UIBC 168  125 - 400 ug/dL  FERRITIN     Status: Abnormal   Collection Time    04/03/13  5:48 PM      Result Value Range   Ferritin 722 (*) 22 - 322 ng/mL  RETICULOCYTES     Status: Abnormal   Collection Time    04/03/13  5:48 PM      Result Value Range   Retic Ct Pct 3.1  0.4 - 3.1 %    RBC. 2.86 (*) 4.22 - 5.81 MIL/uL   Retic Count, Manual 88.7  19.0 - 186.0 K/uL  GLUCOSE, CAPILLARY     Status: Abnormal   Collection Time    04/03/13  8:07 PM      Result Value Range   Glucose-Capillary 218 (*) 70 - 99 mg/dL  GLUCOSE, CAPILLARY     Status: Abnormal   Collection Time    04/04/13 12:02 AM      Result Value Range   Glucose-Capillary 237 (*) 70 - 99 mg/dL  CBC     Status: Abnormal   Collection Time    04/04/13  3:50 AM      Result Value Range   WBC 11.0 (*) 4.0 - 10.5 K/uL   RBC 2.82 (*) 4.22 - 5.81 MIL/uL   Hemoglobin 8.4 (*) 13.0 - 17.0 g/dL   HCT 40.9 (*) 81.1 - 91.4 %   MCV 91.8  78.0 - 100.0 fL   MCH 29.8  26.0 - 34.0 pg   MCHC 32.4  30.0 - 36.0 g/dL   RDW 78.2  95.6 - 21.3 %   Platelets 448 (*) 150 - 400 K/uL  BASIC METABOLIC PANEL     Status: Abnormal   Collection Time    04/04/13  3:50 AM      Result Value Range   Sodium 132 (*) 135 - 145 mEq/L   Potassium 4.1  3.5 - 5.1 mEq/L   Chloride 97  96 - 112 mEq/L   CO2 29  19 - 32 mEq/L   Glucose, Bld 133 (*) 70 - 99 mg/dL   BUN 7  6 - 23 mg/dL   Creatinine, Ser 0.86  0.50 - 1.35 mg/dL   Calcium 9.0  8.4 - 57.8 mg/dL   GFR calc non Af Amer >90  >90 mL/min   GFR calc Af Amer >90  >90 mL/min   Comment:            The eGFR has been calculated     using the CKD EPI equation.     This calculation has not been     validated in all clinical     situations.     eGFR's persistently     <90 mL/min signify     possible Chronic Kidney Disease.  GLUCOSE, CAPILLARY     Status: Abnormal   Collection Time    04/04/13  4:26 AM      Result Value Range   Glucose-Capillary 123 (*) 70 - 99 mg/dL  GLUCOSE, CAPILLARY     Status: Abnormal   Collection Time    04/04/13  8:09 AM      Result Value Range   Glucose-Capillary 108 (*) 70 - 99 mg/dL    No results found.  Positive for learning difficulty and limited cognition and chronic mental illness. Blood pressure 96/58, pulse 102, temperature 98.1 F (36.7 C),  temperature source Oral, resp. rate  18, height 5\' 4"  (1.626 m), weight 125 lb 3.5 oz (56.8 kg), SpO2 98.00%.   Assessment/Plan: Patient does not meet capacity for consenting for proposed treatment / surgery by his primary medical team as he can not understand the whole health condition, risk and benefits of treatment and no treatment. Recommend to obtain consent from POA for the procedure. May contact psych social service if needed further assistance regarding process of POA. Appreciate psych consult.  Jarron Curley,JANARDHAHA R. 04/04/2013, 11:28 AM

## 2013-04-04 NOTE — Clinical Social Work Psychosocial (Addendum)
    Clinical Social Work Department BRIEF PSYCHOSOCIAL ASSESSMENT 04/04/2013  Patient:  Xavier Khan, Xavier Khan     Account Number:  [id number]     Admit date:  03/30/2013  Clinical Social Worker:  Hulan Fray  Date/Time:  04/04/2013 10:51 AM  Referred by:  Care Management  Date Referred:  04/04/2013 Referred for  SNF Placement   Other Referral:   Interview type:  Family Other interview type:   Sister- Xavier Khan [phone])    PSYCHOSOCIAL DATA Living Status:  FAMILY Admitted from facility:   Level of care:   Primary support name:  Xavier Khan Primary support relationship to patient:  SIBLING Degree of support available:   supportive    CURRENT CONCERNS Current Concerns  Post-Acute Placement   Other Concerns:    SOCIAL WORK ASSESSMENT / PLAN Clinical Social Worker received referral for patient needing short term SNF placement at discharge. CSW spoke with sister who informed CSW that patient is part of [facility]. Sister reported that she is interested in [facility] in [place]. Sister informed CSW that there is a [facility] SW [facility] [phone]). CSW left two voice messages for[facility] SW to return call regarding if there are certain facilities they contract with.    CSW will complete FL2 for MD's signature and will initiate SNF search. CSW will update family when bed offers are made.   Assessment/plan status:  Psychosocial Support/Ongoing Assessment of Needs Other assessment/ plan:   14:26pm CSW received call from Knob Noster at [facility] and she is requesting information to be sent to her. She informed CSW that SNF will need to be discussed with his treatment team at their program. She informed CSW that they do have contracts with [facility] and [facility]. Mardella Layman is to call CSW back regarding whether or not CSW is to proceed with SNF referral. Information/referral to community resources:   Patient is part of [facility] program.    PATIENT'S/FAMILY'S  RESPONSE TO PLAN OF CARE: Family is agreeable for short term SNF placement at discharge. Per sister, she is interested in [facility] if they have availability.

## 2013-04-04 NOTE — Progress Notes (Signed)
[facility] PROGRESS NOTE  Xavier Khan AVW:[id number] DOB: [birth year] DOA: 03/30/2013 PCP: No PCP Per Patient Brief Narrative: Xavier Khan is a 57 y.o. male with known history of hidradenitis suppuritiva, CAD status post stenting, colon cancer status post surgery last year, CVA with left-sided hemiplegia, schizophrenia, cognitive deficits, chronic anemia was found to have increasing bleeding from his groin area where patient has hidranitis suppuritiva. As per patient's sister with whom patient lives patient was being treated with multiple does of antibiotics over the last one month it to increased drainage from the groin area. Patient also has been having slow blood loss from the area. Today patient was noticed to have more than usual blood loss and patient was felt to be weak. On arrival in the ER patient was found to be tachycardic and tachypneic and it improved with fluids. On exam patient had increase drainage from the groin area with blood. On-call surgeon was consulted and at this time patient has been admitted for worsening of is hidradenitis suppurativa   Assessment/Plan: 1. Hidradenitis suppurativa, Acute on Chronic infection - CT pelvis noted -S/p 5 days of IV Vanc/Zosyn, change to PO augmentin and Doxycycline -CCS signed off, though will need debridement but pt refused this although sister is PoA and wants him to have surgery she finally understands that pt still is able to decline surgery -wound care consult noted -Pts family is requesting another 24 hours to convince the pt about the need for surgery and if he still declines then he will be discharged home or to SNF. Psychiatry consulted and pending.   2. Anemia - with blood loss from 1 and hemodilution,  -no GI bleeding noted, Anemia panel showed elevated ferritin, low saturation ratio's and low iron levels.    3. History of CVA with left-sided hemiplegia - continue plavix  4. Diabetes mellitus type 2 - with hypoglycemic  episodes -stopped oral hypoglycemics, SSI. CBG (last 3)   Recent Labs  04/03/13 2007 04/04/13 0002 04/04/13 0426  GLUCAP 218* 237* 123*      5. History of colon cancer status post resection last year.  6. Schizophrenia - continue cogentin, seroquel and risperdal. Psychiatry consulted and pending.   8.    Hyperlipidemia - continue statin  9.     Gout - continue allopurinol.  Code Status: FULL Family Communication: d/w none at bedside and Meet with family 5/27 at 4pm at bedside Disposition Plan: Home Vs SNF tomorrow if he declines surgery after 24H   Consultants:  CCS  Antibiotics:  Vanc 5/23 to 5/27  Zosyn 5/23 to 5/27  HPI/Subjective: Sleepy,   Objective: Filed Vitals:   04/03/13 1100 04/03/13 1608 04/03/13 2110 04/04/13 0433  BP: 102/53 98/60 114/74 96/58  Pulse: 93 98 101 102  Temp:  98.5 F (36.9 C) 99.2 F (37.3 C) 98.1 F (36.7 C)  TempSrc:  Oral Oral Oral  Resp:   18 18  Height:      Weight:    56.8 kg (125 lb 3.5 oz)  SpO2:  100% 99% 98%    Intake/Output Summary (Last 24 hours) at 04/04/13 0736 Last data filed at 04/03/13 2100  Gross per 24 hour  Intake    480 ml  Output   1600 ml  Net  -1120 ml   Filed Weights   04/02/13 0500 04/03/13 0526 04/04/13 0433  Weight: 58.6 kg (129 lb 3 oz) 57.7 kg (127 lb 3.3 oz) 56.8 kg (125 lb 3.5 oz)    Exam:  General:  Alert, awake,  Oriented to self and person  Cardiovascular: S1S2/RRR  Respiratory: CTAB, diminished at bases  Abdomen: soft, NT, BS present Perineum: Thick, dark  induration of the left buttock, bilateral perineum, proximal medial thighs with small openings  Data Reviewed: Basic Metabolic Panel:  Recent Labs Lab 03/30/13 1606 03/31/13 0530 04/01/13 0340 04/03/13 1748 04/04/13 0350  NA 132* 134* 133* 129* 132*  K 3.7 3.6 4.1 4.7 4.1  CL 94* 101 101 94* 97  CO2 26 23 25 24 29   GLUCOSE 61* 47* 130* 211* 133*  BUN 6 4* 7 7 7   CREATININE 0.58 0.54 0.60 0.52 0.52  CALCIUM  9.1 8.4 8.3* 8.8 9.0   Liver Function Tests:  Recent Labs Lab 03/30/13 1606 03/31/13 0530 04/03/13 1748  AST 20 15 14   ALT 19 14 14   ALKPHOS 218* 172* 191*  BILITOT 0.5 0.5 0.4  PROT 11.4* 8.9* 9.8*  ALBUMIN 2.1* 1.6* 1.6*   No results found for this basename: LIPASE, AMYLASE,  in the last 168 hours No results found for this basename: AMMONIA,  in the last 168 hours CBC:  Recent Labs Lab 03/30/13 2214 03/31/13 0530 04/01/13 0340 04/03/13 1748 04/04/13 0350  WBC 10.7* 10.8* 12.4* 12.4* 11.0*  HGB 9.0* 8.9* 7.8* 8.7* 8.4*  HCT 27.6* 27.7* 24.7* 26.5* 25.9*  MCV 91.7 92.0 93.6 92.7 91.8  PLT 479* 476* 468* 482* 448*   Cardiac Enzymes: No results found for this basename: CKTOTAL, CKMB, CKMBINDEX, TROPONINI,  in the last 168 hours BNP (last 3 results) No results found for this basename: PROBNP,  in the last 8760 hours CBG:  Recent Labs Lab 04/03/13 1148 04/03/13 1614 04/03/13 2007 04/04/13 0002 04/04/13 0426  GLUCAP 240* 181* 218* 237* 123*    Recent Results (from the past 240 hour(s))  CULTURE, BLOOD (ROUTINE X 2)     Status: None   Collection Time    03/30/13  6:00 PM      Result Value Range Status   Specimen Description BLOOD RIGHT WRIST   Final   Special Requests BOTTLES DRAWN AEROBIC ONLY 5CC   Final   Culture  Setup Time 03/31/2013 03:03   Final   Culture     Final   Value:        BLOOD CULTURE RECEIVED NO GROWTH TO DATE CULTURE WILL BE HELD FOR 5 DAYS BEFORE ISSUING A FINAL NEGATIVE REPORT   Report Status PENDING   Incomplete  CULTURE, BLOOD (ROUTINE X 2)     Status: None   Collection Time    03/30/13  7:08 PM      Result Value Range Status   Specimen Description BLOOD HAND RIGHT   Final   Special Requests BOTTLES DRAWN AEROBIC ONLY 3CC   Final   Culture  Setup Time 03/31/2013 03:03   Final   Culture     Final   Value:        BLOOD CULTURE RECEIVED NO GROWTH TO DATE CULTURE WILL BE HELD FOR 5 DAYS BEFORE ISSUING A FINAL NEGATIVE REPORT   Report  Status PENDING   Incomplete  URINE CULTURE     Status: None   Collection Time    03/30/13  8:59 PM      Result Value Range Status   Specimen Description URINE, CLEAN CATCH   Final   Special Requests NONE   Final   Culture  Setup Time 03/31/2013 01:50   Final   Colony Count 3,000 COLONIES/ML  Final   Culture INSIGNIFICANT GROWTH   Final   Report Status 04/01/2013 FINAL   Final  GRAM STAIN     Status: None   Collection Time    03/30/13  8:59 PM      Result Value Range Status   Specimen Description URINE, CLEAN CATCH   Final   Special Requests NONE   Final   Gram Stain     Final   Value: CYTOSPIN     Neg for bacteria     WBC PRESENT,BOTH PMN AND MONONUCLEAR Only 2 cells seen on slide     Results Called toRayburn Ma, RN [name]   Report Status 03/30/2013 FINAL   Final  MRSA PCR SCREENING     Status: None   Collection Time    03/31/13 12:17 AM      Result Value Range Status   MRSA by PCR NEGATIVE  NEGATIVE Final   Comment:            The GeneXpert MRSA Assay (FDA     approved for NASAL specimens     only), is one component of a     comprehensive MRSA colonization     surveillance program. It is not     intended to diagnose MRSA     infection nor to guide or     monitor treatment for     MRSA infections.     Studies: No results found.  Scheduled Meds: . allopurinol  300 mg Oral Daily  . amoxicillin-clavulanate  1 tablet Oral BID WC  . aspirin EC  81 mg Oral Daily  . atorvastatin  80 mg Oral Daily  . benztropine  1 mg Oral Daily  . clopidogrel  75 mg Oral Daily  . doxycycline  100 mg Oral Q12H  . Ensure Plus  237 mL Oral BID BM  . ferrous sulfate  325 mg Oral BID  . gabapentin  600 mg Oral QHS  . insulin aspart  0-9 Units Subcutaneous TID WC  . magnesium oxide  400 mg Oral TID  . mirtazapine  15 mg Oral QHS  . multivitamin with minerals  1 tablet Oral Daily  . polyethylene glycol  17 g Oral Daily  . potassium chloride SA  60 mEq Oral Daily  .  QUEtiapine  200 mg Oral QHS  . risperiDONE  3 mg Oral BID  . sodium chloride  3 mL Intravenous Q12H   Continuous Infusions: . sodium chloride 50 mL/hr at 04/04/13 0454    Principal Problem:   Cellulitis Active Problems:   CAD (coronary artery disease)   Hidradenitis suppurativa   Diabetes mellitus   Schizophrenia   History of CVA (cerebrovascular accident)   Anemia   Hyperlipidemia   Gout   Dementia    Time spent:    [facility]  [facility] Pager [phone]. If 7PM-7AM, please contact night-coverage at [email or web address], password [email or web address] 04/04/2013, 7:36 AM  LOS: 5 days

## 2013-04-04 NOTE — Progress Notes (Signed)
Physical Therapy Treatment Patient Details Name: Xavier Khan MRN: [id number] DOB: 1956-02-01 Today's Date: 04/04/2013 Time: 7829-5621 PT Time Calculation (min): 19 min  PT Assessment / Plan / Recommendation Comments on Treatment Session  Pt was  not as aroused and conversive today per family. Pt required much coercing to get up and walk. Surgical plans remain uncertain/    Follow Up Recommendations  SNF;Supervision/Assistance - 24 hour     Does the patient have the potential to tolerate intense rehabilitation     Barriers to Discharge        Equipment Recommendations       Recommendations for Other Services    Frequency Min 3X/week   Plan Discharge plan remains appropriate;Frequency remains appropriate    Precautions / Restrictions Precautions Precautions: Fall Precaution Comments: contact isolation Restrictions Weight Bearing Restrictions: No   Pertinent Vitals/Pain     Mobility  Bed Mobility Bed Mobility: Supine to Sit;Sitting - Scoot to Edge of Bed Supine to Sit: 1: +2 Total assist;HOB elevated Supine to Sit: Patient Percentage: 20% Sitting - Scoot to Edge of Bed: 1: +2 Total assist Sitting - Scoot to Edge of Bed: Patient Percentage: 20% Details for Bed Mobility Assistance: assist to bring legs off bed and lift trunk Transfers Sit to Stand: 1: +2 Total assist;From bed Sit to Stand: Patient Percentage: 20% Stand to Sit: 1: +2 Total assist;Without upper extremity assist;To chair/3-in-1 Stand to Sit: Patient Percentage: 20% Details for Transfer Assistance: assist for anterior weight shift and to sit to flex at hips and knees Ambulation/Gait Ambulation/Gait Assistance: 1: +2 Total assist Ambulation/Gait: Patient Percentage: 50% Ambulation Distance (Feet): 8 Feet Assistive device: 2 person hand held assist Ambulation/Gait Assistance Details: Pt required much encouragement by family to walk. Once standing and taking steps, gait/balance improved Gait Pattern: Wide  base of support;Trunk flexed;Decreased hip/knee flexion - right;Decreased hip/knee flexion - left;Shuffle;Decreased stride length;Step-through pattern;Decreased trunk rotation    Exercises     PT Diagnosis:    PT Problem List:   PT Treatment Interventions:     PT Goals Acute Rehab PT Goals Pt will Roll Supine to Left Side: with min assist PT Goal: Rolling Supine to Left Side - Progress: Progressing toward goal Pt will go Supine/Side to Sit: with min assist PT Goal: Supine/Side to Sit - Progress: Progressing toward goal Pt will go Sit to Stand: with min assist PT Goal: Sit to Stand - Progress: Progressing toward goal Pt will Transfer Bed to Chair/Chair to Bed: with min assist PT Transfer Goal: Bed to Chair/Chair to Bed - Progress: Progressing toward goal Pt will Ambulate: 16 - 50 feet;with least restrictive assistive device;with min assist PT Goal: Ambulate - Progress: Progressing toward goal  Visit Information  Last PT Received On: 04/04/13 Assistance Needed: +2    Subjective Data  Subjective: no, I don't want to get up.   Cognition  Cognition Arousal/Alertness: Awake/alert Behavior During Therapy: Flat affect Overall Cognitive Status: Impaired/Different from baseline Area of Impairment: Orientation;Safety/judgement;Problem solving Orientation Level: Disoriented to;Situation Safety/Judgement: Decreased awareness of deficits Problem Solving: Slow processing;Decreased initiation;Requires tactile cues;Requires verbal cues;Difficulty sequencing    Balance  Balance Balance Assessed: Yes Static Sitting Balance Static Sitting - Balance Support: Feet supported;Right upper extremity supported Static Sitting - Level of Assistance: 4: Min assist Static Sitting - Comment/# of Minutes: 2 Static Standing Balance Static Standing - Balance Support: Right upper extremity supported;Left upper extremity supported Static Standing - Level of Assistance: 1: +2 Total assist;Patient percentage  (comment) (50)  End  of Session PT - End of Session Patient left: in chair;with call bell/phone within reach;with family/visitor present Nurse Communication: Mobility status   GP     Rada Hay 04/04/2013, 12:44 PM

## 2013-04-04 NOTE — Evaluation (Signed)
Occupational Therapy Evaluation Patient Details Name: Xavier Khan MRN: [id number] DOB: 31-Dec-1955 Today's Date: 04/04/2013 Time: 9562-1308 OT Time Calculation (min): 23 min  OT Assessment / Plan / Recommendation Clinical Impression  Pt admitted with hidradenitis suppuritiva, CAD status post stenting, colon cancer status post surgery last year, CVA with left-sided hemiplegia, schizophrenia, cognitive deficits, chronic anemia was found to have increasing bleeding/draining from his groin area.  Pt presents with flat affect, difficulty following directions, dependence in all ADL, and +2 total assist for mobility. Will follow acutely.  Pt to have psych consult to determine capacity to determine his own medical management. His brothers and sisters are very involved in his care and want pt to have the recommended surgery.      OT Assessment  Patient needs continued OT Services    Follow Up Recommendations  SNF;Supervision/Assistance - 24 hour    Barriers to Discharge      Equipment Recommendations  3 in 1 bedside comode;Wheelchair (measurements OT);Wheelchair cushion (measurements OT) (if going home)    Recommendations for Other Services    Frequency  Min 2X/week    Precautions / Restrictions Precautions Precautions: Fall Precaution Comments: contact isolation Restrictions Weight Bearing Restrictions: No   Pertinent Vitals/Pain Reports pain in his groin, but declined medication, repositioned    ADL  Transfers/Ambulation Related to ADLs: +2 total assist pt 50% to ambulate with R UE supported ADL Comments: Pt is dependent in all aspects of ADL.  Appears with flat affect, catatonic-like behavior.    OT Diagnosis: Generalized weakness;Cognitive deficits;Acute pain;Hemiplegia non-dominant side  OT Problem List: Decreased strength;Decreased range of motion;Decreased activity tolerance;Impaired balance (sitting and/or standing);Decreased coordination;Decreased cognition;Decreased safety  awareness;Decreased knowledge of use of DME or AE;Impaired tone;Impaired UE functional use OT Treatment Interventions: Self-care/ADL training;DME and/or AE instruction;Therapeutic activities;Patient/family education;Cognitive remediation/compensation   OT Goals Acute Rehab OT Goals OT Goal Formulation: Patient unable to participate in goal setting Time For Goal Achievement: 04/18/13 Potential to Achieve Goals: Good ADL Goals Pt Will Perform Eating: Sitting, chair;with min assist ADL Goal: Eating - Progress: Goal set today Pt Will Perform Grooming: with min assist;Sitting, edge of bed ADL Goal: Grooming - Progress: Goal set today Pt Will Transfer to Toilet: with min assist;Ambulation;with DME ADL Goal: Toilet Transfer - Progress: Goal set today Miscellaneous OT Goals Miscellaneous OT Goal #1: Pt will perform bed mobility with mod assist to attain EOB for ADL. OT Goal: Miscellaneous Goal #1 - Progress: Goal set today Miscellaneous OT Goal #2: Pt will follow simple one step instructions with min physical and verbal cues 75% of time. OT Goal: Miscellaneous Goal #2 - Progress: Goal set today  Visit Information  Last OT Received On: 04/04/13 Assistance Needed: +2 PT/OT Co-Evaluation/Treatment: Yes    Subjective Data  Subjective: "No, I don't want to walk."   Prior Functioning     Home Living Lives With: Family Available Help at Discharge: Family;Available PRN/intermittently Type of Home: House Home Access: Stairs to enter [employer] of Steps: 5 Entrance Stairs-Rails: Right;Left;Can reach both Home Layout: Two level;Able to live on main level with bedroom/bathroom Bathroom Shower/Tub: Engineer, manufacturing systems: Standard Home Adaptive Equipment: Quad cane Prior Function Level of Independence: Independent with assistive device(s) Able to Take Stairs?: Yes Driving: No Communication Communication: No difficulties (very little verbalization) Dominant Hand:  Right         Vision/Perception     Cognition  Cognition Arousal/Alertness: Awake/alert Behavior During Therapy: Flat affect Overall Cognitive Status: Impaired/Different from baseline Area of Impairment: Orientation;Safety/judgement;Problem  solving Orientation Level: Disoriented to;Situation Safety/Judgement: Decreased awareness of deficits Problem Solving: Slow processing;Decreased initiation;Requires tactile cues;Requires verbal cues;Difficulty sequencing    Extremity/Trunk Assessment Right Upper Extremity Assessment RUE ROM/Strength/Tone: Deficits RUE ROM/Strength/Tone Deficits: AAROM WFL, limited active movement with stiffness throughout joints, strength at least 3/5. Left Upper Extremity Assessment LUE ROM/Strength/Tone: Deficits;Due to pain;Unable to fully assess LUE ROM/Strength/Tone Deficits: h/o CVA left hemiparesis with hand contractures, and c/o pain when trying to extend at elbow Trunk Assessment Trunk Assessment: Other exceptions Trunk Exceptions: severely rigid in trunk and neck with even PROM difficult     Mobility Bed Mobility Bed Mobility: Supine to Sit;Sitting - Scoot to Edge of Bed Supine to Sit: 1: +2 Total assist;HOB elevated Supine to Sit: Patient Percentage: 20% Sitting - Scoot to Edge of Bed: 1: +2 Total assist Sitting - Scoot to Edge of Bed: Patient Percentage: 20% Details for Bed Mobility Assistance: assist to bring legs off bed and lift trunk Transfers Transfers: Sit to Stand;Stand to Sit Sit to Stand: 1: +2 Total assist;From bed Sit to Stand: Patient Percentage: 20% Stand to Sit: 1: +2 Total assist;Without upper extremity assist;To chair/3-in-1 Stand to Sit: Patient Percentage: 20% Details for Transfer Assistance: assist for anterior weight shift and to sit to flex at hips and knees     Exercise     Balance Balance Balance Assessed: Yes Static Sitting Balance Static Sitting - Balance Support: Feet supported;Right upper extremity  supported Static Sitting - Level of Assistance: 4: Min assist Static Sitting - Comment/# of Minutes: 2 Static Standing Balance Static Standing - Balance Support: Right upper extremity supported;Left upper extremity supported Static Standing - Level of Assistance: 1: +2 Total assist;Patient percentage (comment) (50)   End of Session OT - End of Session Activity Tolerance: Patient tolerated treatment well Patient left: in chair;with call bell/phone within reach;with family/visitor present;with nursing in room Nurse Communication: Mobility status  GO     Evern Bio 04/04/2013, 11:35 AM [phone]

## 2013-04-04 NOTE — Clinical Documentation Improvement (Signed)
GENERIC DOCUMENTATION CLARIFICATION QUERY  THIS DOCUMENT IS NOT A PERMANENT PART OF THE MEDICAL RECORD  TO RESPOND TO THE THIS QUERY, FOLLOW THE INSTRUCTIONS BELOW:  1. If needed, update documentation for the patient's encounter via the notes activity.  2. Access this query again and click edit on the In [employer].  3. After updating, or not, click F2 to complete all highlighted (required) fields concerning your review. Select "additional documentation in the medical record" OR "no additional documentation provided".  4. Click Sign note button.  5. The deficiency will fall out of your In Basket *Please let us know if you are not able to complete this workflow by phone or e-mail (listed below).  Please update your documentation within the medical record to reflect your response to this query.                                                                                        04/04/13   Dear Dr. Blake Divine / Associates,  In a better effort to capture your patient's severity of illness, reflect appropriate length of stay and utilization of resources, a review of the patient medical record has revealed the following indicators.    Based on your clinical judgment, please clarify and document in a progress note and/or discharge summary the clinical condition associated with the following supporting information:  In responding to this query please exercise your independent judgment.  The fact that a query is asked, does not imply that any particular answer is desired or expected.    Possible Clinical Conditions?  _______Hyponatremia   _______Other Condition  _______Cannot Clinically Determine       Risk Factors:  Diagnostics: Sodium: 5/27:  129 5/28:  132  Treatment: 5/28:  0.9% NaCl @ 14ml/hr   You may use possible, probable, or suspect with inpatient documentation. possible, probable, suspected diagnoses MUST be documented at the time of discharge  Reviewed: Mild  hyponatremia documented per 5/31 progress notes.  Thank You,  Rodman Pickle, RN,BSN,  Clinical Documentation Specialist:  Phone: [phone]  Health Information Management [place]

## 2013-04-05 LAB — CBC
MCH: 30.9 pg (ref 26.0–34.0)
MCV: 92.6 fL (ref 78.0–100.0)
Platelets: 438 10*3/uL — ABNORMAL HIGH (ref 150–400)
RDW: 15.1 % (ref 11.5–15.5)
WBC: 14.4 10*3/uL — ABNORMAL HIGH (ref 4.0–10.5)

## 2013-04-05 LAB — GLUCOSE, CAPILLARY
Glucose-Capillary: 114 mg/dL — ABNORMAL HIGH (ref 70–99)
Glucose-Capillary: 164 mg/dL — ABNORMAL HIGH (ref 70–99)
Glucose-Capillary: 166 mg/dL — ABNORMAL HIGH (ref 70–99)

## 2013-04-05 LAB — HEMOGLOBIN AND HEMATOCRIT, BLOOD: Hemoglobin: 8.2 g/dL — ABNORMAL LOW (ref 13.0–17.0)

## 2013-04-05 MED ORDER — SENNOSIDES-DOCUSATE SODIUM 8.6-50 MG PO TABS
1.0000 | ORAL_TABLET | Freq: Two times a day (BID) | ORAL | Status: DC
  Administered 2013-04-05 – 2013-04-10 (×11): 1 via ORAL
  Filled 2013-04-05 (×13): qty 1

## 2013-04-05 MED ORDER — BISACODYL 10 MG RE SUPP: 10.0000 mg | Freq: Every day | RECTAL | Status: DC | PRN

## 2013-04-05 MED ORDER — LACTULOSE 10 GM/15ML PO SOLN
10.0000 g | Freq: Once | ORAL | Status: AC
  Administered 2013-04-05: 10 g via ORAL
  Filled 2013-04-05 (×2): qty 15

## 2013-04-05 MED ORDER — ENSURE COMPLETE PO LIQD
237.0000 mL | Freq: Two times a day (BID) | ORAL | Status: DC
  Administered 2013-04-05 – 2013-04-10 (×10): 237 mL via ORAL

## 2013-04-05 NOTE — Clinical Social Work Note (Addendum)
Clinical Child psychotherapist received call from [place], [place] for [facility] and she reported that they are approving the short term SNF stay. Per Mardella Layman they are going to be providing therapy at their center, but will need the SNF for room and board and medication management. CSW will initiate SNF search to the two contracted facilities, [facility] and [facility]. Mardella Layman is going to fax sister's legal guardianship papers they have on file to CSW and CSW will place in shadow chart.   13:04pm CSW received guardianship documents and noticed that original document is placed in shadow chart already.   Rozetta Nunnery MSW, [employer] [phone]

## 2013-04-05 NOTE — Clinical Social Work Psych Note (Addendum)
Psych CSW reviewed chart.  Capacity has been documented by Psychiatrist, Dr. Elsie Saas dated 04/04/2013 8:47PM.  States, "patient does not meet capacity for consenting for proposed treatment / surgery by his primary medical team as he can not understand the whole health condition, risk and benefits of treatment and no treatment."  Vickii Penna, LCSWA [phone]  Clinical Social Work

## 2013-04-05 NOTE — Progress Notes (Signed)
Pt's dsg changed using sterile water, 4x4 gauze, and abd pads. Wounds on buttocks still draining bright red blood. MD notified. Pt's sister, [name], called x's2. MD notified to update sister with any new information. Pt in bed resting. Will continue to monitor.

## 2013-04-05 NOTE — Progress Notes (Signed)
[facility] PROGRESS NOTE  Xavier Khan YNW:[id number] DOB: 1956-07-10 DOA: 03/30/2013 PCP: No PCP Per Patient Brief Narrative: Xavier Khan is a 57 y.o. male with known history of hidradenitis suppuritiva, CAD status post stenting, colon cancer status post surgery last year, CVA with left-sided hemiplegia, schizophrenia, cognitive deficits, chronic anemia was found to have increasing bleeding from his groin area where patient has hidranitis suppuritiva. As per patient's sister with whom patient lives patient was being treated with multiple does of antibiotics over the last one month it to increased drainage from the groin area. Patient also has been having slow blood loss from the area. Today patient was noticed to have more than usual blood loss and patient was felt to be weak. On arrival in the ER patient was found to be tachycardic and tachypneic and it improved with fluids. On exam patient had increase drainage from the groin area with blood. On-call surgeon was consulted and at this time patient has been admitted for worsening of is hidradenitis suppurativa   Assessment/Plan: 1. Hidradenitis suppurativa, Acute on Chronic infection - CT pelvis noted -S/p 5 days of IV Vanc/Zosyn, change to PO augmentin and Doxycycline -CCS signed off, though will need debridement but pt refused this although sister is PoA and wants him to have surgery. Psychiatry consulted to evaluate competency. Pt was evaluated and assessed that pt does not meet capacity for consenting for the treatment. Surgery was called back, recommended outpatient follow up with to be discharged on oral antibiotics.  -wound care consult noted and recommendations given.   2. Anemia - with blood loss from 1 and hemodilution,  -no GI bleeding noted, Anemia panel showed elevated ferritin, low saturation ratio's and low iron levels.  - as per RN, pt had little bleeding from the hidradenitis , will give him a unit of blood  And repeat H&h  tonight.    3. History of CVA with left-sided hemiplegia - continue plavix  4. Diabetes mellitus type 2 - with hypoglycemic episodes -stopped oral hypoglycemics, SSI. CBG (last 3)   Recent Labs  04/05/13 0803 04/05/13 1129 04/05/13 1630  GLUCAP 230* 166* 193*      5. History of colon cancer status post resection last year.  6. Schizophrenia - continue cogentin, seroquel and risperdal. Psychiatry consulted appreciate recommendations.   8.    Hyperlipidemia - continue statin  9.     Gout - continue allopurinol.  Code Status: FULL Family Communication: discussed with sis ter in the room and on the phone.  Disposition Plan:SNF tomorrow  Consultants:  CCS  Antibiotics:  Vanc 5/23 to 5/27  Zosyn 5/23 to 5/27  HPI/Subjective: Sleepy, comfortable no new complaints.   Objective: Filed Vitals:   04/04/13 2024 04/05/13 0405 04/05/13 0500 04/05/13 1504  BP: 90/54 103/56  104/59  Pulse: 97 95  101  Temp: 97.9 F (36.6 C) 97 F (36.1 C)  98.3 F (36.8 C)  TempSrc: Oral Axillary  Oral  Resp:  18  16  Height:      Weight:   54.3 kg (119 lb 11.4 oz)   SpO2: 98% 98%  100%    Intake/Output Summary (Last 24 hours) at 04/05/13 1739 Last data filed at 04/05/13 1100  Gross per 24 hour  Intake 1310.84 ml  Output   1500 ml  Net -189.16 ml   Filed Weights   04/03/13 0526 04/04/13 0433 04/05/13 0500  Weight: 57.7 kg (127 lb 3.3 oz) 56.8 kg (125 lb 3.5 oz) 54.3 kg (119  lb 11.4 oz)    Exam:   General:  Alert, awake,  Oriented to self and person  Cardiovascular: S1S2/RRR  Respiratory: CTAB, diminished at bases  Abdomen: soft, NT, BS present Perineum: Thick, dark  induration of the left buttock, bilateral perineum, proximal medial thighs with small openings  Data Reviewed: Basic Metabolic Panel:  Recent Labs Lab 03/30/13 1606 03/31/13 0530 04/01/13 0340 04/03/13 1748 04/04/13 0350  NA 132* 134* 133* 129* 132*  K 3.7 3.6 4.1 4.7 4.1  CL 94* 101 101 94*  97  CO2 26 23 25 24 29   GLUCOSE 61* 47* 130* 211* 133*  BUN 6 4* 7 7 7   CREATININE 0.58 0.54 0.60 0.52 0.52  CALCIUM 9.1 8.4 8.3* 8.8 9.0   Liver Function Tests:  Recent Labs Lab 03/30/13 1606 03/31/13 0530 04/03/13 1748  AST 20 15 14   ALT 19 14 14   ALKPHOS 218* 172* 191*  BILITOT 0.5 0.5 0.4  PROT 11.4* 8.9* 9.8*  ALBUMIN 2.1* 1.6* 1.6*   No results found for this basename: LIPASE, AMYLASE,  in the last 168 hours No results found for this basename: AMMONIA,  in the last 168 hours CBC:  Recent Labs Lab 03/31/13 0530 04/01/13 0340 04/03/13 1748 04/04/13 0350 04/05/13 0850  WBC 10.8* 12.4* 12.4* 11.0* 14.4*  HGB 8.9* 7.8* 8.7* 8.4* 8.3*  HCT 27.7* 24.7* 26.5* 25.9* 24.9*  MCV 92.0 93.6 92.7 91.8 92.6  PLT 476* 468* 482* 448* 438*   Cardiac Enzymes: No results found for this basename: CKTOTAL, CKMB, CKMBINDEX, TROPONINI,  in the last 168 hours BNP (last 3 results) No results found for this basename: PROBNP,  in the last 8760 hours CBG:  Recent Labs Lab 04/05/13 0023 04/05/13 0403 04/05/13 0803 04/05/13 1129 04/05/13 1630  GLUCAP 164* 114* 230* 166* 193*    Recent Results (from the past 240 hour(s))  CULTURE, BLOOD (ROUTINE X 2)     Status: None   Collection Time    03/30/13  6:00 PM      Result Value Range Status   Specimen Description BLOOD RIGHT WRIST   Final   Special Requests BOTTLES DRAWN AEROBIC ONLY 5CC   Final   Culture  Setup Time 03/31/2013 03:03   Final   Culture     Final   Value:        BLOOD CULTURE RECEIVED NO GROWTH TO DATE CULTURE WILL BE HELD FOR 5 DAYS BEFORE ISSUING A FINAL NEGATIVE REPORT   Report Status PENDING   Incomplete  CULTURE, BLOOD (ROUTINE X 2)     Status: None   Collection Time    03/30/13  7:08 PM      Result Value Range Status   Specimen Description BLOOD HAND RIGHT   Final   Special Requests BOTTLES DRAWN AEROBIC ONLY 3CC   Final   Culture  Setup Time 03/31/2013 03:03   Final   Culture     Final   Value:         BLOOD CULTURE RECEIVED NO GROWTH TO DATE CULTURE WILL BE HELD FOR 5 DAYS BEFORE ISSUING A FINAL NEGATIVE REPORT   Report Status PENDING   Incomplete  URINE CULTURE     Status: None   Collection Time    03/30/13  8:59 PM      Result Value Range Status   Specimen Description URINE, CLEAN CATCH   Final   Special Requests NONE   Final   Culture  Setup Time 03/31/2013 01:50  Final   Colony Count 3,000 COLONIES/ML   Final   Culture INSIGNIFICANT GROWTH   Final   Report Status 04/01/2013 FINAL   Final  GRAM STAIN     Status: None   Collection Time    03/30/13  8:59 PM      Result Value Range Status   Specimen Description URINE, CLEAN CATCH   Final   Special Requests NONE   Final   Gram Stain     Final   Value: CYTOSPIN     Neg for bacteria     WBC PRESENT,BOTH PMN AND MONONUCLEAR Only 2 cells seen on slide     Results Called toRayburn Ma, RN [id number] [name]   Report Status 03/30/2013 FINAL   Final  MRSA PCR SCREENING     Status: None   Collection Time    03/31/13 12:17 AM      Result Value Range Status   MRSA by PCR NEGATIVE  NEGATIVE Final   Comment:            The GeneXpert MRSA Assay (FDA     approved for NASAL specimens     only), is one component of a     comprehensive MRSA colonization     surveillance program. It is not     intended to diagnose MRSA     infection nor to guide or     monitor treatment for     MRSA infections.     Studies: No results found.  Scheduled Meds: . allopurinol  300 mg Oral Daily  . amoxicillin-clavulanate  1 tablet Oral BID WC  . aspirin EC  81 mg Oral Daily  . atorvastatin  80 mg Oral Daily  . benztropine  1 mg Oral Daily  . clopidogrel  75 mg Oral Daily  . doxycycline  100 mg Oral Q12H  . feeding supplement  237 mL Oral BID BM  . ferrous sulfate  325 mg Oral BID  . gabapentin  600 mg Oral QHS  . insulin aspart  0-9 Units Subcutaneous TID WC  . magnesium oxide  400 mg Oral TID  . mirtazapine  15 mg Oral QHS  . multivitamin  with minerals  1 tablet Oral Daily  . polyethylene glycol  17 g Oral Daily  . QUEtiapine  200 mg Oral QHS  . risperiDONE  3 mg Oral BID  . senna-docusate  1 tablet Oral BID  . sodium chloride  3 mL Intravenous Q12H   Continuous Infusions: . sodium chloride 50 mL/hr at 04/05/13 1478    Principal Problem:   Cellulitis Active Problems:   CAD (coronary artery disease)   Hidradenitis suppurativa   Diabetes mellitus   Schizophrenia   History of CVA (cerebrovascular accident)   Anemia   Hyperlipidemia   Gout   Dementia    Time spent:    [facility]  [facility] Pager [phone]. If 7PM-7AM, please contact night-coverage at [email or web address], password [facility] 04/05/2013, 5:39 PM  LOS: 6 days

## 2013-04-05 NOTE — Clinical Social Work Placement (Addendum)
    Clinical Social Work Department CLINICAL SOCIAL WORK PLACEMENT NOTE 04/05/2013  Patient:  Xavier Khan, Xavier Khan  Account Number:  [id number] Admit date:  03/30/2013  Clinical Social Worker:  Hulan Fray  Date/time:  04/05/2013 01:40 PM  Clinical Social Work is seeking post-discharge placement for this patient at the following level of care:   SKILLED NURSING   (*CSW will update this form in Epic as items are completed)   04/05/2013  Patient/family provided with [facility] Department of Clinical Social Work's list of facilities offering this level of care within the geographic area requested by the patient (or if unable, by the patient's family).  04/05/2013  Patient/family informed of their freedom to choose among providers that offer the needed level of care, that participate in Medicare, Medicaid or managed care program needed by the patient, have an available bed and are willing to accept the patient.  04/05/2013  Patient/family informed of [facility]' ownership interest in [facility], as well as of the fact that they are under no obligation to receive care at this facility.  PASARR submitted to EDS on 04/05/2013 PASARR number received from E[employer] on 04/06/13  FL2 transmitted to all facilities in geographic area requested by pt/family on  04/05/2013 FL2 transmitted to all facilities within larger geographic area on   Patient informed that his/her managed care company has contracts with or will negotiate with  certain facilities, including the following:     Patient/family informed of bed offers received:  04-06-13 Patient chooses bed at [facility] Physician recommends and patient chooses bed at    Patient to be transferred to [facility] on   Patient to be transferred to facility by [facility] wheelchair Zenaida Niece  The following physician request were entered in Epic:   Additional Comments:

## 2013-04-05 NOTE — Progress Notes (Signed)
Up until this point the patient has been refusing surgery.  However, psych just recommended a POA given the patient is incapable of consenting for the surgery (see Dr. Valora Corporal note).  I am under the impression that the family does not have a official POA for the sister.  Because the patient is deemed incapable to make decisions, this must go through the proper channels (court system) prior to being approved.  This may take some time.  Therefore it may be better to discharge the patient on antibiotics if needed when stable and have him follow up with Dr. Corliss Skains or another surgeon who does Hidradenitis surgery in 1-2 weeks.

## 2013-04-05 NOTE — Progress Notes (Signed)
I  agree with PA-Dort's note.  Pt OK for d/c with abx, and f/u as outpt.  No emergent need to excision

## 2013-04-05 NOTE — Progress Notes (Signed)
NUTRITION FOLLOW UP  Intervention:    Continue Ensure Complete twice daily (350 kcals, 13 gm protein per 8 fl oz bottle)  Continue multivitamin with minerals daily RD to follow for nutrition care plan  Nutrition Dx:   Increased nutrient needs related to wound healing as evidenced by estimated nutrition needs, ongoing  Goal:   Oral intake with meals & supplements to meet >/= 90% of estimated nutrition needs, progressing  Monitor:   PO & supplemental intake, weight, labs, I/O's  Assessment:   Psychiatry evaluation completed 5/28 ---> patient does not meet capacity for consenting for proposed treatment/surgery.  PO intake variable at 10-75% per flowsheet records.  Family report patient's appetite is poor, however, is drinking Ensure supplements.    Height: Ht Readings from Last 1 Encounters:  03/30/13 5\' 4"  (1.626 m)    Weight Status:   Wt Readings from Last 1 Encounters:  04/05/13 119 lb 11.4 oz (54.3 kg)    Re-estimated needs:  Kcal: 1650-1800 Protein: 75-85 gm Fluid: 1.6-1.8 L  Skin: weeping, cellulitis on buttocks and groin  Diet Order: Cardiac   Intake/Output Summary (Last 24 hours) at 04/05/13 1054 Last data filed at 04/05/13 5621  Gross per 24 hour  Intake 1430.84 ml  Output   1250 ml  Net 180.84 ml    Labs:   Recent Labs Lab 04/01/13 0340 04/03/13 1748 04/04/13 0350  NA 133* 129* 132*  K 4.1 4.7 4.1  CL 101 94* 97  CO2 25 24 29   BUN 7 7 7   CREATININE 0.60 0.52 0.52  CALCIUM 8.3* 8.8 9.0  GLUCOSE 130* 211* 133*    CBG (last 3)   Recent Labs  04/05/13 0023 04/05/13 0403 04/05/13 0803  GLUCAP 164* 114* 230*    Scheduled Meds: . allopurinol  300 mg Oral Daily  . amoxicillin-clavulanate  1 tablet Oral BID WC  . aspirin EC  81 mg Oral Daily  . atorvastatin  80 mg Oral Daily  . benztropine  1 mg Oral Daily  . clopidogrel  75 mg Oral Daily  . doxycycline  100 mg Oral Q12H  . Ensure Plus  237 mL Oral BID BM  . ferrous sulfate  325 mg  Oral BID  . gabapentin  600 mg Oral QHS  . insulin aspart  0-9 Units Subcutaneous TID WC  . magnesium oxide  400 mg Oral TID  . mirtazapine  15 mg Oral QHS  . multivitamin with minerals  1 tablet Oral Daily  . polyethylene glycol  17 g Oral Daily  . QUEtiapine  200 mg Oral QHS  . risperiDONE  3 mg Oral BID  . sodium chloride  3 mL Intravenous Q12H    Continuous Infusions: . sodium chloride 50 mL/hr at 04/05/13 3086    Maureen Chatters, RD, LDN Pager #: [phone] After-Hours Pager #: [phone]

## 2013-04-05 NOTE — Progress Notes (Signed)
Pt's sister called with updates regarding pt status. Sister pleased with plan of care. Will continue to monitor.

## 2013-04-06 LAB — CULTURE, BLOOD (ROUTINE X 2): Culture: NO GROWTH

## 2013-04-06 LAB — HEMOGLOBIN AND HEMATOCRIT, BLOOD: HCT: 27.7 % — ABNORMAL LOW (ref 39.0–52.0)

## 2013-04-06 LAB — TYPE AND SCREEN
Antibody Screen: NEGATIVE
Unit division: 0

## 2013-04-06 LAB — GLUCOSE, CAPILLARY
Glucose-Capillary: 219 mg/dL — ABNORMAL HIGH (ref 70–99)
Glucose-Capillary: 205 mg/dL — ABNORMAL HIGH (ref 70–99)
Glucose-Capillary: 243 mg/dL — ABNORMAL HIGH (ref 70–99)
Glucose-Capillary: 119 mg/dL — ABNORMAL HIGH (ref 70–99)

## 2013-04-06 NOTE — Progress Notes (Signed)
[facility] PROGRESS NOTE  Xavier Khan UJW:[id number] DOB: 1955/12/28 DOA: 03/30/2013 PCP: No PCP Per Patient Brief Narrative: Xavier Khan is a 57 y.o. male with known history of hidradenitis suppuritiva, CAD status post stenting, colon cancer status post surgery last year, CVA with left-sided hemiplegia, schizophrenia, cognitive deficits, chronic anemia was found to have increasing bleeding from his groin area where patient has hidranitis suppuritiva. As per patient's sister with whom patient lives patient was being treated with multiple does of antibiotics over the last one month it to increased drainage from the groin area. Patient also has been having slow blood loss from the area. Today patient was noticed to have more than usual blood loss and patient was felt to be weak. On arrival in the ER patient was found to be tachycardic and tachypneic and it improved with fluids. On exam patient had increase drainage from the groin area with blood. On-call surgeon was consulted and at this time patient has been admitted for worsening of is hidradenitis suppurativa   Assessment/Plan: 1. Hidradenitis suppurativa, Acute on Chronic infection - CT pelvis noted -S/p 5 days of IV Vanc/Zosyn, change to PO augmentin and Doxycycline on 5/28, we will continue with oral antibiotics.  -- plan for I&D tomorrow.  -wound care consult noted and recommendations given.   2. Anemia - with blood loss from 1 and hemodilution,  -no GI bleeding noted, Anemia panel showed elevated ferritin, low saturation ratio's and low iron levels.  - as per RN, pt had little bleeding from the hidradenitis , he received one unit of prbc transfusion and his H&h improved to 9.    3. History of CVA with left-sided hemiplegia - continue plavix.   4. Diabetes mellitus type 2 - with hypoglycemic episodes -stopped oral hypoglycemics, SSI. He is no lonnger hypoglcemic episodes.  CBG (last 3)   Recent Labs  04/06/13 0420  04/06/13 0743 04/06/13 1124  GLUCAP 192* 219* 205*      5. History of colon cancer status post resection last year.  6. Schizophrenia - continue cogentin, seroquel and risperdal. Psychiatry consulted appreciate recommendations.   8.    Hyperlipidemia - continue statin  9.     Gout - continue allopurinol.  Code Status: FULL Family Communication: discussed with sis ter in the room and on the phone on 5/29th.  Disposition Plan: I&D tomorrow andpossibly to snf on Monday.   Consultants:  CCS  Antibiotics:  Vanc 5/23 to 5/27  Zosyn 5/23 to 5/27  augmentin  Doxycycline.  HPI/Subjective: Sleepy, comfortable no new complaints.   Objective: Filed Vitals:   04/06/13 0140 04/06/13 0200 04/06/13 0220 04/06/13 0401  BP: 115/71 118/74 117/72 106/65  Pulse: 102 101 100 97  Temp: 97.2 F (36.2 C) 97.4 F (36.3 C) 97.6 F (36.4 C) 97.5 F (36.4 C)  TempSrc: Oral Oral Oral Oral  Resp: 19 18 18 18   Height:      Weight:    54.5 kg (120 lb 2.4 oz)  SpO2:    100%    Intake/Output Summary (Last 24 hours) at 04/06/13 1355 Last data filed at 04/06/13 0900  Gross per 24 hour  Intake 2533.33 ml  Output   2100 ml  Net 433.33 ml   Filed Weights   04/04/13 0433 04/05/13 0500 04/06/13 0401  Weight: 56.8 kg (125 lb 3.5 oz) 54.3 kg (119 lb 11.4 oz) 54.5 kg (120 lb 2.4 oz)    Exam:   General:  Alert, awake,  Oriented to self and  person  Cardiovascular: S1S2/RRR  Respiratory: CTAB, diminished at bases  Abdomen: soft, NT, BS present Perineum: Thick, dark  induration of the left buttock, bilateral perineum, proximal medial thighs with small openings  Data Reviewed: Basic Metabolic Panel:  Recent Labs Lab 03/30/13 1606 03/31/13 0530 04/01/13 0340 04/03/13 1748 04/04/13 0350  NA 132* 134* 133* 129* 132*  K 3.7 3.6 4.1 4.7 4.1  CL 94* 101 101 94* 97  CO2 26 23 25 24 29   GLUCOSE 61* 47* 130* 211* 133*  BUN 6 4* 7 7 7   CREATININE 0.58 0.54 0.60 0.52 0.52  CALCIUM  9.1 8.4 8.3* 8.8 9.0   Liver Function Tests:  Recent Labs Lab 03/30/13 1606 03/31/13 0530 04/03/13 1748  AST 20 15 14   ALT 19 14 14   ALKPHOS 218* 172* 191*  BILITOT 0.5 0.5 0.4  PROT 11.4* 8.9* 9.8*  ALBUMIN 2.1* 1.6* 1.6*   No results found for this basename: LIPASE, AMYLASE,  in the last 168 hours No results found for this basename: AMMONIA,  in the last 168 hours CBC:  Recent Labs Lab 03/31/13 0530 04/01/13 0340 04/03/13 1748 04/04/13 0350 04/05/13 0850 04/05/13 2056 04/06/13 0445  WBC 10.8* 12.4* 12.4* 11.0* 14.4*  --   --   HGB 8.9* 7.8* 8.7* 8.4* 8.3* 8.2* 9.2*  HCT 27.7* 24.7* 26.5* 25.9* 24.9* 24.9* 27.7*  MCV 92.0 93.6 92.7 91.8 92.6  --   --   PLT 476* 468* 482* 448* 438*  --   --    Cardiac Enzymes: No results found for this basename: CKTOTAL, CKMB, CKMBINDEX, TROPONINI,  in the last 168 hours BNP (last 3 results) No results found for this basename: PROBNP,  in the last 8760 hours CBG:  Recent Labs Lab 04/05/13 2006 04/06/13 0031 04/06/13 0420 04/06/13 0743 04/06/13 1124  GLUCAP 264* 254* 192* 219* 205*    Recent Results (from the past 240 hour(s))  CULTURE, BLOOD (ROUTINE X 2)     Status: None   Collection Time    03/30/13  6:00 PM      Result Value Range Status   Specimen Description BLOOD RIGHT WRIST   Final   Special Requests BOTTLES DRAWN AEROBIC ONLY 5CC   Final   Culture  Setup Time 03/31/2013 03:03   Final   Culture NO GROWTH 5 DAYS   Final   Report Status 04/06/2013 FINAL   Final  CULTURE, BLOOD (ROUTINE X 2)     Status: None   Collection Time    03/30/13  7:08 PM      Result Value Range Status   Specimen Description BLOOD HAND RIGHT   Final   Special Requests BOTTLES DRAWN AEROBIC ONLY 3CC   Final   Culture  Setup Time 03/31/2013 03:03   Final   Culture NO GROWTH 5 DAYS   Final   Report Status 04/06/2013 FINAL   Final  URINE CULTURE     Status: None   Collection Time    03/30/13  8:59 PM      Result Value Range Status    Specimen Description URINE, CLEAN CATCH   Final   Special Requests NONE   Final   Culture  Setup Time 03/31/2013 01:50   Final   Colony Count 3,000 COLONIES/ML   Final   Culture INSIGNIFICANT GROWTH   Final   Report Status 04/01/2013 FINAL   Final  GRAM STAIN     Status: None   Collection Time    03/30/13  8:59 PM      Result Value Range Status   Specimen Description URINE, CLEAN CATCH   Final   Special Requests NONE   Final   Gram Stain     Final   Value: CYTOSPIN     Neg for bacteria     WBC PRESENT,BOTH PMN AND MONONUCLEAR Only 2 cells seen on slide     Results Called toRayburn Ma, RN [name]   Report Status 03/30/2013 FINAL   Final  MRSA PCR SCREENING     Status: None   Collection Time    03/31/13 12:17 AM      Result Value Range Status   MRSA by PCR NEGATIVE  NEGATIVE Final   Comment:            The GeneXpert MRSA Assay (FDA     approved for NASAL specimens     only), is one component of a     comprehensive MRSA colonization     surveillance program. It is not     intended to diagnose MRSA     infection nor to guide or     monitor treatment for     MRSA infections.     Studies: No results found.  Scheduled Meds: . allopurinol  300 mg Oral Daily  . amoxicillin-clavulanate  1 tablet Oral BID WC  . aspirin EC  81 mg Oral Daily  . atorvastatin  80 mg Oral Daily  . benztropine  1 mg Oral Daily  . clopidogrel  75 mg Oral Daily  . doxycycline  100 mg Oral Q12H  . feeding supplement  237 mL Oral BID BM  . ferrous sulfate  325 mg Oral BID  . gabapentin  600 mg Oral QHS  . insulin aspart  0-9 Units Subcutaneous TID WC  . magnesium oxide  400 mg Oral TID  . mirtazapine  15 mg Oral QHS  . multivitamin with minerals  1 tablet Oral Daily  . polyethylene glycol  17 g Oral Daily  . QUEtiapine  200 mg Oral QHS  . risperiDONE  3 mg Oral BID  . senna-docusate  1 tablet Oral BID  . sodium chloride  3 mL Intravenous Q12H   Continuous Infusions: . sodium  chloride 50 mL/hr at 04/05/13 2049    Principal Problem:   Cellulitis Active Problems:   CAD (coronary artery disease)   Hidradenitis suppurativa   Diabetes mellitus   Schizophrenia   History of CVA (cerebrovascular accident)   Anemia   Hyperlipidemia   Gout   Dementia    Time spent:    [facility]  [facility] Pager [phone]. If 7PM-7AM, please contact night-coverage at [email or web address], password [facility] 04/06/2013, 1:55 PM  LOS: 7 days

## 2013-04-06 NOTE — Clinical Social Work Psych Note (Signed)
Clinical Social Work Department CLINICAL SOCIAL WORK PSYCHIATRY SERVICE LINE ASSESSMENT 04/06/2013  Patient:  Xavier Khan  Account:  [id number]  Admit Date:  03/30/2013  Clinical Social Worker:  Read Drivers  Date/Time:  04/06/2013 01:58 PM Referred by:  Physician  Date referred:  04/06/2013 Reason for Referral  Competency/Guardianship   Presenting Symptoms/Problems (In the person's/family's own words):   Psych was consulted for capacity   Abuse/Neglect/Trauma History (check all that apply)  Denies history   Abuse/Neglect/Trauma Comments:   none   Psychiatric History (check all that apply)  Denies history   Psychiatric medications:  QUEtiapine (SEROQUEL) tablet 200 mg, 200 mg, Oral, QHS, Eduard Clos, MD, 200 mg at 04/05/13 2144; risperiDONE (RISPERDAL) tablet 3 mg, 3 mg, Oral, BID, Eduard Clos, MD, 3 mg at 04/06/13 1026; senna-docusate (Senokot-S) tablet 1 tablet, 1 tablet, Oral, BID, Kathlen Mody, MD, 1 tablet at 04/06/13 1026;   Current Mental Health Hospitalizations/Previous Mental Health History:   unable to accurately assess   Current provider:   unable to accurately assess   Place and Date:   unable to accurately assess   Current Medications:   Current facility-administered medications:0.9 %  sodium chloride infusion, , Intravenous, Continuous, Zannie Cove, MD, Last Rate: 50 mL/hr at 04/05/13 2049; acetaminophen (TYLENOL) tablet 650 mg, 650 mg, Oral, Q6H PRN, Eduard Clos, MD;  allopurinol (ZYLOPRIM) tablet 300 mg, 300 mg, Oral, Daily, Eduard Clos, MD, 300 mg at 04/06/13 1026  amoxicillin-clavulanate (AUGMENTIN) 500-125 MG per tablet 500 mg, 1 tablet, Oral, BID WC, Zannie Cove, MD, 500 mg at 04/06/13 0631;  aspirin EC tablet 81 mg, 81 mg, Oral, Daily, Eduard Clos, MD, 81 mg at 04/06/13 1026; atorvastatin (LIPITOR) tablet 80 mg, 80 mg, Oral, Daily, Eduard Clos, MD, 80 mg at 04/06/13 1026; benztropine (COGENTIN)  tablet 1 mg, 1 mg, Oral, Daily, Eduard Clos, MD, 1 mg at 04/06/13 1026  bisacodyl (DULCOLAX) suppository 10 mg, 10 mg, Rectal, Daily PRN, Kathlen Mody, MD;  camphor-menthol (SARNA) lotion 1 application, 1 application, Topical, Daily PRN, Eduard Clos, MD;  clindamycin (CLEOCIN T) 1 % external solution 1 application, 1 application, Topical, BID PRN, Eduard Clos, MD;  clopidogrel (PLAVIX) tablet 75 mg, 75 mg, Oral, Daily, Eduard Clos, MD, 75 mg at 04/06/13 1026  dextrose (GLUTOSE) 40 % oral gel 37.5 g, 1 Tube, Oral, PRN, Zannie Cove, MD;  doxycycline (VIBRA-TABS) tablet 100 mg, 100 mg, Oral, Q12H, Zannie Cove, MD, 100 mg at 04/06/13 1026;  feeding supplement (ENSURE COMPLETE) liquid 237 mL, 237 mL, Oral, BID BM, Ailene Ards, RD, 237 mL at 04/06/13 1400;  ferrous sulfate tablet 325 mg, 325 mg, Oral, BID, Eduard Clos, MD, 325 mg at 04/06/13 1026  gabapentin (NEURONTIN) capsule 600 mg, 600 mg, Oral, QHS, Eduard Clos, MD, 600 mg at 04/05/13 2144;  insulin aspart (novoLOG) injection 0-9 Units, 0-9 Units, Subcutaneous, TID WC, Eduard Clos, MD, 3 Units at 04/06/13 1147;  magnesium oxide (MAG-OX) tablet 400 mg, 400 mg, Oral, TID, Eduard Clos, MD, 400 mg at 04/06/13 1026;  mirtazapine (REMERON) tablet 15 mg, 15 mg, Oral, QHS, Eduard Clos, MD, 15 mg at 04/05/13 2144  multivitamin with minerals tablet 1 tablet, 1 tablet, Oral, Daily, Lorraine Lax, RD, 1 tablet at 04/06/13 1026; ondansetron (ZOFRAN) injection 4 mg, 4 mg, Intravenous, Q6H PRN, Eduard Clos, MD;  ondansetron (ZOFRAN) tablet 4 mg, 4 mg, Oral, Q6H PRN, Eduard Clos,  MD; polyethylene glycol (MIRALAX / GLYCOLAX) packet 17 g, 17 g, Oral, Daily, Eduard Clos, MD, 17 g at 04/06/13 1026  QUEtiapine (SEROQUEL) tablet 200 mg, 200 mg, Oral, QHS, Eduard Clos, MD, 200 mg at 04/05/13 2144; risperiDONE (RISPERDAL) tablet 3 mg, 3 mg, Oral, BID, Eduard Clos, MD, 3 mg at 04/06/13 1026; senna-docusate (Senokot-S) tablet 1 tablet, 1 tablet, Oral, BID, Kathlen Mody, MD, 1 tablet at 04/06/13 1026;  sodium chloride 0.9 % injection 3 mL, 3 mL, Intravenous, Q12H, Eduard Clos, MD, 3 mL at 04/03/13 1000  triamcinolone cream (KENALOG) 0.1 % 1 application, 1 application, Topical, BID PRN, Eduard Clos, MD, 1 application at 04/02/13 1007   Previous Impatient Admission/Date/Reason:   none previously charted   Emotional Health / Current Symptoms    Suicide/Self Harm  None reported   Suicide attempt in the past:   none reported or charted   Other harmful behavior:   none reported or charted   Psychotic/Dissociative Symptoms  Confusion  Unable to accurately assess   Other Psychotic/Dissociative Symptoms:   none reported or noted    Attention/Behavioral Symptoms  Unable to accurately assess   Other Attention / Behavioral Symptoms:   none reported or charted    Cognitive Impairment  Orientation - Self  Unable to accurately assess  Recent Memory Impairment  Poor/Impaired Decision-Making  Poor Judgement   Other Cognitive Impairment:   none reported or charted    Mood and Adjustment  Mood Congruent    Stress, Anxiety, Trauma, Any Recent Loss/Stressor  None reported   Anxiety (frequency):   none reported or charted   Phobia (specify):   none reported or charted   Compulsive behavior (specify):   none reported or charted   Obsessive behavior (specify):   none reported or charted   Other:   none reported or charted   Substance Abuse/Use  None   SBIRT completed (please refer for detailed history):  N  Self-reported substance use:   none reported or charted   Urinary Drug Screen Completed:  N Alcohol level:   untested     Who is in the home:   unable to accurately assess   Emergency contact:  sister, [name] [phone]   Financial  Medicare   Patient's Strengths and Goals (patient's own  words):   Pt has supportive family who is willing to make healthcare decisions for pt.  Pt has insurance is able to go to SNF upon d/c.   Clinical Social Worker's Interpretive Summary:   CSW reviewed chart and pt with Psychiatrist.  Psychiatry was consulted for capacity.  It has been determined in Psychiatrist, Dr. Valora Corporal documentation on 04/04/2013 11:27AM that "Patient does not meet capacity for consenting for proposed treatment / surgery by his primary medical team as he can not understand the whole health condition, risk and benefits of treatment and no treatment".  No further psych needs identified.   Disposition:  Psych Clinical Social Worker signing off  Vickii Penna, [place] [phone]  Clinical Social Work

## 2013-04-06 NOTE — Progress Notes (Signed)
Physical Therapy Treatment Patient Details Name: Xavier Khan MRN: [id number] DOB: 12-04-1955 Today's Date: 04/06/2013 Time: 4098-1191 PT Time Calculation (min): 15 min  PT Assessment / Plan / Recommendation Comments on Treatment Session  Pt was  not conversive today. Pt agreeable to get  up and walk.  Progress limited by pt bleeeding.      Follow Up Recommendations  SNF;Supervision/Assistance - 24 hour                 Equipment Recommendations   (TBA)        Frequency Min 3X/week   Plan Discharge plan remains appropriate;Frequency remains appropriate    Precautions / Restrictions Precautions Precautions: Fall Precaution Comments: contact isolation Restrictions Weight Bearing Restrictions: No   Pertinent Vitals/Pain VSS, No pain    Mobility  Bed Mobility Bed Mobility: Supine to Sit;Sitting - Scoot to [place] of Bed Rolling Left: 2: Max assist Left Sidelying to Sit: 1: +2 Total assist Left Sidelying to Sit: Patient Percentage: 30% Supine to Sit: 1: +2 Total assist;HOB elevated Supine to Sit: Patient Percentage: 30% Sitting - Scoot to Edge of Bed: 1: +2 Total assist Sitting - Scoot to Edge of Bed: Patient Percentage: 30% Details for Bed Mobility Assistance: assist to bring legs off bed and lift trunk Transfers Transfers: Sit to Stand;Stand to Sit Sit to Stand: 1: +2 Total assist;From bed Sit to Stand: Patient Percentage: 40% Stand to Sit: 1: +2 Total assist;Without upper extremity assist;To chair/3-in-1 Stand to Sit: Patient Percentage: 40% Details for Transfer Assistance: assist for anterior weight shift and to sit to flex at hips and knees Ambulation/Gait Ambulation/Gait Assistance: 1: +2 Total assist Ambulation/Gait: Patient Percentage: 50% Ambulation Distance (Feet): 7 Feet Assistive device: 2 person hand held assist Ambulation/Gait Assistance Details: Ambulated toward door and noted that blood was dripping from groin/ perineal area.  Went ahead and let pt sit  down.  Appartently this happens per NT.  Cleaned pt and floor.  Needed asssit for facilitation for anterior and lateral weight shift.  Maintains hips and knees flexed.   Gait Pattern: Wide base of support;Trunk flexed;Decreased hip/knee flexion - right;Decreased hip/knee flexion - left;Shuffle;Decreased stride length;Step-through pattern;Decreased trunk rotation Gait velocity: decreased Stairs: No Wheelchair Mobility Wheelchair Mobility: No     PT Goals Acute Rehab PT Goals Pt will Roll Supine to Left Side: with min assist PT Goal: Rolling Supine to Left Side - Progress: Progressing toward goal Pt will go Supine/Side to Sit: with min assist PT Goal: Supine/Side to Sit - Progress: Progressing toward goal Pt will go Sit to Stand: with min assist PT Goal: Sit to Stand - Progress: Progressing toward goal Pt will Transfer Bed to Chair/Chair to Bed: with min assist PT Transfer Goal: Bed to Chair/Chair to Bed - Progress: Progressing toward goal Pt will Ambulate: 16 - 50 feet;with least restrictive assistive device;with min assist PT Goal: Ambulate - Progress: Progressing toward goal  Visit Information  Last PT Received On: 04/06/13 Assistance Needed: +2    Subjective Data  Subjective: "Ill get up."   Cognition  Cognition Arousal/Alertness: Awake/alert Behavior During Therapy: Flat affect Overall Cognitive Status: Impaired/Different from baseline Area of Impairment: Orientation;Safety/judgement;Problem solving Orientation Level: Disoriented to;Situation Safety/Judgement: Decreased awareness of deficits Problem Solving: Slow processing;Decreased initiation;Requires tactile cues;Requires verbal cues;Difficulty sequencing    Balance  Static Sitting Balance Static Sitting - Balance Support: Feet supported;Right upper extremity supported Static Sitting - Level of Assistance: 4: Min assist Static Sitting - Comment/# of Minutes: 2 minutes with min  assist for balance.  Static Standing  Balance Static Standing - Balance Support: Right upper extremity supported;Left upper extremity supported Static Standing - Level of Assistance: 1: +2 Total assist;Patient percentage (comment) (50%)  End of Session PT - End of Session Equipment Utilized During Treatment: Gait belt Activity Tolerance: Patient limited by fatigue Patient left: in chair;with call bell/phone within reach Nurse Communication: Mobility status;Need for lift equipment       INGOLD,Ariyanna Oien 04/06/2013, 1:09 PM  [facility] [phone] (pager)

## 2013-04-06 NOTE — Clinical Social Work Note (Signed)
Clinical Social Worker updated sister on SNF bed availability and she confirmed [facility]. CSW reviewed chart and noticed anticipated discharge for Monday. CSW confirmed and updated the facility and they are agreeable to received Monday if medically stable. CSW updated Linsey, [facility] SW. CSW is continuing to follow.   Rozetta Nunnery MSW, [employer] [phone]

## 2013-04-06 NOTE — Progress Notes (Signed)
Dressing changed on from pt. Groin with minimal old serous fluid. Changed dressing on bloody buttocks dressing.   Xavier Edu, RN

## 2013-04-06 NOTE — Progress Notes (Signed)
Changed groin dressing. Serous fluid on old dressing. MD changed bloody soiled dressing on buttocks.

## 2013-04-06 NOTE — Progress Notes (Signed)
Occupational Therapy Treatment Patient Details Name: Xavier Khan MRN: [id number] DOB: 06-Aug-1956 Today's Date: 04/06/2013 Time: 7829-5621 OT Time Calculation (min): 25 min  OT Assessment / Plan / Recommendation Comments on Treatment Session Pt is a 57 yr old male with history of left hemiparesis and dependency for selfcare tasks. Admitted with Hidradenitis suppurativa.  Now needs total assist to total assist +2 for all selfcare tasks and functional transfers.  Feel he will need extensive SNF level rehab based on current level of assistance needed and help available at home.     Follow Up Recommendations  SNF       Equipment Recommendations  3 in 1 bedside comode;Wheelchair (measurements OT);Wheelchair cushion (measurements OT)       Frequency Min 2X/week   Plan Discharge plan remains appropriate    Precautions / Restrictions Precautions Precautions: Fall Precaution Comments: contact isolation, history of left hemiparesis Restrictions Weight Bearing Restrictions: No   Pertinent Vitals/Pain No report of pain    ADL  Lower Body Dressing: Performed;+1 Total assistance Where Assessed - Lower Body Dressing: Supported sitting (pt donned gripper socks) Toilet Transfer: Simulated;+1 Total assistance Toilet Transfer Method: Stand pivot Toilet Transfer Equipment: Other (comment) (to EOB, pt with condom cathetor) Transfers/Ambulation Related to ADLs: Pt required total assist for stand pivot transfer to bed from bedside chair. ADL Comments: Pt with decreased ability to perform any mobility for scooting EOC.  Maintains stiff head, neck, and trunk.  Only able to elicit minimal LUE elbow extension but overall not functionally able to use the UE to assist with any aspects of selfcare or transfers.  Did help to initiate sit to stand but demonstrates decreased efficiency with weightshifting side to side for adequate stepping.        OT Goals ADL Goals Pt Will Perform Eating: Sitting, chair;with  min assist Pt Will Perform Grooming: with min assist;Sitting, edge of bed Pt Will Transfer to Toilet: with min assist;Ambulation;with DME Miscellaneous OT Goals Miscellaneous OT Goal #1: Pt will perform bed mobility with mod assist to attain EOB for ADL. OT Goal: Miscellaneous Goal #1 - Progress: Progressing toward goals Miscellaneous OT Goal #2: Pt will follow simple one step instructions with min physical and verbal cues 75% of time. OT Goal: Miscellaneous Goal #2 - Progress: Progressing toward goals  Visit Information  Last OT Received On: 04/06/13 Assistance Needed: +2    Subjective Data  Subjective: I have a friend that helps me. Patient Stated Goal: Did not state this session.      Cognition  Cognition Arousal/Alertness: Awake/alert Behavior During Therapy: Flat affect Overall Cognitive Status: Impaired/Different from baseline Area of Impairment: Following commands;Problem solving;Attention Orientation Level: Disoriented to;Situation Following Commands: Follows one step commands with increased time Safety/Judgement: Decreased awareness of deficits Problem Solving: Slow processing;Decreased initiation;Requires verbal cues    Mobility  Bed Mobility Bed Mobility: Sit to Supine Rolling Left: 2: Max assist Left Sidelying to Sit: 1: +2 Total assist Left Sidelying to Sit: Patient Percentage: 30% Supine to Sit: 1: +2 Total assist;HOB elevated Supine to Sit: Patient Percentage: 30% Sitting - Scoot to Edge of Bed: 1: +2 Total assist Sitting - Scoot to Edge of Bed: Patient Percentage: 30% Sit to Supine: 1: +1 Total assist;HOB flat Details for Bed Mobility Assistance: Pt needs assistance with lifting LEs onto the bed.  Also resistant to bringing trunk down to EOB. Transfers Transfers: Sit to Stand;Stand to Sit Sit to Stand: 1: +1 Total assist Sit to Stand: Patient Percentage: 40% Stand to Sit:  1: +1 Total assist Stand to Sit: Patient Percentage: 40% Details for Transfer  Assistance: assist for anterior weight shift and to sit to flex at hips and knees       Balance Static Sitting Balance Static Sitting - Balance Support: Feet supported;Right upper extremity supported Static Sitting - Level of Assistance: 4: Min assist Static Sitting - Comment/# of Minutes: 2 minutes with min assist for balance.  Static Standing Balance Static Standing - Balance Support: Right upper extremity supported;Left upper extremity supported Static Standing - Level of Assistance: 1: +2 Total assist;Patient percentage (comment) (50%)   End of Session OT - End of Session Activity Tolerance: Patient tolerated treatment well Patient left: in chair;with call bell/phone within reach Nurse Communication: Mobility status     Falon Huesca OTR/L Pager number [phone] 04/06/2013, 2:37 PM

## 2013-04-06 NOTE — Progress Notes (Signed)
Patient ID: Xavier Khan, male   DOB: October 23, 1956, 57 y.o.   MRN: [id number]    Subjective: No acute changes, denies significant pain  Objective: Vital signs in last 24 hours: Temp:  [97 F (36.1 C)-98.3 F (36.8 C)] 97.5 F (36.4 C) (05/30 0401) Pulse Rate:  [97-103] 97 (05/30 0401) Resp:  [16-20] 18 (05/30 0401) BP: (104-125)/(59-74) 106/65 mmHg (05/30 0401) SpO2:  [98 %-100 %] 100 % (05/30 0401) Weight:  [120 lb 2.4 oz (54.5 kg)] 120 lb 2.4 oz (54.5 kg) (05/30 0401) Last BM Date: 03/30/13  Intake/Output from previous day: 05/29 0701 - 05/30 0700 In: 2533.3 [P.O.:960; I.V.:1210.8; Blood:362.5] Out: 2750 [Urine:2750] Intake/Output this shift:    PE: Ext: left groin with purulent drainage and hard indurated area, tender to touch, skin changes General: NAD, somewhat agitated after I started examining his groin  Lab Results:   Recent Labs  04/04/13 0350 04/05/13 0850 04/05/13 2056 04/06/13 0445  WBC 11.0* 14.4*  --   --   HGB 8.4* 8.3* 8.2* 9.2*  HCT 25.9* 24.9* 24.9* 27.7*  PLT 448* 438*  --   --    BMET  Recent Labs  04/03/13 1748 04/04/13 0350  NA 129* 132*  K 4.7 4.1  CL 94* 97  CO2 24 29  GLUCOSE 211* 133*  BUN 7 7  CREATININE 0.52 0.52  CALCIUM 8.8 9.0   PT/INR No results found for this basename: LABPROT, INR,  in the last 72 hours CMP     Component Value Date/Time   NA 132* 04/04/2013 0350   K 4.1 04/04/2013 0350   CL 97 04/04/2013 0350   CO2 29 04/04/2013 0350   GLUCOSE 133* 04/04/2013 0350   BUN 7 04/04/2013 0350   CREATININE 0.52 04/04/2013 0350   CALCIUM 9.0 04/04/2013 0350   PROT 9.8* 04/03/2013 1748   ALBUMIN 1.6* 04/03/2013 1748   AST 14 04/03/2013 1748   ALT 14 04/03/2013 1748   ALKPHOS 191* 04/03/2013 1748   BILITOT 0.4 04/03/2013 1748   GFRNONAA >90 04/04/2013 0350   GFRAA >90 04/04/2013 0350   Lipase  No results found for this basename: lipase       Studies/Results: No results found.  Anti-infectives: Anti-infectives   Start      Dose/Rate Route Frequency Ordered Stop   04/04/13 0730  amoxicillin-clavulanate (AUGMENTIN) 500-125 MG per tablet 500 mg     1 tablet Oral 2 times daily with meals 04/03/13 1727     04/03/13 2200  doxycycline (VIBRA-TABS) tablet 100 mg     100 mg Oral Every 12 hours 04/03/13 1633     04/03/13 1800  amoxicillin-clavulanate (AUGMENTIN) 500-125 MG per tablet 500 mg  Status:  Discontinued     1 tablet Oral 2 times daily with meals 04/03/13 1633 04/03/13 1726   04/03/13 1200  amoxicillin-clavulanate (AUGMENTIN) 500-125 MG per tablet 500 mg  Status:  Discontinued     1 tablet Oral 2 times daily with meals 04/03/13 1726 04/03/13 1727   04/01/13 2100  vancomycin (VANCOCIN) IVPB 750 mg/150 ml premix  Status:  Discontinued     750 mg 150 mL/hr over 60 Minutes Intravenous Every 12 hours 04/01/13 2052 04/03/13 1633   03/31/13 0800  vancomycin (VANCOCIN) IVPB 1000 mg/200 mL premix  Status:  Discontinued     1,000 mg 200 mL/hr over 60 Minutes Intravenous Every 12 hours 03/30/13 1835 04/01/13 2052   03/30/13 2200  piperacillin-tazobactam (ZOSYN) IVPB 3.375 g  Status:  Discontinued  3.375 g 12.5 mL/hr over 240 Minutes Intravenous 3 times per day 03/30/13 1835 04/03/13 1633   03/30/13 1730  piperacillin-tazobactam (ZOSYN) IVPB 3.375 g     3.375 g 100 mL/hr over 30 Minutes Intravenous  Once 03/30/13 1724 03/30/13 2106   03/30/13 1730  vancomycin (VANCOCIN) IVPB 1000 mg/200 mL premix     1,000 mg 200 mL/hr over 60 Minutes Intravenous  Once 03/30/13 1724 03/30/13 2206       Assessment/Plan Hidradenitis Left Groin:  Will need abx Will attempt drainage if needed and excision once infection stabilized   LOS: 7 days    WHITE, ELIZABETH 04/06/2013

## 2013-04-07 ENCOUNTER — Encounter (HOSPITAL_COMMUNITY): Payer: Self-pay | Admitting: Anesthesiology

## 2013-04-07 ENCOUNTER — Encounter (HOSPITAL_COMMUNITY)
Admission: EM | Disposition: A | Discharge status: Skilled Nursing Facility | Payer: Self-pay | Source: Home / Self Care | Attending: Internal Medicine

## 2013-04-07 ENCOUNTER — Inpatient Hospital Stay (HOSPITAL_COMMUNITY): Payer: Medicare (Managed Care) | Admitting: Anesthesiology

## 2013-04-07 DIAGNOSIS — L02219 Cutaneous abscess of trunk, unspecified: Secondary | ICD-10-CM

## 2013-04-07 DIAGNOSIS — L03319 Cellulitis of trunk, unspecified: Secondary | ICD-10-CM

## 2013-04-07 HISTORY — PX: WOUND DEBRIDEMENT: SHX247

## 2013-04-07 HISTORY — PX: IRRIGATION AND DEBRIDEMENT ABSCESS: SHX5252

## 2013-04-07 LAB — GLUCOSE, CAPILLARY
Glucose-Capillary: 163 mg/dL — ABNORMAL HIGH (ref 70–99)
Glucose-Capillary: 172 mg/dL — ABNORMAL HIGH (ref 70–99)

## 2013-04-07 SURGERY — DEBRIDEMENT, WOUND
Anesthesia: General | Laterality: Left | Wound class: Dirty or Infected

## 2013-04-07 MED ORDER — FLEET ENEMA 7-19 GM/118ML RE ENEM
1.0000 | ENEMA | Freq: Once | RECTAL | Status: DC
  Filled 2013-04-07: qty 1

## 2013-04-07 MED ORDER — HYDROMORPHONE HCL PF 1 MG/ML IJ SOLN
INTRAMUSCULAR | Status: AC
  Filled 2013-04-07: qty 1

## 2013-04-07 MED ORDER — DEXTROSE 5 % IV SOLN
10.0000 mg | INTRAVENOUS | Status: DC | PRN
  Administered 2013-04-07: 50 ug/min via INTRAVENOUS

## 2013-04-07 MED ORDER — ONDANSETRON HCL 4 MG/2ML IJ SOLN
INTRAMUSCULAR | Status: DC | PRN
  Administered 2013-04-07: 4 mg via INTRAVENOUS

## 2013-04-07 MED ORDER — LACTATED RINGERS IV SOLN
INTRAVENOUS | Status: DC | PRN
  Administered 2013-04-07: 10:00:00 via INTRAVENOUS

## 2013-04-07 MED ORDER — LIDOCAINE HCL (CARDIAC) 20 MG/ML IV SOLN
INTRAVENOUS | Status: DC | PRN
  Administered 2013-04-07: 100 mg via INTRAVENOUS

## 2013-04-07 MED ORDER — FLEET ENEMA 7-19 GM/118ML RE ENEM
1.0000 | ENEMA | Freq: Once | RECTAL | Status: AC
  Administered 2013-04-07: 1 via RECTAL
  Filled 2013-04-07 (×2): qty 1

## 2013-04-07 MED ORDER — OXYCODONE HCL 5 MG/5ML PO SOLN: 5.0000 mg | Freq: Once | ORAL | Status: AC | PRN

## 2013-04-07 MED ORDER — PHENYLEPHRINE HCL 10 MG/ML IJ SOLN
INTRAMUSCULAR | Status: DC | PRN
  Administered 2013-04-07 (×5): 80 ug via INTRAVENOUS

## 2013-04-07 MED ORDER — PROPOFOL 10 MG/ML IV BOLUS
INTRAVENOUS | Status: DC | PRN
  Administered 2013-04-07: 150 mg via INTRAVENOUS

## 2013-04-07 MED ORDER — OXYCODONE HCL 5 MG PO TABS
ORAL_TABLET | ORAL | Status: AC
  Filled 2013-04-07: qty 1

## 2013-04-07 MED ORDER — OXYCODONE HCL 5 MG PO TABS
5.0000 mg | ORAL_TABLET | Freq: Once | ORAL | Status: AC | PRN
  Administered 2013-04-07: 5 mg via ORAL

## 2013-04-07 MED ORDER — ARTIFICIAL TEARS OP OINT
TOPICAL_OINTMENT | OPHTHALMIC | Status: DC | PRN
  Administered 2013-04-07: 1 via OPHTHALMIC

## 2013-04-07 MED ORDER — HYDROMORPHONE HCL PF 1 MG/ML IJ SOLN
0.2500 mg | INTRAMUSCULAR | Status: DC | PRN
  Administered 2013-04-07 (×2): 0.5 mg via INTRAVENOUS

## 2013-04-07 MED ORDER — 0.9 % SODIUM CHLORIDE (POUR BTL) OPTIME
TOPICAL | Status: DC | PRN
  Administered 2013-04-07: 1000 mL

## 2013-04-07 MED ORDER — PROMETHAZINE HCL 25 MG/ML IJ SOLN: 6.2500 mg | INTRAMUSCULAR | Status: DC | PRN

## 2013-04-07 MED ORDER — MORPHINE SULFATE 2 MG/ML IJ SOLN
2.0000 mg | INTRAMUSCULAR | Status: DC | PRN
  Administered 2013-04-08: 2 mg via INTRAVENOUS
  Administered 2013-04-08: 4 mg via INTRAVENOUS
  Administered 2013-04-09: 2 mg via INTRAVENOUS
  Administered 2013-04-10: 4 mg via INTRAVENOUS
  Filled 2013-04-07: qty 2
  Filled 2013-04-07: qty 1
  Filled 2013-04-07: qty 2
  Filled 2013-04-07: qty 1

## 2013-04-07 MED ORDER — SUCCINYLCHOLINE CHLORIDE 20 MG/ML IJ SOLN
INTRAMUSCULAR | Status: DC | PRN
  Administered 2013-04-07: 100 mg via INTRAVENOUS

## 2013-04-07 MED ORDER — FENTANYL CITRATE 0.05 MG/ML IJ SOLN
INTRAMUSCULAR | Status: DC | PRN
  Administered 2013-04-07: 75 ug via INTRAVENOUS
  Administered 2013-04-07 (×2): 50 ug via INTRAVENOUS

## 2013-04-07 SURGICAL SUPPLY — 28 items
BANDAGE GAUZE ELAST BULKY 4 IN (GAUZE/BANDAGES/DRESSINGS) IMPLANT
CANISTER SUCTION 2500CC (MISCELLANEOUS) ×2 IMPLANT
CLOTH BEACON ORANGE TIMEOUT ST (SAFETY) ×2 IMPLANT
COVER SURGICAL LIGHT HANDLE (MISCELLANEOUS) ×2 IMPLANT
DRAPE LAPAROSCOPIC ABDOMINAL (DRAPES) IMPLANT
DRAPE PED LAPAROTOMY (DRAPES) ×1 IMPLANT
DRAPE UTILITY 15X26 W/TAPE STR (DRAPE) ×4 IMPLANT
DRSG PAD ABDOMINAL 8X10 ST (GAUZE/BANDAGES/DRESSINGS) ×3 IMPLANT
ELECT CAUTERY BLADE 6.4 (BLADE) ×2 IMPLANT
ELECT REM PT RETURN 9FT ADLT (ELECTROSURGICAL) ×2
ELECTRODE REM PT RTRN 9FT ADLT (ELECTROSURGICAL) ×1 IMPLANT
GAUZE PACKING IODOFORM 1/2 (PACKING) ×2 IMPLANT
GLOVE BIOGEL PI IND STRL 8 (GLOVE) ×1 IMPLANT
GLOVE BIOGEL PI INDICATOR 8 (GLOVE) ×1
GLOVE ECLIPSE 7.5 STRL STRAW (GLOVE) ×2 IMPLANT
GLOVE ECLIPSE 8.0 STRL XLNG CF (GLOVE) ×2 IMPLANT
GOWN STRL NON-REIN LRG LVL3 (GOWN DISPOSABLE) ×4 IMPLANT
KIT BASIN OR (CUSTOM PROCEDURE TRAY) ×2 IMPLANT
KIT ROOM TURNOVER OR (KITS) ×2 IMPLANT
NS IRRIG 1000ML POUR BTL (IV SOLUTION) ×2 IMPLANT
PACK GENERAL/GYN (CUSTOM PROCEDURE TRAY) ×2 IMPLANT
PAD ARMBOARD 7.5X6 YLW CONV (MISCELLANEOUS) ×2 IMPLANT
SPONGE GAUZE 4X4 12PLY (GAUZE/BANDAGES/DRESSINGS) ×3 IMPLANT
SWAB COLLECTION DEVICE MRSA (MISCELLANEOUS) ×1 IMPLANT
TAPE CLOTH SURG 4X10 WHT LF (GAUZE/BANDAGES/DRESSINGS) ×1 IMPLANT
TOWEL OR 17X24 6PK STRL BLUE (TOWEL DISPOSABLE) ×2 IMPLANT
TOWEL OR 17X26 10 PK STRL BLUE (TOWEL DISPOSABLE) ×2 IMPLANT
TUBE ANAEROBIC SPECIMEN COL (MISCELLANEOUS) ×1 IMPLANT

## 2013-04-07 NOTE — Anesthesia Preprocedure Evaluation (Addendum)
Anesthesia Evaluation  Patient identified by MRN, date of birth, ID band Patient awake    Reviewed: Allergy & Precautions, H&P , NPO status , Patient's Chart, lab work & pertinent test results  Airway Mallampati: II TM Distance: >3 FB Neck ROM: Full    Dental  (+) Poor Dentition   Pulmonary Current Smoker,    Pulmonary exam normal       Cardiovascular + angina + CAD and + Past MI     Neuro/Psych PSYCHIATRIC DISORDERS Schizophrenia DementiaCVA    GI/Hepatic negative GI ROS, Neg liver ROS,   Endo/Other  diabetes, Type 2  Renal/GU negative Renal ROS     Musculoskeletal   Abdominal   Peds  Hematology   Anesthesia Other Findings   Reproductive/Obstetrics                          Anesthesia Physical Anesthesia Plan  ASA: III  Anesthesia Plan: General   Post-op Pain Management:    Induction: Intravenous  Airway Management Planned: Oral ETT  Additional Equipment:   Intra-op Plan:   Post-operative Plan: Extubation in OR  Informed Consent: I have reviewed the patients History and Physical, chart, labs and discussed the procedure including the risks, benefits and alternatives for the proposed anesthesia with the patient or authorized representative who has indicated his/her understanding and acceptance.   Dental advisory given  Plan Discussed with: CRNA, Anesthesiologist and Surgeon  Anesthesia Plan Comments:        Anesthesia Quick Evaluation

## 2013-04-07 NOTE — Progress Notes (Signed)
This patient not only has a left groin hidradenitis, but a large left gluteal decubitus with foul-smelling bloody necrosis.  He has been on Plavix and ASA, so he is at risk for postoperative bleeding which can be controlled with packing.  Will take to the OR for I&D and decubitus management.  Marta Lamas. Gae Bon, MD, FACS [phone] [facility]

## 2013-04-07 NOTE — Transfer of Care (Signed)
Immediate Anesthesia Transfer of Care Note  Patient: Xavier Khan  Procedure(s) Performed: Procedure(s) with comments: DEBRIDEMENT WOUND (Left) - Left buttocks IRRIGATION AND DEBRIDEMENT ABSCESS (Left) - Left Groin  Patient Location: PACU  Anesthesia Type:General  Level of Consciousness: awake, alert  and oriented  Airway & Oxygen Therapy: Patient Spontanous Breathing and Patient connected to face mask oxygen  Post-op Assessment: Report given to PACU RN  Post vital signs: Reviewed and stable  Complications: No apparent anesthesia complications

## 2013-04-07 NOTE — Preoperative (Signed)
Beta Blockers   Reason not to administer Beta Blockers:Not Applicable 

## 2013-04-07 NOTE — Progress Notes (Signed)
[facility] PROGRESS NOTE  [name] Sunday OZH:[id number] DOB: [birth year] DOA: 03/30/2013 PCP: No PCP Per Patient Brief Narrative: Xavier Khan is a 57 y.o. male with known history of hidradenitis suppuritiva, CAD status post stenting, colon cancer status post surgery last year, CVA with left-sided hemiplegia, schizophrenia, cognitive deficits, chronic anemia was found to have increasing bleeding from his groin area where patient has hidranitis suppuritiva. As per patient's sister with whom patient lives patient was being treated with multiple does of antibiotics over the last one month it to increased drainage from the groin area. Patient also has been having slow blood loss from the area. Today patient was noticed to have more than usual blood loss and patient was felt to be weak. On arrival in the ER patient was found to be tachycardic and tachypneic and it improved with fluids. On exam patient had increase drainage from the groin area with blood. On-call surgeon was consulted and at this time patient has been admitted for worsening of is hidradenitis suppurativa   Assessment/Plan: 1. Hidradenitis suppurativa, Acute on Chronic infection - CT pelvis noted -S/p 5 days of IV Vanc/Zosyn, change to PO augmentin and Doxycycline on 5/28, we will continue with oral antibiotics.  -- plan for I&D tomorrow today of the left groin abscess.  -wound care consult noted and recommendations given.   2. Anemia - with blood loss from 1 and hemodilution,  -no GI bleeding noted, Anemia panel showed elevated ferritin, low saturation ratio's and low iron levels.  - as per RN, pt had little bleeding from the hidradenitis , he received one unit of prbc transfusion and his H&h improved to 9.    3. History of CVA with left-sided hemiplegia - continue plavix.   4. Diabetes mellitus type 2 - with hypoglycemic episodes -stopped oral hypoglycemics, SSI. He is no lonnger hypoglcemic episodes.  CBG (last 3)   Recent  Labs  04/07/13 0751 04/07/13 1146 04/07/13 1253  GLUCAP 132* 163* 172*      5. History of colon cancer status post resection last year.  6. Schizophrenia - continue cogentin, seroquel and risperdal. Psychiatry consulted appreciate recommendations.   8.    Hyperlipidemia - continue statin  9.     Gout - continue allopurinol.  10. Constipation; lactulose, dulcolax and miralax ordered, fleet today.  abd film if he doesn't have a BM today.   11. Mild hyponatremia: will continue to monitor.   Code Status: FULL Family Communication: discussed with sis ter in the room and on the phone on 5/29th.  Disposition Plan: I&D today and possible discharge to SNF on Monday.    Consultants:  CCS  Antibiotics:  Vanc 5/23 to 5/27  Zosyn 5/23 to 5/27  augmentin 5/28  Doxycycline.5/28  HPI/Subjective: Sleepy, comfortable no new complaints.   Objective: Filed Vitals:   04/07/13 1145 04/07/13 1200 04/07/13 1215 04/07/13 1252  BP: 120/69 120/61 118/66 117/70  Pulse: 94 86 95 98  Temp:      TempSrc:      Resp: 16 19 18    Height:      Weight:      SpO2: 100% 100% 97% 93%    Intake/Output Summary (Last 24 hours) at 04/07/13 1327 Last data filed at 04/07/13 1117  Gross per 24 hour  Intake 1467.5 ml  Output   1735 ml  Net -267.5 ml   Filed Weights   04/05/13 0500 04/06/13 0401 04/07/13 0427  Weight: 54.3 kg (119 lb 11.4 oz) 54.5 kg (120 lb  2.4 oz) 54.5 kg (120 lb 2.4 oz)    Exam:   General:  Alert, awake,  Oriented to self and person  Cardiovascular: S1S2/RRR  Respiratory: CTAB, diminished at bases  Abdomen: soft, NT, BS present Perineum: Thick, dark  induration of the left buttock, bilateral perineum, proximal medial thighs with small openings  Data Reviewed: Basic Metabolic Panel:  Recent Labs Lab 04/01/13 0340 04/03/13 1748 04/04/13 0350  NA 133* 129* 132*  K 4.1 4.7 4.1  CL 101 94* 97  CO2 25 24 29   GLUCOSE 130* 211* 133*  BUN 7 7 7   CREATININE  0.60 0.52 0.52  CALCIUM 8.3* 8.8 9.0   Liver Function Tests:  Recent Labs Lab 04/03/13 1748  AST 14  ALT 14  ALKPHOS 191*  BILITOT 0.4  PROT 9.8*  ALBUMIN 1.6*   No results found for this basename: LIPASE, AMYLASE,  in the last 168 hours No results found for this basename: AMMONIA,  in the last 168 hours CBC:  Recent Labs Lab 04/01/13 0340 04/03/13 1748 04/04/13 0350 04/05/13 0850 04/05/13 2056 04/06/13 0445  WBC 12.4* 12.4* 11.0* 14.4*  --   --   HGB 7.8* 8.7* 8.4* 8.3* 8.2* 9.2*  HCT 24.7* 26.5* 25.9* 24.9* 24.9* 27.7*  MCV 93.6 92.7 91.8 92.6  --   --   PLT 468* 482* 448* 438*  --   --    Cardiac Enzymes: No results found for this basename: CKTOTAL, CKMB, CKMBINDEX, TROPONINI,  in the last 168 hours BNP (last 3 results) No results found for this basename: PROBNP,  in the last 8760 hours CBG:  Recent Labs Lab 04/07/13 0102 04/07/13 0413 04/07/13 0751 04/07/13 1146 04/07/13 1253  GLUCAP 165* 141* 132* 163* 172*    Recent Results (from the past 240 hour(s))  CULTURE, BLOOD (ROUTINE X 2)     Status: None   Collection Time    03/30/13  6:00 PM      Result Value Range Status   Specimen Description BLOOD RIGHT WRIST   Final   Special Requests BOTTLES DRAWN AEROBIC ONLY 5CC   Final   Culture  Setup Time 03/31/2013 03:03   Final   Culture NO GROWTH 5 DAYS   Final   Report Status 04/06/2013 FINAL   Final  CULTURE, BLOOD (ROUTINE X 2)     Status: None   Collection Time    03/30/13  7:08 PM      Result Value Range Status   Specimen Description BLOOD HAND RIGHT   Final   Special Requests BOTTLES DRAWN AEROBIC ONLY 3CC   Final   Culture  Setup Time 03/31/2013 03:03   Final   Culture NO GROWTH 5 DAYS   Final   Report Status 04/06/2013 FINAL   Final  URINE CULTURE     Status: None   Collection Time    03/30/13  8:59 PM      Result Value Range Status   Specimen Description URINE, CLEAN CATCH   Final   Special Requests NONE   Final   Culture  Setup Time  03/31/2013 01:50   Final   Colony Count 3,000 COLONIES/ML   Final   Culture INSIGNIFICANT GROWTH   Final   Report Status 04/01/2013 FINAL   Final  GRAM STAIN     Status: None   Collection Time    03/30/13  8:59 PM      Result Value Range Status   Specimen Description URINE, CLEAN CATCH  Final   Special Requests NONE   Final   Gram Stain     Final   Value: CYTOSPIN     Neg for bacteria     WBC PRESENT,BOTH PMN AND MONONUCLEAR Only 2 cells seen on slide     Results Called toRayburn Ma, RN [name]   Report Status 03/30/2013 FINAL   Final  MRSA PCR SCREENING     Status: None   Collection Time    03/31/13 12:17 AM      Result Value Range Status   MRSA by PCR NEGATIVE  NEGATIVE Final   Comment:            The GeneXpert MRSA Assay (FDA     approved for NASAL specimens     only), is one component of a     comprehensive MRSA colonization     surveillance program. It is not     intended to diagnose MRSA     infection nor to guide or     monitor treatment for     MRSA infections.     Studies: No results found.  Scheduled Meds: . allopurinol  300 mg Oral Daily  . amoxicillin-clavulanate  1 tablet Oral BID WC  . atorvastatin  80 mg Oral Daily  . benztropine  1 mg Oral Daily  . doxycycline  100 mg Oral Q12H  . feeding supplement  237 mL Oral BID BM  . ferrous sulfate  325 mg Oral BID  . gabapentin  600 mg Oral QHS  . HYDROmorphone      . insulin aspart  0-9 Units Subcutaneous TID WC  . magnesium oxide  400 mg Oral TID  . mirtazapine  15 mg Oral QHS  . multivitamin with minerals  1 tablet Oral Daily  . oxyCODONE      . polyethylene glycol  17 g Oral Daily  . QUEtiapine  200 mg Oral QHS  . risperiDONE  3 mg Oral BID  . senna-docusate  1 tablet Oral BID  . sodium chloride  3 mL Intravenous Q12H   Continuous Infusions: . sodium chloride 50 mL/hr at 04/06/13 1933    Principal Problem:   Cellulitis Active Problems:   CAD (coronary artery disease)    Hidradenitis suppurativa   Diabetes mellitus   Schizophrenia   History of CVA (cerebrovascular accident)   Anemia   Hyperlipidemia   Gout   Dementia    Time spent:    [facility]  [facility] Pager [phone]. If 7PM-7AM, please contact night-coverage at [email or web address], password [facility] 04/07/2013, 1:27 PM  LOS: 8 days

## 2013-04-07 NOTE — Op Note (Addendum)
OPERATIVE REPORT  DATE OF OPERATION: 03/30/2013 - 04/07/2013  PATIENT:  Xavier Khan  57 y.o. male  PRE-OPERATIVE DIAGNOSIS:  Left groin abscess  POST-OPERATIVE DIAGNOSIS:  * No post-op diagnosis entered *  PROCEDURE:  Procedure(s): DEBRIDEMENT WOUND IRRIGATION AND NON-EXCISIONAL DEBRIDEMENT ABSCESS  SURGEON:  Surgeon(s): Cherylynn Ridges, MD  ASSISTANT: None  ANESTHESIA:   general  EBL: <50 ml  BLOOD ADMINISTERED: none  DRAINS: Packing left in left hi- wound and left groin.   SPECIMEN:  Source of Specimen:  cultures taken from left groin area  COUNTS CORRECT:  YES  PROCEDURE DETAILS: The patient was taken to the operating room and placed on the table in the supine position. After an adequate general endotracheal anesthetic was administered he was prepped and draped in usual sterile manner exposing his left groin area.  After proper time out was performed identifying the patient and the procedure to be performed we used a hemostat clamp in order to open all multiple pustules along the left inguinal area. Most of these are very small and superficial. The extended posteriorly and medially towards the scrotum. This represent a high-grade cutaneous hidradenitis.  The left inguinal area was opened with a #15 blade and taken down to the pocket which had minimal pus. We packed this with quarter inch iodoform gauze. The more superficial lesions on the medial aspect of the left side also packed with quarter-inch iodoform Nu Gauze. Cultures were sent for aerobic and anaerobic specimens.  A sterile dressing was applied to the left groin wound. We subsequently rolled the patient into the right lateral decubitus position and explore the left buttock decubitus area. This is very indurated skin which is probably Betadine. We then used a hemostat clamp nor to get into the opening which is already draining mucopurulent bloody fluid.  Using a hemostat clamp we had to break into very friable  cutaneous tissue which was subsequently opened posteriorly and anteriorly using the surgeon's finger. This was able to be down with the surgeon's finger easily. We subsequently packed this area which bled easily with quarter inch and half inch iodoform Nu Gauze. A sterile dressing was applied.  All counts were correct.  PATIENT DISPOSITION:  PACU - hemodynamically stable.   Cherylynn Ridges 5/31/201411:10 AM

## 2013-04-07 NOTE — Anesthesia Postprocedure Evaluation (Signed)
Anesthesia Post Note  Patient: Xavier Khan  Procedure(s) Performed: Procedure(s) (LRB): DEBRIDEMENT WOUND (Left) IRRIGATION AND DEBRIDEMENT ABSCESS (Left)  Anesthesia type: general  Patient location: PACU  Post pain: Pain level controlled  Post assessment: Patient's Cardiovascular Status Stable   Post vital signs: Reviewed and stable  Level of consciousness: sedated  Complications: No apparent anesthesia complications

## 2013-04-08 LAB — OSMOLALITY, URINE: Osmolality, Ur: 530 mOsm/kg (ref 390–1090)

## 2013-04-08 LAB — GLUCOSE, CAPILLARY
Glucose-Capillary: 200 mg/dL — ABNORMAL HIGH (ref 70–99)
Glucose-Capillary: 139 mg/dL — ABNORMAL HIGH (ref 70–99)
Glucose-Capillary: 247 mg/dL — ABNORMAL HIGH (ref 70–99)

## 2013-04-08 LAB — SODIUM, URINE, RANDOM: Sodium, Ur: 153 mEq/L

## 2013-04-08 LAB — BASIC METABOLIC PANEL
BUN: 9 mg/dL (ref 6–23)
CO2: 26 mEq/L (ref 19–32)
Calcium: 9 mg/dL (ref 8.4–10.5)
Chloride: 94 mEq/L — ABNORMAL LOW (ref 96–112)
Creatinine, Ser: 0.51 mg/dL (ref 0.50–1.35)
Glucose, Bld: 124 mg/dL — ABNORMAL HIGH (ref 70–99)

## 2013-04-08 LAB — CBC
HCT: 27.6 % — ABNORMAL LOW (ref 39.0–52.0)
Hemoglobin: 8.9 g/dL — ABNORMAL LOW (ref 13.0–17.0)
MCH: 30.2 pg (ref 26.0–34.0)
MCV: 93.6 fL (ref 78.0–100.0)
Platelets: 390 10*3/uL (ref 150–400)
RBC: 2.95 MIL/uL — ABNORMAL LOW (ref 4.22–5.81)
WBC: 11.6 10*3/uL — ABNORMAL HIGH (ref 4.0–10.5)

## 2013-04-08 NOTE — Progress Notes (Signed)
[facility] PROGRESS NOTE  Xavier Khan ZOX:[id number] DOB: 09/08/1956 DOA: 03/30/2013 PCP: No PCP Per Patient Brief Narrative: Xavier Khan is a 57 y.o. male with known history of hidradenitis suppuritiva, CAD status post stenting, colon cancer status post surgery last year, CVA with left-sided hemiplegia, schizophrenia, cognitive deficits, chronic anemia was found to have increasing bleeding from his groin area where patient has hidranitis suppuritiva. As per patient's sister with whom patient lives patient was being treated with multiple does of antibiotics over the last one month it to increased drainage from the groin area. Patient also has been having slow blood loss from the area. Today patient was noticed to have more than usual blood loss and patient was felt to be weak. On arrival in the ER patient was found to be tachycardic and tachypneic and it improved with fluids. On exam patient had increase drainage from the groin area with blood. On-call surgeon was consulted and at this time patient has been admitted for worsening of is hidradenitis suppurativa   Assessment/Plan: 1. Hidradenitis suppurativa, Acute on Chronic infection - CT pelvis noted -S/p 5 days of IV Vanc/Zosyn, change to PO augmentin and Doxycycline on 5/28, we will continue with oral antibiotics.  -- s/p  I&D on 5/31 of the left groin abscess. Cultures sent and pending.  -wound care consult noted and recommendations given.   2. Anemia - with blood loss from 1 and hemodilution,  -no GI bleeding noted, Anemia panel showed elevated ferritin, low saturation ratio's and low iron levels.  - as per RN, pt had little bleeding from the hidradenitis , he received one unit of prbc transfusion and his H&h improved to 9.    3. History of CVA with left-sided hemiplegia - continue plavix.   4. Diabetes mellitus type 2 - with hypoglycemic episodes -stopped oral hypoglycemics, SSI. He is no lonnger hypoglcemic episodes.  CBG (last  3)   Recent Labs  04/08/13 0001 04/08/13 0424 04/08/13 0742  GLUCAP 167* 115* 139*      5. History of colon cancer status post resection last year.  6. Schizophrenia - continue cogentin, seroquel and risperdal. Psychiatry consulted appreciate recommendations.   8.    Hyperlipidemia - continue statin  9.     Gout - continue allopurinol.  10. Constipation; lactulose, dulcolax and miralax ordered, fleet today.  abd film if he doesn't have a BM today.   11. Mild hyponatremia: not responding to fluids. 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 Will obtain TSH, urine sodium, creatinine, urine and serus osmolality. Stop the fluids.   Code Status: FULL Family Communication: discussed with sis ter in the room and on the phone on 5/29th.  Disposition Plan: I&D today and possible discharge to SNF on Monday.    Consultants:  CCS  Antibiotics:  Vanc 5/23 to 5/27  Zosyn 5/23 to 5/27  augmentin 5/28  Doxycycline.5/28  HPI/Subjective: Sleepy, comfortable no new complaints.   Objective: Filed Vitals:   04/07/13 1252 04/07/13 1508 04/07/13 1939 04/08/13 0450  BP: 117/70 129/74 113/61 106/63  Pulse: 98 116 111 99  Temp:  97.8 F (36.6 C) 97.8 F (36.6 C) 97.7 F (36.5 C)  TempSrc:  Oral Oral Oral  Resp:  18 18 20   Height:      Weight:    57.1 kg (125 lb 14.1 oz)  SpO2: 93% 98% 97% 98%    Intake/Output Summary (Last 24 hours) at 04/08/13 0929 Last data filed at 04/08/13 0800  Gross per 24 hour  Intake  1423 ml  Output   1060 ml  Net    363 ml   Filed Weights   04/06/13 0401 04/07/13 0427 04/08/13 0450  Weight: 54.5 kg (120 lb 2.4 oz) 54.5 kg (120 lb 2.4 oz) 57.1 kg (125 lb 14.1 oz)    Exam:   General:  Alert, awake,  Oriented to self and person  Cardiovascular: S1S2/RRR  Respiratory: CTAB, diminished at bases  Abdomen: soft, NT, BS present Perineum: Thick, dark  induration of the left buttock, bilateral perineum, proximal medial thighs with small openings  Data  Reviewed: Basic Metabolic Panel:  Recent Labs Lab 04/03/13 1748 04/04/13 0350 04/08/13 0505  NA 129* 132* 130*  K 4.7 4.1 4.0  CL 94* 97 94*  CO2 24 29 26   GLUCOSE 211* 133* 124*  BUN 7 7 9   CREATININE 0.52 0.52 0.51  CALCIUM 8.8 9.0 9.0   Liver Function Tests:  Recent Labs Lab 04/03/13 1748  AST 14  ALT 14  ALKPHOS 191*  BILITOT 0.4  PROT 9.8*  ALBUMIN 1.6*   No results found for this basename: LIPASE, AMYLASE,  in the last 168 hours No results found for this basename: AMMONIA,  in the last 168 hours CBC:  Recent Labs Lab 04/03/13 1748 04/04/13 0350 04/05/13 0850 04/05/13 2056 04/06/13 0445 04/08/13 0505  WBC 12.4* 11.0* 14.4*  --   --  11.6*  HGB 8.7* 8.4* 8.3* 8.2* 9.2* 8.9*  HCT 26.5* 25.9* 24.9* 24.9* 27.7* 27.6*  MCV 92.7 91.8 92.6  --   --  93.6  PLT 482* 448* 438*  --   --  390   Cardiac Enzymes: No results found for this basename: CKTOTAL, CKMB, CKMBINDEX, TROPONINI,  in the last 168 hours BNP (last 3 results) No results found for this basename: PROBNP,  in the last 8760 hours CBG:  Recent Labs Lab 04/07/13 1619 04/07/13 1937 04/08/13 0001 04/08/13 0424 04/08/13 0742  GLUCAP 272* 200* 167* 115* 139*    Recent Results (from the past 240 hour(s))  CULTURE, BLOOD (ROUTINE X 2)     Status: None   Collection Time    03/30/13  6:00 PM      Result Value Range Status   Specimen Description BLOOD RIGHT WRIST   Final   Special Requests BOTTLES DRAWN AEROBIC ONLY 5CC   Final   Culture  Setup Time 03/31/2013 03:03   Final   Culture NO GROWTH 5 DAYS   Final   Report Status 04/06/2013 FINAL   Final  CULTURE, BLOOD (ROUTINE X 2)     Status: None   Collection Time    03/30/13  7:08 PM      Result Value Range Status   Specimen Description BLOOD HAND RIGHT   Final   Special Requests BOTTLES DRAWN AEROBIC ONLY 3CC   Final   Culture  Setup Time 03/31/2013 03:03   Final   Culture NO GROWTH 5 DAYS   Final   Report Status 04/06/2013 FINAL   Final   URINE CULTURE     Status: None   Collection Time    03/30/13  8:59 PM      Result Value Range Status   Specimen Description URINE, CLEAN CATCH   Final   Special Requests NONE   Final   Culture  Setup Time 03/31/2013 01:50   Final   Colony Count 3,000 COLONIES/ML   Final   Culture INSIGNIFICANT GROWTH   Final   Report Status 04/01/2013 FINAL  Final  GRAM STAIN     Status: None   Collection Time    03/30/13  8:59 PM      Result Value Range Status   Specimen Description URINE, CLEAN CATCH   Final   Special Requests NONE   Final   Gram Stain     Final   Value: CYTOSPIN     Neg for bacteria     WBC PRESENT,BOTH PMN AND MONONUCLEAR Only 2 cells seen on slide     Results Called toRayburn Ma, RN [name]   Report Status 03/30/2013 FINAL   Final  MRSA PCR SCREENING     Status: None   Collection Time    03/31/13 12:17 AM      Result Value Range Status   MRSA by PCR NEGATIVE  NEGATIVE Final   Comment:            The GeneXpert MRSA Assay (FDA     approved for NASAL specimens     only), is one component of a     comprehensive MRSA colonization     surveillance program. It is not     intended to diagnose MRSA     infection nor to guide or     monitor treatment for     MRSA infections.  CULTURE, ROUTINE-ABSCESS     Status: None   Collection Time    04/07/13 11:14 AM      Result Value Range Status   Specimen Description ABSCESS LEFT GROIN   Final   Special Requests PATIENT ON FOLLOWING AUGMENTIN   Final   Gram Stain PENDING   Incomplete   Culture NO GROWTH 1 DAY   Final   Report Status PENDING   Incomplete     Studies: No results found.  Scheduled Meds: . allopurinol  300 mg Oral Daily  . amoxicillin-clavulanate  1 tablet Oral BID WC  . atorvastatin  80 mg Oral Daily  . benztropine  1 mg Oral Daily  . doxycycline  100 mg Oral Q12H  . feeding supplement  237 mL Oral BID BM  . ferrous sulfate  325 mg Oral BID  . gabapentin  600 mg Oral QHS  . insulin aspart   0-9 Units Subcutaneous TID WC  . magnesium oxide  400 mg Oral TID  . mirtazapine  15 mg Oral QHS  . multivitamin with minerals  1 tablet Oral Daily  . polyethylene glycol  17 g Oral Daily  . QUEtiapine  200 mg Oral QHS  . risperiDONE  3 mg Oral BID  . senna-docusate  1 tablet Oral BID  . sodium chloride  3 mL Intravenous Q12H  . sodium phosphate  1 enema Rectal Once   Continuous Infusions: . sodium chloride 50 mL/hr at 04/07/13 2033    Principal Problem:   Cellulitis Active Problems:   CAD (coronary artery disease)   Hidradenitis suppurativa   Diabetes mellitus   Schizophrenia   History of CVA (cerebrovascular accident)   Anemia   Hyperlipidemia   Gout   Dementia    Time spent:    [facility]  [facility] Pager [phone]. If 7PM-7AM, please contact night-coverage at [email or web address], password [facility] 04/08/2013, 9:29 AM  LOS: 9 days

## 2013-04-08 NOTE — Progress Notes (Signed)
1 Day Post-Op  Subjective: No c/o. Eating breakfast  Objective: Vital signs in last 24 hours: Temp:  [97.7 F (36.5 C)-98 F (36.7 C)] 97.7 F (36.5 C) (06/01 0450) Pulse Rate:  [62-116] 99 (06/01 0450) Resp:  [16-21] 20 (06/01 0450) BP: (106-129)/(61-74) 106/63 mmHg (06/01 0450) SpO2:  [93 %-100 %] 98 % (06/01 0450) Weight:  [125 lb 14.1 oz (57.1 kg)] 125 lb 14.1 oz (57.1 kg) (06/01 0450) Last BM Date:  (pre-admission)  Intake/Output from previous day: 05/31 0701 - 06/01 0700 In: 1183 [P.O.:480; I.V.:703] Out: 1585 [Urine:1575; Blood:10] Intake/Output this shift:    Alert, nad L groin dressing - intact  Lab Results:   Recent Labs  04/05/13 0850  04/06/13 0445 04/08/13 0505  WBC 14.4*  --   --  11.6*  HGB 8.3*  < > 9.2* 8.9*  HCT 24.9*  < > 27.7* 27.6*  PLT 438*  --   --  390  < > = values in this interval not displayed. BMET  Recent Labs  04/08/13 0505  NA 130*  K 4.0  CL 94*  CO2 26  GLUCOSE 124*  BUN 9  CREATININE 0.51  CALCIUM 9.0   PT/INR No results found for this basename: LABPROT, INR,  in the last 72 hours ABG No results found for this basename: PHART, PCO2, PO2, HCO3,  in the last 72 hours  Studies/Results: No results found.  Anti-infectives: Anti-infectives   Start     Dose/Rate Route Frequency Ordered Stop   04/04/13 0730  amoxicillin-clavulanate (AUGMENTIN) 500-125 MG per tablet 500 mg     1 tablet Oral 2 times daily with meals 04/03/13 1727     04/03/13 2200  doxycycline (VIBRA-TABS) tablet 100 mg     100 mg Oral Every 12 hours 04/03/13 1633     04/03/13 1800  amoxicillin-clavulanate (AUGMENTIN) 500-125 MG per tablet 500 mg  Status:  Discontinued     1 tablet Oral 2 times daily with meals 04/03/13 1633 04/03/13 1726   04/03/13 1200  amoxicillin-clavulanate (AUGMENTIN) 500-125 MG per tablet 500 mg  Status:  Discontinued     1 tablet Oral 2 times daily with meals 04/03/13 1726 04/03/13 1727   04/01/13 2100  vancomycin (VANCOCIN) IVPB  750 mg/150 ml premix  Status:  Discontinued     750 mg 150 mL/hr over 60 Minutes Intravenous Every 12 hours 04/01/13 2052 04/03/13 1633   03/31/13 0800  vancomycin (VANCOCIN) IVPB 1000 mg/200 mL premix  Status:  Discontinued     1,000 mg 200 mL/hr over 60 Minutes Intravenous Every 12 hours 03/30/13 1835 04/01/13 2052   03/30/13 2200  piperacillin-tazobactam (ZOSYN) IVPB 3.375 g  Status:  Discontinued     3.375 g 12.5 mL/hr over 240 Minutes Intravenous 3 times per day 03/30/13 1835 04/03/13 1633   03/30/13 1730  piperacillin-tazobactam (ZOSYN) IVPB 3.375 g     3.375 g 100 mL/hr over 30 Minutes Intravenous  Once 03/30/13 1724 03/30/13 2106   03/30/13 1730  vancomycin (VANCOCIN) IVPB 1000 mg/200 mL premix     1,000 mg 200 mL/hr over 60 Minutes Intravenous  Once 03/30/13 1724 03/30/13 2206      Assessment/Plan: s/p Procedure(s) with comments: DEBRIDEMENT WOUND (Left) - Left buttocks IRRIGATION AND DEBRIDEMENT ABSCESS (Left) - Left Groin  Doing ok Start dressing changes Monday  Xavier Khan. [name], MD, FACS General, Bariatric, & Minimally Invasive Surgery [facility], [place]   LOS: 9 days    Xavier Khan 04/08/2013

## 2013-04-09 ENCOUNTER — Encounter (HOSPITAL_COMMUNITY): Payer: Self-pay | Admitting: General Surgery

## 2013-04-09 ENCOUNTER — Telehealth: Payer: Self-pay | Admitting: General Surgery

## 2013-04-09 LAB — GLUCOSE, CAPILLARY
Glucose-Capillary: 203 mg/dL — ABNORMAL HIGH (ref 70–99)
Glucose-Capillary: 273 mg/dL — ABNORMAL HIGH (ref 70–99)
Glucose-Capillary: 349 mg/dL — ABNORMAL HIGH (ref 70–99)

## 2013-04-09 NOTE — Progress Notes (Signed)
D/c plan is for pt to d/c to SNF tomorrow after hydrotherapy.

## 2013-04-09 NOTE — Progress Notes (Signed)
Physical Therapy Wound Treatment Patient Details  Name: Xavier Khan MRN: [id number] Date of Birth: Mar 26, 1956  Today's Date: 04/09/2013 Time: 1478-2956 Time Calculation (min): 38 min  Subjective  Patient and Family Stated Goals: no goal stated--pt agreed we needed to get it clean  Pain Score: Pain Score: 0-No pain  Wound Assessment  Wound 04/01/13 Other (Comment) Multiple wound sites: Axilla, groin, Buttocks, thigh (Active)  Site / Wound Assessment Bleeding;Painful;Red 04/09/2013 11:29 AM  % Wound base Red or Granulating 90% 04/09/2013 11:29 AM  % Wound base Black 10% 04/09/2013 11:29 AM  Peri-wound Assessment Erythema (blanchable) 04/09/2013 11:29 AM  Wound Length (cm) 7.3 cm 04/09/2013 11:29 AM  Wound Width (cm) 5 cm 04/09/2013 11:29 AM  Wound Depth (cm) 1.2 cm 04/09/2013 11:29 AM  Undermining (cm) 1 oclock 4.5 cm, 4-6 oclock 2.0 cm 04/09/2013 11:29 AM  Margins Unattacted edges (unapproximated) 04/09/2013 11:29 AM  Closure None 04/09/2013 11:29 AM  Drainage Amount Moderate 04/09/2013 11:29 AM  Drainage Description Sanguineous 04/09/2013 11:29 AM  Non-staged Wound Description Not applicable 04/09/2013 11:29 AM  Treatment Cleansed;Hydrotherapy (Pulse lavage);Packing (Plain strip);Pressure applied 04/09/2013 11:29 AM  Dressing Type ABD;Gauze (Comment);Moist to dry;Other (Comment) 04/09/2013 11:29 AM  Dressing Changed Changed 04/09/2013 11:29 AM  Dressing Status Clean;Dry;Intact 04/09/2013 11:29 AM     Incision 04/07/13 Groin Left (Active)  Site / Wound Assessment Clean;Dry 04/09/2013  8:58 AM  Margins Unattacted edges (unapproximated) 04/09/2013  8:58 AM  Closure None 04/09/2013  8:58 AM  Drainage Amount None 04/09/2013  8:58 AM  Dressing Type Gauze (Comment);ABD 04/08/2013 10:30 PM  Dressing Changed 04/08/2013 10:30 PM     Incision 04/07/13 Buttocks Left (Active)  Site / Wound Assessment Clean;Dry 04/09/2013  8:58 AM  Margins Unattacted edges (unapproximated) 04/09/2013  8:58 AM  Drainage Amount None 04/09/2013  8:58 AM   Dressing Type Gauze (Comment);ABD 04/08/2013 10:30 PM  Dressing Changed 04/08/2013 10:30 PM   Hydrotherapy Pulsed lavage therapy - wound location: L hip Pulsed Lavage with Suction (psi): 4 psi Pulsed Lavage with Suction - Normal Saline Used: 1000 mL Pulsed Lavage Tip: Tip with splash shield   Wound Assessment and Plan  Wound Therapy - Assess/Plan/Recommendations Wound Therapy - Clinical Statement: Using PLS hope to cleanse the wound, decr bacterial load, and prep for any selective debridement to promote healing. Wound Therapy - Functional Problem List: immobility Factors Delaying/Impairing Wound Healing: Immobility;Multiple medical problems Hydrotherapy Plan: Debridement;Patient/family education;Pulsatile lavage with suction Wound Therapy - Frequency: 6X / week Wound Therapy - Follow Up Recommendations: Skilled nursing facility Wound Plan: promote healthy wound with PLS to cleanse and selective debridement to prep. for healing,.  Wound Therapy Goals- Improve the function of patient's integumentary system by progressing the wound(s) through the phases of wound healing (inflammation - proliferation - remodeling) by: Decrease Necrotic Tissue to: 0% Decrease Necrotic Tissue - Progress: Goal set today Increase Granulation Tissue to: 100% (including all healthy tissues) Increase Granulation Tissue - Progress: Goal set today Improve Drainage Characteristics: Min;Serous Improve Drainage Characteristics - Progress: Goal set today Time For Goal Achievement: 2 weeks Wound Therapy - Potential for Goals: Good  Goals will be updated until maximal potential achieved or discharge criteria met.  Discharge criteria: when goals achieved, discharge from hospital, MD decision/surgical intervention, no progress towards goals, refusal/missing three consecutive treatments without notification or medical reason.  GP     Zoraida Havrilla, Eliseo Gum 04/09/2013, 11:47 AM 04/09/2013  Marietta Bing,  PT [phone]  (pager)

## 2013-04-09 NOTE — Clinical Documentation Improvement (Signed)
EXCISIONAL DEBRIDEMENT DOCUMENTATION CLARIFICATION  THIS DOCUMENT IS NOT A PERMANENT PART OF THE MEDICAL RECORD  TO RESPOND TO THE THIS QUERY, FOLLOW THE INSTRUCTIONS BELOW:  1. If needed, update documentation for the patient's encounter via the notes activity.  2. Access this query again and click edit on the In [employer].  3. After updating, or not, click F2 to complete all highlighted (required) fields concerning your review. Select "additional documentation in the medical record" OR "no additional documentation provided".  4. Click Sign note button.  5. The deficiency will fall out of your In Basket *Please let us know if you are not able to complete this workflow by phone or e-mail (listed below).  Please update your documentation within the medical record to reflect your response to this query.                                                                                        04/09/13   Dear Dr.Kaydn Kumpf / Associates,  In a better effort to capture your patient's severity of illness, reflect appropriate length of stay and utilization of resources, a review of the patient medical record has revealed the following indicators.    Based on your clinical judgment, please clarify and document in a progress note and/or discharge summary the clinical condition associated with the following supporting information:  In responding to this query please exercise your independent judgment.  The fact that a query is asked, does not imply that any particular answer is desired or expected. Because there is documentation in the medical record of "debridement" clarification is needed.  Please document the below (4) key elements in a progress note:  1.  Type of Debridement:          Excisional Debridement-  Cutting away necrotic, devitalized tissue or slough to  Level of viable tissue using a sharp instrument (example, scalpel, scissors, etc).           NON Excisional Debridement-  The removal  of necrotic, devitalized tissue or slough by means of scraping, mechanical brushing, flushing, or washing. (example: irrigation, whirlpool);  Minor removal of loose fragments.    2.  Please document depth of the debridement:             Partial Thickness  Full Thickness  Skin and Subcutaneous Tissue  Subcutaneous Tissue and Muscle  Subcutaneous Tissue, Muscle and Bone  Other     You may use possible, probable, or suspect with inpatient documentation. possible, probable, suspected diagnoses MUST be documented at the time of discharge  Reviewed: additional documentation in the medical record  Thank You,  Marciano Sequin,  Clinical Documentation Specialist:  Phone: [phone]  Health Information Management [place]

## 2013-04-09 NOTE — Progress Notes (Signed)
[facility] PROGRESS NOTE  Braeton Wolgamott NWG:[id number] DOB: [birth year] DOA: 03/30/2013 PCP: No PCP Per Patient Brief Narrative: Yehya Brendle is a 57 y.o. male with known history of hidradenitis suppuritiva, CAD status post stenting, colon cancer status post surgery last year, CVA with left-sided hemiplegia, schizophrenia, cognitive deficits, chronic anemia was found to have increasing bleeding from his groin area where patient has hidranitis suppuritiva. As per patient's sister with whom patient lives patient was being treated with multiple does of antibiotics over the last one month it to increased drainage from the groin area. Patient also has been having slow blood loss from the area. Today patient was noticed to have more than usual blood loss and patient was felt to be weak. On arrival in the ER patient was found to be tachycardic and tachypneic and it improved with fluids. On exam patient had increase drainage from the groin area with blood. On-call surgeon was consulted and at this time patient has been admitted for worsening of is hidradenitis suppurativa   Assessment/Plan: 1. Hidradenitis suppurativa, Acute on Chronic infection - CT pelvis noted -S/p 5 days of IV Vanc/Zosyn, change to PO augmentin and Doxycycline on 5/28, we will continue with oral antibiotics.  -- s/p  I&D on 5/31 of the left groin abscess. Cultures sent and pending.  -wound care consult noted and recommendations given.   2. Anemia - with blood loss from 1 and hemodilution,  -no GI bleeding noted, Anemia panel showed elevated ferritin, low saturation ratio's and low iron levels.  - as per RN, pt had little bleeding from the hidradenitis , he received one unit of prbc transfusion and his H&h improved to 9.    3. History of CVA with left-sided hemiplegia - continue plavix.   4. Diabetes mellitus type 2 - with hypoglycemic episodes -stopped oral hypoglycemics, SSI. He is no lonnger hypoglcemic episodes.  CBG (last  3)   Recent Labs  04/09/13 0803 04/09/13 1135 04/09/13 1626  GLUCAP 182* 280* 349*      5. History of colon cancer status post resection last year.  6. Schizophrenia - continue cogentin, seroquel and risperdal. Psychiatry consulted appreciate recommendations.   8.    Hyperlipidemia - continue statin  9.     Gout - continue allopurinol.  10. Constipation; lactulose, dulcolax and miralax ordered, fleet today.  abd film if he doesn't have a BM today.   11. Mild hyponatremia: not responding to fluids. 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 Will obtain TSH, urine sodium, creatinine, urine and serus osmolality. Stop the fluids.   Code Status: FULL Family Communication: discussed with sis ter in the room and on the phone on 5/29th.  Disposition Plan: I&D today and possible discharge to SNF on Monday.    Consultants:  CCS  Antibiotics:  Vanc 5/23 to 5/27  Zosyn 5/23 to 5/27  augmentin 5/28  Doxycycline.5/28  HPI/Subjective: Sleepy, comfortable no new complaints.   Objective: Filed Vitals:   04/08/13 1548 04/08/13 1947 04/09/13 0431 04/09/13 1416  BP: 119/70 115/67 112/64 112/71  Pulse: 108 102 104 105  Temp: 97.6 F (36.4 C) 98.7 F (37.1 C) 98.2 F (36.8 C) 98.9 F (37.2 C)  TempSrc: Oral Oral Oral Oral  Resp: 20 18 20 16   Height:      Weight:   57.743 kg (127 lb 4.8 oz)   SpO2: 99% 99% 99% 99%    Intake/Output Summary (Last 24 hours) at 04/09/13 1709 Last data filed at 04/09/13 1151  Gross per 24 hour  Intake    680 ml  Output   1400 ml  Net   -720 ml   Filed Weights   04/07/13 0427 04/08/13 0450 04/09/13 0431  Weight: 54.5 kg (120 lb 2.4 oz) 57.1 kg (125 lb 14.1 oz) 57.743 kg (127 lb 4.8 oz)    Exam:   General:  Alert, awake,  Oriented to self and person  Cardiovascular: S1S2/RRR  Respiratory: CTAB, diminished at bases  Abdomen: soft, NT, BS present Perineum: Thick, dark  induration of the left buttock, bilateral perineum, proximal medial thighs with  small openings  Data Reviewed: Basic Metabolic Panel:  Recent Labs Lab 04/03/13 1748 04/04/13 0350 04/08/13 0505  NA 129* 132* 130*  K 4.7 4.1 4.0  CL 94* 97 94*  CO2 24 29 26   GLUCOSE 211* 133* 124*  BUN 7 7 9   CREATININE 0.52 0.52 0.51  CALCIUM 8.8 9.0 9.0   Liver Function Tests:  Recent Labs Lab 04/03/13 1748  AST 14  ALT 14  ALKPHOS 191*  BILITOT 0.4  PROT 9.8*  ALBUMIN 1.6*   No results found for this basename: LIPASE, AMYLASE,  in the last 168 hours No results found for this basename: AMMONIA,  in the last 168 hours CBC:  Recent Labs Lab 04/03/13 1748 04/04/13 0350 04/05/13 0850 04/05/13 2056 04/06/13 0445 04/08/13 0505  WBC 12.4* 11.0* 14.4*  --   --  11.6*  HGB 8.7* 8.4* 8.3* 8.2* 9.2* 8.9*  HCT 26.5* 25.9* 24.9* 24.9* 27.7* 27.6*  MCV 92.7 91.8 92.6  --   --  93.6  PLT 482* 448* 438*  --   --  390   Cardiac Enzymes: No results found for this basename: CKTOTAL, CKMB, CKMBINDEX, TROPONINI,  in the last 168 hours BNP (last 3 results) No results found for this basename: PROBNP,  in the last 8760 hours CBG:  Recent Labs Lab 04/08/13 2359 04/09/13 0430 04/09/13 0803 04/09/13 1135 04/09/13 1626  GLUCAP 203* 161* 182* 280* 349*    Recent Results (from the past 240 hour(s))  CULTURE, BLOOD (ROUTINE X 2)     Status: None   Collection Time    03/30/13  6:00 PM      Result Value Range Status   Specimen Description BLOOD RIGHT WRIST   Final   Special Requests BOTTLES DRAWN AEROBIC ONLY 5CC   Final   Culture  Setup Time 03/31/2013 03:03   Final   Culture NO GROWTH 5 DAYS   Final   Report Status 04/06/2013 FINAL   Final  CULTURE, BLOOD (ROUTINE X 2)     Status: None   Collection Time    03/30/13  7:08 PM      Result Value Range Status   Specimen Description BLOOD HAND RIGHT   Final   Special Requests BOTTLES DRAWN AEROBIC ONLY 3CC   Final   Culture  Setup Time 03/31/2013 03:03   Final   Culture NO GROWTH 5 DAYS   Final   Report Status  04/06/2013 FINAL   Final  URINE CULTURE     Status: None   Collection Time    03/30/13  8:59 PM      Result Value Range Status   Specimen Description URINE, CLEAN CATCH   Final   Special Requests NONE   Final   Culture  Setup Time 03/31/2013 01:50   Final   Colony Count 3,000 COLONIES/ML   Final   Culture INSIGNIFICANT GROWTH   Final   Report Status 04/01/2013  FINAL   Final  GRAM STAIN     Status: None   Collection Time    03/30/13  8:59 PM      Result Value Range Status   Specimen Description URINE, CLEAN CATCH   Final   Special Requests NONE   Final   Gram Stain     Final   Value: CYTOSPIN     Neg for bacteria     WBC PRESENT,BOTH PMN AND MONONUCLEAR Only 2 cells seen on slide     Results Called toRayburn Ma, RN [name]   Report Status 03/30/2013 FINAL   Final  MRSA PCR SCREENING     Status: None   Collection Time    03/31/13 12:17 AM      Result Value Range Status   MRSA by PCR NEGATIVE  NEGATIVE Final   Comment:            The GeneXpert MRSA Assay (FDA     approved for NASAL specimens     only), is one component of a     comprehensive MRSA colonization     surveillance program. It is not     intended to diagnose MRSA     infection nor to guide or     monitor treatment for     MRSA infections.  CULTURE, ROUTINE-ABSCESS     Status: None   Collection Time    04/07/13 11:14 AM      Result Value Range Status   Specimen Description ABSCESS LEFT GROIN   Final   Special Requests PATIENT ON FOLLOWING AUGMENTIN   Final   Gram Stain PENDING   Incomplete   Culture NO GROWTH 2 DAYS   Final   Report Status PENDING   Incomplete  ANAEROBIC CULTURE     Status: None   Collection Time    04/07/13 11:14 AM      Result Value Range Status   Specimen Description ABSCESS LEFT GROIN   Final   Special Requests PATIENT ON FOLLOWING AUGMENTIN   Final   Gram Stain PENDING   Incomplete   Culture     Final   Value: NO ANAEROBES ISOLATED; CULTURE IN PROGRESS FOR 5 DAYS    Report Status PENDING   Incomplete     Studies: No results found.  Scheduled Meds: . allopurinol  300 mg Oral Daily  . amoxicillin-clavulanate  1 tablet Oral BID WC  . atorvastatin  80 mg Oral Daily  . benztropine  1 mg Oral Daily  . doxycycline  100 mg Oral Q12H  . feeding supplement  237 mL Oral BID BM  . ferrous sulfate  325 mg Oral BID  . gabapentin  600 mg Oral QHS  . insulin aspart  0-9 Units Subcutaneous TID WC  . magnesium oxide  400 mg Oral TID  . mirtazapine  15 mg Oral QHS  . multivitamin with minerals  1 tablet Oral Daily  . polyethylene glycol  17 g Oral Daily  . QUEtiapine  200 mg Oral QHS  . risperiDONE  3 mg Oral BID  . senna-docusate  1 tablet Oral BID  . sodium chloride  3 mL Intravenous Q12H  . sodium phosphate  1 enema Rectal Once   Continuous Infusions:    Principal Problem:   Cellulitis Active Problems:   CAD (coronary artery disease)   Hidradenitis suppurativa   Diabetes mellitus   Schizophrenia   History of CVA (cerebrovascular accident)   Anemia   Hyperlipidemia  Gout   Dementia    Time spent:    [facility]  [facility] Pager [phone]. If 7PM-7AM, please contact night-coverage at [email or web address], password [facility] 04/09/2013, 5:09 PM  LOS: 10 days

## 2013-04-09 NOTE — Progress Notes (Signed)
2 Days Post-Op  Subjective: No complaints.  Objective: Vital signs in last 24 hours: Temp:  [97.6 F (36.4 C)-98.7 F (37.1 C)] 98.2 F (36.8 C) (06/02 0431) Pulse Rate:  [102-108] 104 (06/02 0431) Resp:  [18-20] 20 (06/02 0431) BP: (112-119)/(64-70) 112/64 mmHg (06/02 0431) SpO2:  [99 %] 99 % (06/02 0431) Weight:  [127 lb 4.8 oz (57.743 kg)] 127 lb 4.8 oz (57.743 kg) (06/02 0431) Last BM Date: 04/08/13  Intake/Output from previous day: 06/01 0701 - 06/02 0700 In: 720 [P.O.:720] Out: 1750 [Urine:1750] Intake/Output this shift: Total I/O In: 240 [P.O.:240] Out: 600 [Urine:600]  Incision/Wound:left thigh packing removed.  clean  Lab Results:   Recent Labs  04/08/13 0505  WBC 11.6*  HGB 8.9*  HCT 27.6*  PLT 390   BMET  Recent Labs  04/08/13 0505  NA 130*  K 4.0  CL 94*  CO2 26  GLUCOSE 124*  BUN 9  CREATININE 0.51  CALCIUM 9.0   PT/INR No results found for this basename: LABPROT, INR,  in the last 72 hours ABG No results found for this basename: PHART, PCO2, PO2, HCO3,  in the last 72 hours  Studies/Results: No results found.  Anti-infectives: Anti-infectives   Start     Dose/Rate Route Frequency Ordered Stop   04/04/13 0730  amoxicillin-clavulanate (AUGMENTIN) 500-125 MG per tablet 500 mg     1 tablet Oral 2 times daily with meals 04/03/13 1727     04/03/13 2200  doxycycline (VIBRA-TABS) tablet 100 mg     100 mg Oral Every 12 hours 04/03/13 1633     04/03/13 1800  amoxicillin-clavulanate (AUGMENTIN) 500-125 MG per tablet 500 mg  Status:  Discontinued     1 tablet Oral 2 times daily with meals 04/03/13 1633 04/03/13 1726   04/03/13 1200  amoxicillin-clavulanate (AUGMENTIN) 500-125 MG per tablet 500 mg  Status:  Discontinued     1 tablet Oral 2 times daily with meals 04/03/13 1726 04/03/13 1727   04/01/13 2100  vancomycin (VANCOCIN) IVPB 750 mg/150 ml premix  Status:  Discontinued     750 mg 150 mL/hr over 60 Minutes Intravenous Every 12 hours  04/01/13 2052 04/03/13 1633   03/31/13 0800  vancomycin (VANCOCIN) IVPB 1000 mg/200 mL premix  Status:  Discontinued     1,000 mg 200 mL/hr over 60 Minutes Intravenous Every 12 hours 03/30/13 1835 04/01/13 2052   03/30/13 2200  piperacillin-tazobactam (ZOSYN) IVPB 3.375 g  Status:  Discontinued     3.375 g 12.5 mL/hr over 240 Minutes Intravenous 3 times per day 03/30/13 1835 04/03/13 1633   03/30/13 1730  piperacillin-tazobactam (ZOSYN) IVPB 3.375 g     3.375 g 100 mL/hr over 30 Minutes Intravenous  Once 03/30/13 1724 03/30/13 2106   03/30/13 1730  vancomycin (VANCOCIN) IVPB 1000 mg/200 mL premix     1,000 mg 200 mL/hr over 60 Minutes Intravenous  Once 03/30/13 1724 03/30/13 2206      Assessment/Plan: s/p Procedure(s) with comments: DEBRIDEMENT WOUND (Left) - Left buttocks IRRIGATION AND DEBRIDEMENT ABSCESS (Left) - Left Groin Packing removed.  Start hydrotherapy.  LOS: 10 days    Samatha Anspach,Keahi A. 04/09/2013

## 2013-04-09 NOTE — Progress Notes (Signed)
Hydrotherapy to be done today about 1045 by PT; pt given 2mg  IV Morphine at this time in prep for dressing changes; will cont. To monitor.

## 2013-04-10 LAB — GLUCOSE, CAPILLARY: Glucose-Capillary: 169 mg/dL — ABNORMAL HIGH (ref 70–99)

## 2013-04-10 LAB — CULTURE, ROUTINE-ABSCESS: Gram Stain: NONE SEEN

## 2013-04-10 MED ORDER — AMOXICILLIN-POT CLAVULANATE 500-125 MG PO TABS: 1.0000 | ORAL_TABLET | Freq: Two times a day (BID) | ORAL | Status: DC

## 2013-04-10 MED ORDER — ADULT MULTIVITAMIN W/MINERALS CH: 1.0000 | ORAL_TABLET | Freq: Every day | ORAL | Status: AC

## 2013-04-10 MED ORDER — DOXYCYCLINE HYCLATE 100 MG PO TABS: 100.0000 mg | ORAL_TABLET | Freq: Two times a day (BID) | ORAL | Status: DC

## 2013-04-10 NOTE — Clinical Social Work Note (Signed)
Clinical Social Worker is continuing to follow. Patient is planned for placement at [facility].   Rozetta Nunnery MSW, [employer] [phone]

## 2013-04-10 NOTE — Progress Notes (Signed)
Wound care.

## 2013-04-10 NOTE — Progress Notes (Signed)
Patient ID: Xavier Khan, male   DOB: Jan 09, 1956, 57 y.o.   MRN: [id number] 3 Days Post-Op  Subjective: No complaints.  Denies significant pain  Objective: Vital signs in last 24 hours: Temp:  [98.1 F (36.7 C)-98.9 F (37.2 C)] 98.1 F (36.7 C) (06/03 0437) Pulse Rate:  [97-105] 102 (06/03 0437) Resp:  [16-19] 17 (06/03 0437) BP: (97-112)/(55-71) 97/69 mmHg (06/03 0437) SpO2:  [99 %-100 %] 100 % (06/03 0437) Weight:  [122 lb 12.7 oz (55.7 kg)] 122 lb 12.7 oz (55.7 kg) (06/03 0437) Last BM Date: 04/08/13  Intake/Output from previous day: 06/02 0701 - 06/03 0700 In: 1040 [P.O.:960; I.V.:80] Out: 1775 [Urine:1775] Intake/Output this shift:    Incision/Wound: dressing removed, wound ok, some serosang drainage  Lab Results:   Recent Labs  04/08/13 0505  WBC 11.6*  HGB 8.9*  HCT 27.6*  PLT 390   BMET  Recent Labs  04/08/13 0505  NA 130*  K 4.0  CL 94*  CO2 26  GLUCOSE 124*  BUN 9  CREATININE 0.51  CALCIUM 9.0   PT/INR No results found for this basename: LABPROT, INR,  in the last 72 hours ABG No results found for this basename: PHART, PCO2, PO2, HCO3,  in the last 72 hours  Studies/Results: No results found.  Anti-infectives: Anti-infectives   Start     Dose/Rate Route Frequency Ordered Stop   04/04/13 0730  amoxicillin-clavulanate (AUGMENTIN) 500-125 MG per tablet 500 mg     1 tablet Oral 2 times daily with meals 04/03/13 1727     04/03/13 2200  doxycycline (VIBRA-TABS) tablet 100 mg     100 mg Oral Every 12 hours 04/03/13 1633     04/03/13 1800  amoxicillin-clavulanate (AUGMENTIN) 500-125 MG per tablet 500 mg  Status:  Discontinued     1 tablet Oral 2 times daily with meals 04/03/13 1633 04/03/13 1726   04/03/13 1200  amoxicillin-clavulanate (AUGMENTIN) 500-125 MG per tablet 500 mg  Status:  Discontinued     1 tablet Oral 2 times daily with meals 04/03/13 1726 04/03/13 1727   04/01/13 2100  vancomycin (VANCOCIN) IVPB 750 mg/150 ml premix  Status:   Discontinued     750 mg 150 mL/hr over 60 Minutes Intravenous Every 12 hours 04/01/13 2052 04/03/13 1633   03/31/13 0800  vancomycin (VANCOCIN) IVPB 1000 mg/200 mL premix  Status:  Discontinued     1,000 mg 200 mL/hr over 60 Minutes Intravenous Every 12 hours 03/30/13 1835 04/01/13 2052   03/30/13 2200  piperacillin-tazobactam (ZOSYN) IVPB 3.375 g  Status:  Discontinued     3.375 g 12.5 mL/hr over 240 Minutes Intravenous 3 times per day 03/30/13 1835 04/03/13 1633   03/30/13 1730  piperacillin-tazobactam (ZOSYN) IVPB 3.375 g     3.375 g 100 mL/hr over 30 Minutes Intravenous  Once 03/30/13 1724 03/30/13 2106   03/30/13 1730  vancomycin (VANCOCIN) IVPB 1000 mg/200 mL premix     1,000 mg 200 mL/hr over 60 Minutes Intravenous  Once 03/30/13 1724 03/30/13 2206      Assessment/Plan: s/p Procedure(s) with comments: DEBRIDEMENT WOUND (Left) - Left buttocks IRRIGATION AND DEBRIDEMENT ABSCESS (Left) - Left Groin Packing removed.  Start hydrotherapy. Continue dressing changes On Augmentin, treat for total of 10 days  LOS: 11 days    Halo Laski 04/10/2013

## 2013-04-10 NOTE — Clinical Social Work Note (Signed)
Pt for transfer to [facility] today via [facility]. Pt and SNF aware of transfer. D/C packet complete with chart copy, signed Rx, and signed FL2. CSW signing off as no other CSW needs identified at this time.  Dellie Burns, MSW, LCSWA [phone] (coverage)

## 2013-04-10 NOTE — Progress Notes (Signed)
Physical Therapy Wound Treatment Patient Details  Name: Xavier Khan MRN: [id number] Date of Birth: 24-Jun-1956  Today's Date: 04/10/2013 Time: 4098-1191 Time Calculation (min): 29 min  Subjective  Subjective: I am hurting  (not rated) Patient and Family Stated Goals: no goal stated--pt agreed we needed to get it clean  Pain Score: Pain Score: Asleep  Wound Assessment  Wound 04/01/13 Other (Comment) Multiple wound sites: Axilla, groin, Buttocks, thigh (Active)  Site / Wound Assessment Bleeding;Painful;Red 04/10/2013 11:31 AM  % Wound base Red or Granulating 95% 04/10/2013 11:31 AM  % Wound base Black 5% 04/10/2013 11:31 AM  Peri-wound Assessment Erythema (blanchable) 04/10/2013 11:31 AM  Wound Length (cm) 7.3 cm 04/09/2013 11:29 AM  Wound Width (cm) 5 cm 04/09/2013 11:29 AM  Wound Depth (cm) 1.2 cm 04/09/2013 11:29 AM  Undermining (cm) 1 oclock 4.5 cm, 4-6 oclock 2.0 cm 04/09/2013 11:29 AM  Margins Unattacted edges (unapproximated) 04/10/2013 11:31 AM  Closure None 04/10/2013 11:31 AM  Drainage Amount Moderate 04/10/2013 11:31 AM  Drainage Description Sanguineous 04/10/2013 11:31 AM  Non-staged Wound Description Not applicable 04/10/2013 11:31 AM  Treatment Cleansed;Hydrotherapy (Pulse lavage);Packing (Plain strip);Pressure applied 04/09/2013 11:29 AM  Dressing Type ABD;Gauze (Comment);Moist to dry;Other (Comment) 04/10/2013 11:31 AM  Dressing Changed Changed 04/09/2013 11:29 AM  Dressing Status Clean;Dry;Intact 04/10/2013 11:31 AM     Incision 04/07/13 Groin Left (Active)  Site / Wound Assessment Clean;Dry 04/09/2013  8:58 AM  Margins Unattacted edges (unapproximated) 04/09/2013  8:58 AM  Closure None 04/09/2013  8:58 AM  Drainage Amount None 04/09/2013  8:58 AM  Dressing Type Gauze (Comment);ABD 04/09/2013  8:00 PM  Dressing Dry;Clean;Intact 04/09/2013  8:00 PM     Incision 04/07/13 Buttocks Left (Active)  Site / Wound Assessment Clean;Dry 04/09/2013  8:58 AM  Margins Unattacted edges (unapproximated) 04/09/2013  8:58 AM   Drainage Amount None 04/09/2013  8:58 AM  Dressing Type Gauze (Comment);ABD 04/09/2013  8:00 PM  Dressing Dry;Clean;Intact 04/09/2013  8:00 PM   Hydrotherapy Pulsed lavage therapy - wound location: L hip Pulsed Lavage with Suction (psi): 4 psi Pulsed Lavage with Suction - Normal Saline Used: 1000 mL Pulsed Lavage Tip: Tip with splash shield   Wound Assessment and Plan  Wound Therapy - Assess/Plan/Recommendations Wound Therapy - Clinical Statement: Using PLS hope to cleanse the wound, decr bacterial load, and prep for any selective debridement to promote healing. Wound Therapy - Functional Problem List: immobility Factors Delaying/Impairing Wound Healing: Immobility;Multiple medical problems Hydrotherapy Plan: Debridement;Patient/family education;Pulsatile lavage with suction Wound Therapy - Frequency: 6X / week Wound Therapy - Follow Up Recommendations: Skilled nursing facility Wound Plan: promote healthy wound with PLS to cleanse and selective debridement to prep. for healing,.  Wound Therapy Goals- Improve the function of patient's integumentary system by progressing the wound(s) through the phases of wound healing (inflammation - proliferation - remodeling) by: Decrease Necrotic Tissue to: 0% Decrease Necrotic Tissue - Progress: Progressing toward goal Increase Granulation Tissue to: 100% (including all healthy tissues) Increase Granulation Tissue - Progress: Progressing toward goal Improve Drainage Characteristics: Min;Serous Improve Drainage Characteristics - Progress: Progressing toward goal Time For Goal Achievement: 2 weeks Wound Therapy - Potential for Goals: Good  Goals will be updated until maximal potential achieved or discharge criteria met.  Discharge criteria: when goals achieved, discharge from hospital, MD decision/surgical intervention, no progress towards goals, refusal/missing three consecutive treatments without notification or medical reason.  GP     Natoya Viscomi,  Eliseo Gum 04/10/2013, 11:33 AM 04/10/2013  [place], [place] [phone]  (  pager)

## 2013-04-10 NOTE — Discharge Summary (Signed)
Physician Discharge Summary  Xavier Khan WUJ:[id number] DOB: Aug 29, 1956 DOA: 03/30/2013  PCP: No PCP Per Patient  Admit date: 03/30/2013 Discharge date: 04/10/2013  Time spent: 45 minutes  Recommendations for Outpatient Follow-up:  1. Follow upwith wound care, continue with hydrotherapy as recommended.  2. Follow up with PCP / md at rehabilitation/ [facility] physician in one week 3. Follow up with CBC AND BMP IN 2 days to check wbc count and sodium 4. Follow up with surgery in one week.    Discharge Diagnoses:  Principal Problem:   Cellulitis Active Problems:   CAD (coronary artery disease)   Hidradenitis suppurativa   Diabetes mellitus   Schizophrenia   History of CVA (cerebrovascular accident)   Anemia   Hyperlipidemia   Gout   Dementia   Discharge Condition: improved.   Diet recommendation: regular  Filed Weights   04/08/13 0450 04/09/13 0431 04/10/13 0437  Weight: 57.1 kg (125 lb 14.1 oz) 57.743 kg (127 lb 4.8 oz) 55.7 kg (122 lb 12.7 oz)    History of present illness:   Brief Narrative: Xavier Khan is a 57 y.o. male with known history of hidradenitis suppuritiva, CAD status post stenting, colon cancer status post surgery last year, CVA with left-sided hemiplegia, schizophrenia, cognitive deficits, chronic anemia was found to have increasing bleeding from his groin area where patient has hidranitis suppuritiva. As per patient's sister with whom patient lives patient was being treated with multiple does of antibiotics over the last one month it to increased drainage from the groin area. Patient also has been having slow blood loss from the area. Today patient was noticed to have more than usual blood loss and patient was felt to be weak. On arrival in the ER patient was found to be tachycardic and tachypneic and it improved with fluids. On exam patient had increase drainage from the groin area with blood. On-call surgeon was consulted and at this time patient has been admitted  for worsening of is hidradenitis suppurativa. He was initially started on IV antibiotics and surgery recommended I&D, but patient was refusing the procedure, but his sister wanted to have him get the surgery. CCS has signed off. Later on psychiatry consulted and suggested that he does not have capacity for consenting the proposed treatment or surgery . Surgery was reconsulted and he underwent I&D of the left groin abscess on 5/31. His IV antibiotics were changed to po augmentin and doxycycline and he was started on hydrotherapy on 6/2 and he is being discharged today 6/3.   Hospital Course:   Hidradenitis suppurativa, Acute on Chronic infection - CT pelvis showed Diffuse skin thickening, subjacent fat stranding, and gas within  the superficial soft tissue of the left buttock, most compatible  with decubitus ulcer versus abscess in the absence of trauma.  He received 5 days of IV Vanc/Zosyn, changed to PO augmentin and Doxycycline on 5/28, we will continue with oral antibiotics for 10 days to complete the course.  s/p I&D on 5/31 of the left groin abscess. Cultures sent and pending.  wound care consult noted and recommendations given to continue with hydrotherapy.   2. Anemia - with blood loss from# 1 and hemodilution,  -no GI bleeding noted, Anemia panel showed elevated ferritin, low saturation ratio's and low iron levels.  - as per RN, pt had little bleeding from the hidradenitis , he received one unit of prbc transfusion and his H&h improved to 9.  3. History of CVA with left-sided hemiplegia - continue plavix and  aspirin on discharge, which have been held of bleeding from the abscess .   4. Diabetes mellitus type 2 - with hypoglycemic episodes -stopped oral hypoglycemics, SSI. He is no lonnger hypoglcemic episodes.  CBG (last 3)  His hgba1c is 4.8.  Recent Labs   04/09/13 0803  04/09/13 1135  04/09/13 1626   GLUCAP  182*  280*  349*    5. History of colon cancer status post resection last  year. 6. Schizophrenia - continue cogentin, seroquel and risperdal. Psychiatry consulted appreciate recommendations.  8. Hyperlipidemia - continue statin  9. Gout - continue allopurinol.  10. Constipation;resolved.  11. Mild hyponatremia: not responding to fluids. 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 Recommend checking bmp in 1 to 2 days.    Procedures:  I&D on 5/31 of the left groin abscess.   Consultations:  Surgery  Wound care.    Antibiotics:  Vanc 5/23 to 5/27  Zosyn 5/23 to 5/27  augmentin 5/28 for a total of 10 days Doxycycline.5/28 for a total of 10 days.   Discharge Exam: Filed Vitals:   04/09/13 0431 04/09/13 1416 04/09/13 2158 04/10/13 0437  BP: 112/64 112/71 103/55 97/69  Pulse: 104 105 97 102  Temp: 98.2 F (36.8 C) 98.9 F (37.2 C) 98.2 F (36.8 C) 98.1 F (36.7 C)  TempSrc: Oral Oral Oral Oral  Resp: 20 16 19 17   Height:      Weight: 57.743 kg (127 lb 4.8 oz)   55.7 kg (122 lb 12.7 oz)  SpO2: 99% 99% 100% 100%   General: Alert, awake, Oriented to self and person  Cardiovascular: S1S2/RRR  Respiratory: CTAB, diminished at bases  Abdomen: soft, NT, BS present Perineum: area packed with dressings, some drainage.     Discharge Instructions  Discharge Orders   Future Orders Complete By Expires     Discharge instructions  As directed     Comments:      Follow up with surgery as recommended Follow up with hydrotherapy every day Please obtain CBC and BMP in 2 days to check sodium and wbc count.        Medication List    STOP taking these medications       glipiZIDE 10 MG 24 hr tablet  Commonly known as:  GLUCOTROL XL     potassium chloride SA 20 MEQ tablet  Commonly known as:  K-DUR,KLOR-CON      TAKE these medications       acetaminophen 650 MG CR tablet  Commonly known as:  TYLENOL  Take 650 mg by mouth every 8 (eight) hours as needed for pain.     allopurinol 300 MG tablet  Commonly known as:  ZYLOPRIM  Take 300 mg by mouth daily.      amoxicillin-clavulanate 500-125 MG per tablet  Commonly known as:  AUGMENTIN  Take 1 tablet (500 mg total) by mouth 2 (two) times daily with a meal.     aspirin EC 81 MG tablet  Take 81 mg by mouth daily.     atorvastatin 80 MG tablet  Commonly known as:  LIPITOR  Take 80 mg by mouth daily.     benztropine 1 MG tablet  Commonly known as:  COGENTIN  Take 1 mg by mouth daily.     camphor-menthol lotion  Commonly known as:  SARNA  Apply 1 application topically daily as needed for itching.     clindamycin 1 % external solution  Commonly known as:  CLEOCIN T  Apply 1 application topically  2 (two) times daily as needed (for hidradenitis).     clopidogrel 75 MG tablet  Commonly known as:  PLAVIX  Take 75 mg by mouth daily.     doxycycline 100 MG tablet  Commonly known as:  VIBRA-TABS  Take 1 tablet (100 mg total) by mouth every 12 (twelve) hours.     Ensure Plus Liqd  Take 237 mLs by mouth 2 (two) times daily between meals.     ferrous sulfate 325 (65 FE) MG tablet  Take 325 mg by mouth 2 (two) times daily.     gabapentin 300 MG capsule  Commonly known as:  NEURONTIN  Take 600 mg by mouth at bedtime.     magnesium oxide 400 MG tablet  Commonly known as:  MAG-OX  Take 400 mg by mouth 3 (three) times daily.     mirtazapine 15 MG tablet  Commonly known as:  REMERON  Take 15 mg by mouth at bedtime.     multivitamin with minerals Tabs  Take 1 tablet by mouth daily.     polyethylene glycol packet  Commonly known as:  MIRALAX / GLYCOLAX  Take 17 g by mouth daily.     QUEtiapine 200 MG tablet  Commonly known as:  SEROQUEL  Take 200 mg by mouth at bedtime.     risperiDONE 3 MG tablet  Commonly known as:  RISPERDAL  Take 3 mg by mouth 2 (two) times daily.     triamcinolone cream 0.1 %  Commonly known as:  KENALOG  Apply 1 application topically 2 (two) times daily as needed (for dermatitis).       Allergies  Allergen Reactions  . Asa (Aspirin)       The  results of significant diagnostics from this hospitalization (including imaging, microbiology, ancillary and laboratory) are listed below for reference.    Significant Diagnostic Studies: Ct Abdomen Pelvis W Contrast  03/31/2013   *RADIOLOGY REPORT*  Clinical Data: Rectal bleeding, history of colon cancer  CT ABDOMEN AND PELVIS WITH CONTRAST  Technique:  Multidetector CT imaging of the abdomen and pelvis was performed following the standard protocol during bolus administration of intravenous contrast.  Contrast: OMNIPAQUE IOHEXOL 300 MG/ML  SOLN  Comparison: No similar prior study is available for comparison.  Findings: Trace right pleural effusion noted.  Coronary arterial stent or calcification noted.  Lung bases are otherwise clear with the exception of minimal subpleural presumed atelectasis.  Gallstones noted without other CT evidence for acute cholecystitis. Liver, right kidney, adrenal glands, spleen, and pancreas are normal.  Too small to characterize sub centimeter left renal cortical hypodensities are noted, statistically most likely cysts. Moderate atheromatous aortic calcification without aneurysm.  There is diffuse skin thickening and subjacent fat stranding involving the left buttock which is incompletely imaged.  There are areas of subcutaneous gas within this area of the, for example image 87.  No bowel wall thickening or focal segmental dilatation is identified.  Lumbar spine degenerative change is noted.  IMPRESSION: Diffuse skin thickening, subjacent fat stranding, and gas within the superficial soft tissue of the left buttock, most compatible with decubitus ulcer versus abscess in the absence of trauma.   Original Report Authenticated By: Christiana Pellant, M.D.   Dg Chest Port 1 View  03/30/2013   *RADIOLOGY REPORT*  Clinical Data: Cough and weakness  PORTABLE CHEST - 1 VIEW  Comparison: None.  Findings: The patient is rotated slightly to the left.  Ill-defined left apical airspace  opacity overlying the  left upper lobe is noted.  Heart size is normal.  The right lung is grossly clear. Lung volumes are low with crowding of the bronchovascular markings. No pleural effusion.  No acute osseous abnormality.  Deformity of the right proximal humerus may indicate Hill-Sachs deformity.  The left proximal humerus is not well visualized but there is cortical irregularity which could indicate degenerative change or fracture but is not further evaluated.  IMPRESSION: Ill-defined left upper lobe airspace opacity which could be artifactual related to rotation and superimposed structures but further evaluation with dedicated PA and lateral chest radiograph is recommended for better visualization.  Cortical irregularity at the left proximal humerus, question degenerative change or age indeterminate fracture.  Correlate for point tenderness and consider dedicated imaging of this area.   Original Report Authenticated By: Christiana Pellant, M.D.    Microbiology: Recent Results (from the past 240 hour(s))  CULTURE, ROUTINE-ABSCESS     Status: None   Collection Time    04/07/13 11:14 AM      Result Value Range Status   Specimen Description ABSCESS LEFT GROIN   Final   Special Requests PATIENT ON FOLLOWING AUGMENTIN   Final   Gram Stain     Final   Value: NO WBC SEEN     NO SQUAMOUS EPITHELIAL CELLS SEEN     NO ORGANISMS SEEN   Culture NO GROWTH 3 DAYS   Final   Report Status 04/10/2013 FINAL   Final  ANAEROBIC CULTURE     Status: None   Collection Time    04/07/13 11:14 AM      Result Value Range Status   Specimen Description ABSCESS LEFT GROIN   Final   Special Requests PATIENT ON FOLLOWING AUGMENTIN   Final   Gram Stain PENDING   Incomplete   Culture     Final   Value: NO ANAEROBES ISOLATED; CULTURE IN PROGRESS FOR 5 DAYS   Report Status PENDING   Incomplete     Labs: Basic Metabolic Panel:  Recent Labs Lab 04/03/13 1748 04/04/13 0350 04/08/13 0505  NA 129* 132* 130*  K 4.7 4.1  4.0  CL 94* 97 94*  CO2 24 29 26   GLUCOSE 211* 133* 124*  BUN 7 7 9   CREATININE 0.52 0.52 0.51  CALCIUM 8.8 9.0 9.0   Liver Function Tests:  Recent Labs Lab 04/03/13 1748  AST 14  ALT 14  ALKPHOS 191*  BILITOT 0.4  PROT 9.8*  ALBUMIN 1.6*   No results found for this basename: LIPASE, AMYLASE,  in the last 168 hours No results found for this basename: AMMONIA,  in the last 168 hours CBC:  Recent Labs Lab 04/03/13 1748 04/04/13 0350 04/05/13 0850 04/05/13 2056 04/06/13 0445 04/08/13 0505  WBC 12.4* 11.0* 14.4*  --   --  11.6*  HGB 8.7* 8.4* 8.3* 8.2* 9.2* 8.9*  HCT 26.5* 25.9* 24.9* 24.9* 27.7* 27.6*  MCV 92.7 91.8 92.6  --   --  93.6  PLT 482* 448* 438*  --   --  390   Cardiac Enzymes: No results found for this basename: CKTOTAL, CKMB, CKMBINDEX, TROPONINI,  in the last 168 hours BNP: BNP (last 3 results) No results found for this basename: PROBNP,  in the last 8760 hours CBG:  Recent Labs Lab 04/09/13 1626 04/09/13 2018 04/10/13 0053 04/10/13 0443 04/10/13 0814  GLUCAP 349* 273* 169* 135* 174*       Signed:  Samhitha Rosen  [facility] 04/10/2013, 9:07 AM

## 2013-04-11 ENCOUNTER — Telehealth (INDEPENDENT_AMBULATORY_CARE_PROVIDER_SITE_OTHER): Payer: Self-pay | Admitting: *Deleted

## 2013-04-11 LAB — GLUCOSE, CAPILLARY: Glucose-Capillary: 182 mg/dL — ABNORMAL HIGH (ref 70–99)

## 2013-04-11 NOTE — Clinical Social Work Note (Signed)
CSW informed this morning by [facility] that [facility] reports unable to provide Hydrotherapy. CSW spoke with Lillia Abed with [facility] program [phone] (who is providing pt's care/benefits at [facility]) who reports [facility] MD/PA believes hydrotherapy not needed.  CSW requested [facility] MD/PA speak with Dr. Blake Divine to determine appropriate wound care and provided Dr. Darci Needle pager number [phone]. Lillia Abed reports if Dr. Blake Divine and [facility] MD/PA determine hydrotherapy is needed they may need to move pt to new SNF, possible [facility]. CSW updated pt's sister/POA, [name] [phone], of situation. Pt's sister reports she was unaware [facility] would take pt from [facility] to their [facility] facility for eval prior to transporting him to SNF ([facility]). [facility] provided wheelchair Zenaida Niece transport at d/c yesterday 04/10/13 and were to call pt's sister with transfer update, per Lillia Abed. CSW will continue to update pt's sister.  Dellie Burns, MSW, LCSWA [phone] (Coverage)

## 2013-04-11 NOTE — Progress Notes (Signed)
Pt's packet was not picked up by facility. CSW called to inform facility. CSW confirmed that facility will return to pick up packet and to leave at nurses' station. IV and tele removed. Pt left via W/C accompanied by facility transport.

## 2013-04-11 NOTE — Telephone Encounter (Signed)
Jamie Kato called back to report that she just received a call from a hospitalist to state that Dr. Luisa Hart wants patient to have the hydrotherapy for 1 week.  This RN paged and spoke to Dr. Luisa Hart who stated that he approves the wet-dry dressings and they can discontinue the order for the hydrotherapy.  Again spoke with Keita and explained to move forward with the wet-dry dressing changes daily.

## 2013-04-11 NOTE — Telephone Encounter (Signed)
Jamie Kato with [facility] called to state that they saw the order for the Hydrotherapy however their facility is not able to perform this order.  She has called to ask if another order can be given by Clance Boll PA.  Marisue Ivan PA paged at this time.  Marisue Ivan PA returned call at this time and stated to cancel the hydrotherapy and just perform wet-dry dressing changes daily.  Called and spoke with Jamie Kato again and give new order.  Which she stated understanding at this time.

## 2013-04-12 ENCOUNTER — Emergency Department: Payer: Self-pay | Admitting: Emergency Medicine

## 2013-04-13 ENCOUNTER — Encounter (HOSPITAL_COMMUNITY): Payer: Self-pay | Admitting: *Deleted

## 2013-04-13 ENCOUNTER — Inpatient Hospital Stay (HOSPITAL_COMMUNITY): Payer: Medicare (Managed Care)

## 2013-04-13 ENCOUNTER — Inpatient Hospital Stay (HOSPITAL_COMMUNITY)
Admission: AD | Admit: 2013-04-13 | Discharge: 2013-04-20 | DRG: 871 | Disposition: A | Discharge status: Other Acute Inpatient Hospital | Payer: Medicare (Managed Care) | Source: Other Acute Inpatient Hospital | Attending: Internal Medicine | Admitting: Internal Medicine

## 2013-04-13 DIAGNOSIS — F0390 Unspecified dementia without behavioral disturbance: Secondary | ICD-10-CM

## 2013-04-13 DIAGNOSIS — L732 Hidradenitis suppurativa: Secondary | ICD-10-CM

## 2013-04-13 DIAGNOSIS — Z85038 Personal history of other malignant neoplasm of large intestine: Secondary | ICD-10-CM

## 2013-04-13 DIAGNOSIS — E118 Type 2 diabetes mellitus with unspecified complications: Secondary | ICD-10-CM | POA: Diagnosis present

## 2013-04-13 DIAGNOSIS — A419 Sepsis, unspecified organism: Secondary | ICD-10-CM

## 2013-04-13 DIAGNOSIS — E440 Moderate protein-calorie malnutrition: Secondary | ICD-10-CM

## 2013-04-13 DIAGNOSIS — D649 Anemia, unspecified: Secondary | ICD-10-CM

## 2013-04-13 DIAGNOSIS — K5900 Constipation, unspecified: Secondary | ICD-10-CM

## 2013-04-13 DIAGNOSIS — L0291 Cutaneous abscess, unspecified: Secondary | ICD-10-CM

## 2013-04-13 DIAGNOSIS — M109 Gout, unspecified: Secondary | ICD-10-CM

## 2013-04-13 DIAGNOSIS — Z8673 Personal history of transient ischemic attack (TIA), and cerebral infarction without residual deficits: Secondary | ICD-10-CM

## 2013-04-13 DIAGNOSIS — A4189 Other specified sepsis: Principal | ICD-10-CM | POA: Diagnosis present

## 2013-04-13 DIAGNOSIS — Z66 Do not resuscitate: Secondary | ICD-10-CM | POA: Diagnosis present

## 2013-04-13 DIAGNOSIS — L8993 Pressure ulcer of unspecified site, stage 3: Secondary | ICD-10-CM | POA: Diagnosis present

## 2013-04-13 DIAGNOSIS — L89309 Pressure ulcer of unspecified buttock, unspecified stage: Secondary | ICD-10-CM | POA: Diagnosis present

## 2013-04-13 DIAGNOSIS — I959 Hypotension, unspecified: Secondary | ICD-10-CM

## 2013-04-13 DIAGNOSIS — I69959 Hemiplegia and hemiparesis following unspecified cerebrovascular disease affecting unspecified side: Secondary | ICD-10-CM

## 2013-04-13 DIAGNOSIS — I2510 Atherosclerotic heart disease of native coronary artery without angina pectoris: Secondary | ICD-10-CM

## 2013-04-13 DIAGNOSIS — N498 Inflammatory disorders of other specified male genital organs: Secondary | ICD-10-CM | POA: Diagnosis present

## 2013-04-13 DIAGNOSIS — L0390 Cellulitis, unspecified: Secondary | ICD-10-CM

## 2013-04-13 DIAGNOSIS — F0391 Unspecified dementia with behavioral disturbance: Secondary | ICD-10-CM

## 2013-04-13 DIAGNOSIS — Y846 Urinary catheterization as the cause of abnormal reaction of the patient, or of later complication, without mention of misadventure at the time of the procedure: Secondary | ICD-10-CM | POA: Diagnosis present

## 2013-04-13 DIAGNOSIS — G2402 Drug induced acute dystonia: Secondary | ICD-10-CM | POA: Diagnosis present

## 2013-04-13 DIAGNOSIS — E785 Hyperlipidemia, unspecified: Secondary | ICD-10-CM

## 2013-04-13 DIAGNOSIS — IMO0002 Reserved for concepts with insufficient information to code with codable children: Secondary | ICD-10-CM | POA: Diagnosis present

## 2013-04-13 DIAGNOSIS — E119 Type 2 diabetes mellitus without complications: Secondary | ICD-10-CM

## 2013-04-13 DIAGNOSIS — F209 Schizophrenia, unspecified: Secondary | ICD-10-CM

## 2013-04-13 DIAGNOSIS — R1314 Dysphagia, pharyngoesophageal phase: Secondary | ICD-10-CM

## 2013-04-13 HISTORY — DX: Mental disorder, not otherwise specified: F99

## 2013-04-13 HISTORY — DX: Type 2 diabetes mellitus without complications: E11.9

## 2013-04-13 LAB — GLUCOSE, CAPILLARY: Glucose-Capillary: 192 mg/dL — ABNORMAL HIGH (ref 70–99)

## 2013-04-13 LAB — CBC WITH DIFFERENTIAL/PLATELET
Eosinophils Absolute: 0.2 10*3/uL (ref 0.0–0.7)
Eosinophils Relative: 2 % (ref 0–5)
HCT: 26.2 % — ABNORMAL LOW (ref 39.0–52.0)
Hemoglobin: 8.6 g/dL — ABNORMAL LOW (ref 13.0–17.0)
Lymphocytes Relative: 5 % — ABNORMAL LOW (ref 12–46)
Lymphs Abs: 0.9 10*3/uL (ref 0.7–4.0)
MCH: 30.2 pg (ref 26.0–34.0)
MCV: 91.9 fL (ref 78.0–100.0)
Monocytes Absolute: 1 10*3/uL (ref 0.1–1.0)
Monocytes Relative: 6 % (ref 3–12)
RBC: 2.85 MIL/uL — ABNORMAL LOW (ref 4.22–5.81)
WBC: 16.5 10*3/uL — ABNORMAL HIGH (ref 4.0–10.5)

## 2013-04-13 LAB — COMPREHENSIVE METABOLIC PANEL
ALT: 29 U/L (ref 0–53)
BUN: 9 mg/dL (ref 6–23)
CO2: 23 mEq/L (ref 19–32)
Calcium: 8.8 mg/dL (ref 8.4–10.5)
GFR calc Af Amer: >90 mL/min (ref >90)
GFR calc non Af Amer: >90 mL/min (ref >90)
Glucose, Bld: 213 mg/dL — ABNORMAL HIGH (ref 70–99)
Total Protein: 10.1 g/dL — ABNORMAL HIGH (ref 6.0–8.3)

## 2013-04-13 LAB — ANAEROBIC CULTURE: Gram Stain: NONE SEEN

## 2013-04-13 LAB — PROTIME-INR: INR: 1.25 (ref 0.00–1.49)

## 2013-04-13 LAB — BASIC METABOLIC PANEL
BUN: 9 mg/dL (ref 6–23)
Creatinine, Ser: 0.46 mg/dL — ABNORMAL LOW (ref 0.50–1.35)
GFR calc Af Amer: >90 mL/min (ref >90)
GFR calc non Af Amer: >90 mL/min (ref >90)

## 2013-04-13 LAB — LACTIC ACID, PLASMA: Lactic Acid, Venous: 1 mmol/L (ref 0.5–2.2)

## 2013-04-13 MED ORDER — INSULIN ASPART 100 UNIT/ML IV SOLN
10.0000 [IU] | Freq: Once | INTRAVENOUS | Status: AC
  Administered 2013-04-13: 10 [IU] via INTRAVENOUS

## 2013-04-13 MED ORDER — SODIUM CHLORIDE 0.9 % IJ SOLN
3.0000 mL | Freq: Two times a day (BID) | INTRAMUSCULAR | Status: DC
  Administered 2013-04-13 – 2013-04-20 (×11): 3 mL via INTRAVENOUS

## 2013-04-13 MED ORDER — PIPERACILLIN-TAZOBACTAM 3.375 G IVPB
3.3750 g | Freq: Three times a day (TID) | INTRAVENOUS | Status: DC
  Administered 2013-04-13 – 2013-04-19 (×19): 3.375 g via INTRAVENOUS
  Filled 2013-04-13 (×21): qty 50

## 2013-04-13 MED ORDER — ATORVASTATIN CALCIUM 80 MG PO TABS
80.0000 mg | ORAL_TABLET | Freq: Every day | ORAL | Status: DC
  Administered 2013-04-13 – 2013-04-20 (×7): 80 mg via ORAL
  Filled 2013-04-13 (×8): qty 1

## 2013-04-13 MED ORDER — ADULT MULTIVITAMIN W/MINERALS CH
1.0000 | ORAL_TABLET | Freq: Every day | ORAL | Status: DC
  Administered 2013-04-13 – 2013-04-20 (×7): 1 via ORAL
  Filled 2013-04-13 (×8): qty 1

## 2013-04-13 MED ORDER — RISPERIDONE 3 MG PO TABS
3.0000 mg | ORAL_TABLET | Freq: Two times a day (BID) | ORAL | Status: DC
  Administered 2013-04-13 – 2013-04-20 (×15): 3 mg via ORAL
  Filled 2013-04-13 (×17): qty 1

## 2013-04-13 MED ORDER — MIRTAZAPINE 15 MG PO TABS
15.0000 mg | ORAL_TABLET | Freq: Every day | ORAL | Status: DC
  Administered 2013-04-13 – 2013-04-19 (×7): 15 mg via ORAL
  Filled 2013-04-13 (×8): qty 1

## 2013-04-13 MED ORDER — VANCOMYCIN HCL 10 G IV SOLR
1500.0000 mg | INTRAVENOUS | Status: DC
  Administered 2013-04-13: 1500 mg via INTRAVENOUS
  Filled 2013-04-13 (×3): qty 1500

## 2013-04-13 MED ORDER — INSULIN ASPART 100 UNIT/ML ~~LOC~~ SOLN
0.0000 [IU] | Freq: Three times a day (TID) | SUBCUTANEOUS | Status: DC
  Administered 2013-04-13: 3 [IU] via SUBCUTANEOUS
  Administered 2013-04-13 – 2013-04-14 (×2): 8 [IU] via SUBCUTANEOUS
  Administered 2013-04-14: 2 [IU] via SUBCUTANEOUS
  Administered 2013-04-14: 8 [IU] via SUBCUTANEOUS
  Administered 2013-04-15: 11 [IU] via SUBCUTANEOUS
  Administered 2013-04-15: 5 [IU] via SUBCUTANEOUS
  Administered 2013-04-16: 3 [IU] via SUBCUTANEOUS
  Administered 2013-04-16: 2 [IU] via SUBCUTANEOUS
  Administered 2013-04-16: 11 [IU] via SUBCUTANEOUS
  Administered 2013-04-17: 5 [IU] via SUBCUTANEOUS
  Administered 2013-04-18: 3 [IU] via SUBCUTANEOUS
  Administered 2013-04-18: 2 [IU] via SUBCUTANEOUS
  Administered 2013-04-18: 5 [IU] via SUBCUTANEOUS
  Administered 2013-04-19: 3 [IU] via SUBCUTANEOUS
  Administered 2013-04-19 – 2013-04-20 (×2): 11 [IU] via SUBCUTANEOUS
  Administered 2013-04-20: 5 [IU] via SUBCUTANEOUS
  Administered 2013-04-20: 2 [IU] via SUBCUTANEOUS

## 2013-04-13 MED ORDER — SODIUM POLYSTYRENE SULFONATE 15 GM/60ML PO SUSP
30.0000 g | Freq: Once | ORAL | Status: AC
  Administered 2013-04-13: 30 g via ORAL
  Filled 2013-04-13: qty 120

## 2013-04-13 MED ORDER — ASPIRIN EC 81 MG PO TBEC
81.0000 mg | DELAYED_RELEASE_TABLET | Freq: Every day | ORAL | Status: DC
  Administered 2013-04-13 – 2013-04-15 (×3): 81 mg via ORAL
  Filled 2013-04-13 (×3): qty 1

## 2013-04-13 MED ORDER — ALLOPURINOL 300 MG PO TABS
300.0000 mg | ORAL_TABLET | Freq: Every day | ORAL | Status: DC
  Administered 2013-04-13 – 2013-04-20 (×8): 300 mg via ORAL
  Filled 2013-04-13 (×8): qty 1

## 2013-04-13 MED ORDER — CLOPIDOGREL BISULFATE 75 MG PO TABS
75.0000 mg | ORAL_TABLET | Freq: Every day | ORAL | Status: DC
  Administered 2013-04-13 – 2013-04-20 (×8): 75 mg via ORAL
  Filled 2013-04-13 (×10): qty 1

## 2013-04-13 MED ORDER — ONDANSETRON HCL 4 MG/2ML IJ SOLN: 4.0000 mg | Freq: Four times a day (QID) | INTRAMUSCULAR | Status: DC | PRN

## 2013-04-13 MED ORDER — POLYETHYLENE GLYCOL 3350 17 G PO PACK
17.0000 g | PACK | Freq: Every day | ORAL | Status: DC
  Administered 2013-04-13 – 2013-04-20 (×7): 17 g via ORAL
  Filled 2013-04-13 (×8): qty 1

## 2013-04-13 MED ORDER — DEXTROSE 50 % IV SOLN
INTRAVENOUS | Status: AC
  Administered 2013-04-13: 50 mL via INTRAVENOUS
  Filled 2013-04-13: qty 50

## 2013-04-13 MED ORDER — SODIUM CHLORIDE 0.9 % IV SOLN
INTRAVENOUS | Status: AC
  Administered 2013-04-13: 05:00:00 via INTRAVENOUS

## 2013-04-13 MED ORDER — ACETAMINOPHEN 325 MG PO TABS
650.0000 mg | ORAL_TABLET | Freq: Four times a day (QID) | ORAL | Status: DC | PRN
  Administered 2013-04-15: 650 mg via ORAL
  Filled 2013-04-13: qty 2

## 2013-04-13 MED ORDER — ONDANSETRON HCL 4 MG PO TABS: 4.0000 mg | ORAL_TABLET | Freq: Four times a day (QID) | ORAL | Status: DC | PRN

## 2013-04-13 MED ORDER — DEXTROSE 250 MG/ML IV SOLN
50.0000 mg | Freq: Once | INTRAVENOUS | Status: AC
  Administered 2013-04-13: 50 mg via INTRAVENOUS
  Filled 2013-04-13: qty 10

## 2013-04-13 MED ORDER — QUETIAPINE FUMARATE 200 MG PO TABS
200.0000 mg | ORAL_TABLET | Freq: Every day | ORAL | Status: DC
  Administered 2013-04-13 – 2013-04-19 (×7): 200 mg via ORAL
  Filled 2013-04-13 (×9): qty 1

## 2013-04-13 MED ORDER — ACETAMINOPHEN 650 MG RE SUPP
650.0000 mg | Freq: Four times a day (QID) | RECTAL | Status: DC | PRN
  Filled 2013-04-13: qty 1

## 2013-04-13 MED ORDER — BENZTROPINE MESYLATE 1 MG PO TABS
1.0000 mg | ORAL_TABLET | Freq: Every day | ORAL | Status: DC
  Filled 2013-04-13: qty 1

## 2013-04-13 MED ORDER — ENSURE COMPLETE PO LIQD
237.0000 mL | Freq: Two times a day (BID) | ORAL | Status: DC
  Administered 2013-04-14 – 2013-04-18 (×9): 237 mL via ORAL
  Filled 2013-04-13 (×12): qty 237

## 2013-04-13 MED ORDER — PIPERACILLIN-TAZOBACTAM 3.375 G IVPB 30 MIN
3.3750 g | Freq: Once | INTRAVENOUS | Status: DC
  Filled 2013-04-13: qty 50

## 2013-04-13 MED ORDER — MAGNESIUM OXIDE 400 (241.3 MG) MG PO TABS
400.0000 mg | ORAL_TABLET | Freq: Three times a day (TID) | ORAL | Status: DC
  Administered 2013-04-13 – 2013-04-20 (×23): 400 mg via ORAL
  Filled 2013-04-13 (×24): qty 1

## 2013-04-13 MED ORDER — FERROUS SULFATE 325 (65 FE) MG PO TABS
325.0000 mg | ORAL_TABLET | Freq: Two times a day (BID) | ORAL | Status: DC
  Administered 2013-04-13 – 2013-04-20 (×15): 325 mg via ORAL
  Filled 2013-04-13 (×16): qty 1

## 2013-04-13 MED ORDER — GABAPENTIN 300 MG PO CAPS
600.0000 mg | ORAL_CAPSULE | Freq: Every day | ORAL | Status: DC
  Administered 2013-04-13 – 2013-04-19 (×7): 600 mg via ORAL
  Filled 2013-04-13 (×8): qty 2

## 2013-04-13 NOTE — Consult Note (Signed)
Urology Consult  Referring physician:  Dr. Luisa Hart                                    Dr. Toniann Fail Reason for referral:   Scrotal infection  Chief Complaint:   Scrotal infection  History of Present Illness:   HPI: Xavier Khan is a 57 y.o. Diabetic male with known history of hidranitis suppuritiva, CAD status post stenting, colon cancer status post surgery last year, CVA with left-sided hemiplegia, schizophrenia, chronic anemia was found to have increasing bleeding from his groin area where patient has hidranitis suppuritiva. As per patient's sister with whom patient lives patient was being treated with multiple does of antibiotics over the last one month it to increased drainage from the groin area. Patient also has been having slow blood loss from the area. Today patient was noticed to have more than usual blood loss and patient was felt to be weak. On arrival in the ER patient was found to be tachycardic and tachypneic and it improved with fluids. On exam patient had increase drainage from the groin area with blood. On-call surgeon was consulted and at this time patient has been admitted for worsening of is hidradenitis suppurativa. CT abdomen and pelvis neg , except for decubitus ulcer.   Pt now post bilateral axillary I/D, and bilateral groin I/D. Cultures negative to date. Urology consult for thickened scrotum with possible infection. ( sister is POA).        Past Medical History  Diagnosis Date  . Diabetes mellitus with polyneuropathy   . Blood transfusion without reported diagnosis   . Anemia   . Adenocarcinoma, colon     Colon  . Gout   . CAD (coronary artery disease)   . MI, old   . CVA (cerebral infarction)   . Hemiplegia, post-stroke   . Protein calorie malnutrition   . Stable angina   . Suppurative hidradenitis     ch  . Diabetes mellitus without complication   . Mental disorder     schizophrenia   Past Surgical History  Procedure Laterality Date  . Cardiac surgery       Stent 2013  . Coloscop    . Status post hemicolectomy      February 2013 at [facility]  . Wound debridement Left 04/07/2013    Procedure: DEBRIDEMENT WOUND;  Surgeon: Cherylynn Ridges, MD;  Location: [facility] OR;  Service: General;  Laterality: Left;  Left buttocks  . Irrigation and debridement abscess Left 04/07/2013    Procedure: IRRIGATION AND DEBRIDEMENT ABSCESS;  Surgeon: Cherylynn Ridges, MD;  Location: [facility] OR;  Service: General;  Laterality: Left;  Left Groin    Medications: I have reviewed the patient's current medications.  Vancymycin, zosyn  Allergies:  Allergies  Allergen Reactions  . Asa (Aspirin)     Takes baby enteric-coated ASA at home.    History reviewed. No pertinent family history. Social History:  reports that he has been smoking Cigarettes.  He has been smoking about 0.02 packs per day for the past 0 years. He does not have any smokeless tobacco history on file. He reports that he does not drink alcohol or use illicit drugs.  ROS: All systems are reviewed and negative except as noted. Pt is not communicative.   Physical Exam:  Vital signs in last 24 hours: Temp:  [98.2 F (36.8 C)-98.7 F (37.1 C)] 98.2 F (36.8 C) (06/06  1600) Pulse Rate:  [92-108] 96 (06/06 1600) Resp:  [13-30] 22 (06/06 1600) BP: (108-120)/(55-65) 113/64 mmHg (06/06 1600) SpO2:  [99 %-100 %] 99 % (06/06 1600) Weight:  [55.1 kg (121 lb 7.6 oz)] 55.1 kg (121 lb 7.6 oz) (06/06 0300)  Cardiovascular: Skin warm; not flushed Respiratory: Breaths quiet; no shortness of breath Abdomen: No masses Neurological: Normal sensation to touch Musculoskeletal: Normal motor function arms and legs Lymphatics: No inguinal adenopathy Skin: No rashes Genitourinary: L scrotal thickening and weeping area, 2cm x 2 cm. No abscess. No erythema. Non-tender.  Laboratory Data:  Results for orders placed during the hospital encounter of 04/13/13 (from the past 72 hour(s))  MRSA PCR SCREENING     Status: None   Collection  Time    04/13/13  3:29 AM      Result Value Range   MRSA by PCR NEGATIVE  NEGATIVE   Comment:            The GeneXpert MRSA Assay (FDA     approved for NASAL specimens     only), is one component of a     comprehensive MRSA colonization     surveillance program. It is not     intended to diagnose MRSA     infection nor to guide or     monitor treatment for     MRSA infections.  COMPREHENSIVE METABOLIC PANEL     Status: Abnormal   Collection Time    04/13/13  4:20 AM      Result Value Range   Sodium 126 (*) 135 - 145 mEq/L   Potassium 6.0 (*) 3.5 - 5.1 mEq/L   Comment: HEMOLYSIS AT THIS LEVEL MAY AFFECT RESULT   Chloride 92 (*) 96 - 112 mEq/L   CO2 23  19 - 32 mEq/L   Glucose, Bld 213 (*) 70 - 99 mg/dL   BUN 9  6 - 23 mg/dL   Creatinine, Ser 4.09 (*) 0.50 - 1.35 mg/dL   Calcium 8.8  8.4 - 81.1 mg/dL   Total Protein 91.4 (*) 6.0 - 8.3 g/dL   Albumin 1.7 (*) 3.5 - 5.2 g/dL   AST 44 (*) 0 - 37 U/L   ALT 29  0 - 53 U/L   Comment: HEMOLYSIS AT THIS LEVEL MAY AFFECT RESULT   Alkaline Phosphatase 229 (*) 39 - 117 U/L   Comment: HEMOLYSIS AT THIS LEVEL MAY AFFECT RESULT   Total Bilirubin 0.4  0.3 - 1.2 mg/dL   GFR calc non Af Amer >90  >90 mL/min   GFR calc Af Amer >90  >90 mL/min   Comment:            The eGFR has been calculated     using the CKD EPI equation.     This calculation has not been     validated in all clinical     situations.     eGFR's persistently     <90 mL/min signify     possible Chronic Kidney Disease.  CBC WITH DIFFERENTIAL     Status: Abnormal   Collection Time    04/13/13  7:08 AM      Result Value Range   WBC 16.5 (*) 4.0 - 10.5 K/uL   RBC 2.85 (*) 4.22 - 5.81 MIL/uL   Hemoglobin 8.6 (*) 13.0 - 17.0 g/dL   HCT 78.2 (*) 95.6 - 21.3 %   MCV 91.9  78.0 - 100.0 fL   MCH 30.2  26.0 - 34.0  pg   MCHC 32.8  30.0 - 36.0 g/dL   RDW 30.8  65.7 - 84.6 %   Platelets 453 (*) 150 - 400 K/uL   Neutrophils Relative % 87 (*) 43 - 77 %   Neutro Abs 14.3 (*)  1.7 - 7.7 K/uL   Lymphocytes Relative 5 (*) 12 - 46 %   Lymphs Abs 0.9  0.7 - 4.0 K/uL   Monocytes Relative 6  3 - 12 %   Monocytes Absolute 1.0  0.1 - 1.0 K/uL   Eosinophils Relative 2  0 - 5 %   Eosinophils Absolute 0.2  0.0 - 0.7 K/uL   Basophils Relative 0  0 - 1 %   Basophils Absolute 0.0  0.0 - 0.1 K/uL  LACTIC ACID, PLASMA     Status: None   Collection Time    04/13/13  7:08 AM      Result Value Range   Lactic Acid, Venous 1.0  0.5 - 2.2 mmol/L  PROTIME-INR     Status: Abnormal   Collection Time    04/13/13  7:08 AM      Result Value Range   Prothrombin Time 15.5 (*) 11.6 - 15.2 seconds   INR 1.25  0.00 - 1.49  GLUCOSE, CAPILLARY     Status: Abnormal   Collection Time    04/13/13  8:30 AM      Result Value Range   Glucose-Capillary 238 (*) 70 - 99 mg/dL  GLUCOSE, CAPILLARY     Status: Abnormal   Collection Time    04/13/13 12:16 PM      Result Value Range   Glucose-Capillary 296 (*) 70 - 99 mg/dL  BASIC METABOLIC PANEL     Status: Abnormal   Collection Time    04/13/13  4:15 PM      Result Value Range   Sodium 133 (*) 135 - 145 mEq/L   Comment: DELTA CHECK NOTED   Potassium 3.7  3.5 - 5.1 mEq/L   Comment: DELTA CHECK NOTED   Chloride 96  96 - 112 mEq/L   CO2 30  19 - 32 mEq/L   Glucose, Bld 240 (*) 70 - 99 mg/dL   BUN 9  6 - 23 mg/dL   Creatinine, Ser 9.62 (*) 0.50 - 1.35 mg/dL   Calcium 8.4  8.4 - 95.2 mg/dL   GFR calc non Af Amer >90  >90 mL/min   GFR calc Af Amer >90  >90 mL/min   Comment:            The eGFR has been calculated     using the CKD EPI equation.     This calculation has not been     validated in all clinical     situations.     eGFR's persistently     <90 mL/min signify     possible Chronic Kidney Disease.  GLUCOSE, CAPILLARY     Status: Abnormal   Collection Time    04/13/13  5:02 PM      Result Value Range   Glucose-Capillary 192 (*) 70 - 99 mg/dL   Recent Results (from the past 240 hour(s))  CULTURE, ROUTINE-ABSCESS     Status:  None   Collection Time    04/07/13 11:14 AM      Result Value Range Status   Specimen Description ABSCESS LEFT GROIN   Final   Special Requests PATIENT ON FOLLOWING AUGMENTIN   Final   Gram Stain     Final  Value: NO WBC SEEN     NO SQUAMOUS EPITHELIAL CELLS SEEN     NO ORGANISMS SEEN   Culture NO GROWTH 3 DAYS   Final   Report Status 04/10/2013 FINAL   Final  ANAEROBIC CULTURE     Status: None   Collection Time    04/07/13 11:14 AM      Result Value Range Status   Specimen Description ABSCESS LEFT GROIN   Final   Special Requests PATIENT ON FOLLOWING AUGMENTIN   Final   Gram Stain     Final   Value: NO WBC SEEN     NO SQUAMOUS EPITHELIAL CELLS SEEN     NO ORGANISMS SEEN   Culture MULTIPLE ORGANISMS PRESENT, NONE PREDOMINANT   Final   Report Status 04/13/2013 FINAL   Final  MRSA PCR SCREENING     Status: None   Collection Time    04/13/13  3:29 AM      Result Value Range Status   MRSA by PCR NEGATIVE  NEGATIVE Final   Comment:            The GeneXpert MRSA Assay (FDA     approved for NASAL specimens     only), is one component of a     comprehensive MRSA colonization     surveillance program. It is not     intended to diagnose MRSA     infection nor to guide or     monitor treatment for     MRSA infections.   Creatinine:  Recent Labs  04/08/13 0505 04/13/13 0420 04/13/13 1615  CREATININE 0.51 0.40* 0.46*    Xrays: See report/chart CT: neg for abscess.  Impression/Assessment:   Hidranitis of the scrotum.   Plan:  Wet to dry dressings. Will follow with you.   Jethro Bolus I 04/13/2013, 6:32 PM

## 2013-04-13 NOTE — Progress Notes (Signed)
INITIAL NUTRITION ASSESSMENT  DOCUMENTATION CODES Per approved criteria  -Non-severe (moderate) malnutrition in the context of acute illness or injury   INTERVENTION: 1.  Supplements; Ensure Complete po TID, each supplement provides 350 kcal and 13 grams of protein. 2.  Modify diet; recommend diet liberalization to Regular.    NUTRITION DIAGNOSIS: Increased nutrient needs related to wound healing as evidenced by groin wounds, recent surgery.   Monitor:  1.  Food/Beverage; improvement in intake sufficient to meet >/=90% estimated needs.  Reason for Assessment: MST  57 y.o. male  Admitting Dx: Cellulitis  ASSESSMENT: Pt admitted with worsening drainage from groin wound.  Pt with h/o Hidradenitis suppurativa.  RD met with pt and family at bedside.  Sister is feeding pt and reports "I can get him to eat real good."  She reports pt's wt was recently up to 129 lbs "with my help."  Pt now weighing 121 lbs where he has recently been stable.  PO 50-75%.  Nutrition Focused Physical Exam:  Subcutaneous Fat:  Orbital Region: wnl Upper Arm Region: wnl Thoracic and Lumbar Region: moderate  Muscle:  Temple Region: severe Clavicle Bone Region: moderate-severe Clavicle and Acromion Bone Region: wnl Scapular Bone Region: n/a Dorsal Hand: n/a Patellar Region: n/a Anterior Thigh Region: n/a Posterior Calf Region: n/a  Edema: none noted  Pt reports poor intake PTA.  Pt requires max encouragement and sometimes assistance to feed.  Sister is feeding pt at time of visit.  She also reports pt with some difficulty swallowing periodically.  Pt is eating oatmeal and drinking juice and time of visit without distress.  Recommend SLP eval if appropriate.  Pt qualifies for moderate malnutrition of acute illness due to severe muscle wasting as well as poor PO intake meeting <75% of estimated needs based on sister's report due to recent surgery.   Height: Ht Readings from Last 1 Encounters:   04/13/13 5\' 4"  (1.626 m)    Weight: Wt Readings from Last 1 Encounters:  04/13/13 121 lb 7.6 oz (55.1 kg)    Ideal Body Weight: 120 lbs  % Ideal Body Weight: 101%  Wt Readings from Last 10 Encounters:  04/13/13 121 lb 7.6 oz (55.1 kg)  04/10/13 122 lb 12.7 oz (55.7 kg)  04/10/13 122 lb 12.7 oz (55.7 kg)    Usual Body Weight: variable 120-127 lbs  % Usual Body Weight: 100%  BMI:  Body mass index is 20.84 kg/(m^2).  Estimated Nutritional Needs: Kcal: 1650-1770 Protein: 75-85g Fluid: >1.6 L/day  Skin: wounds to axilla, groin, buttocks, thigh  Diet Order: Cardiac  EDUCATION NEEDS: -Education needs addressed   Intake/Output Summary (Last 24 hours) at 04/13/13 1416 Last data filed at 04/13/13 0600  Gross per 24 hour  Intake    125 ml  Output      0 ml  Net    125 ml    Last BM: PTA  Labs:   Recent Labs Lab 04/08/13 0505 04/13/13 0420  NA 130* 126*  K 4.0 6.0*  CL 94* 92*  CO2 26 23  BUN 9 9  CREATININE 0.51 0.40*  CALCIUM 9.0 8.8  GLUCOSE 124* 213*    CBG (last 3)   Recent Labs  04/13/13 0830 04/13/13 1216  GLUCAP 238* 296*    Scheduled Meds: . allopurinol  300 mg Oral Daily  . aspirin EC  81 mg Oral Daily  . atorvastatin  80 mg Oral Daily  . clopidogrel  75 mg Oral Q breakfast  . feeding supplement  237 mL Oral BID BM  . ferrous sulfate  325 mg Oral BID  . gabapentin  600 mg Oral QHS  . insulin aspart  0-15 Units Subcutaneous TID WC  . magnesium oxide  400 mg Oral TID  . mirtazapine  15 mg Oral QHS  . multivitamin with minerals  1 tablet Oral Daily  . piperacillin-tazobactam (ZOSYN)  IV  3.375 g Intravenous Q8H  . polyethylene glycol  17 g Oral Daily  . QUEtiapine  200 mg Oral QHS  . risperiDONE  3 mg Oral BID  . sodium chloride  3 mL Intravenous Q12H  . vancomycin  1,500 mg Intravenous Q24H    Continuous Infusions: . sodium chloride 100 mL/hr at 04/13/13 0515    Past Medical History  Diagnosis Date  . Diabetes mellitus  with polyneuropathy   . Blood transfusion without reported diagnosis   . Anemia   . Adenocarcinoma, colon     Colon  . Gout   . CAD (coronary artery disease)   . MI, old   . CVA (cerebral infarction)   . Hemiplegia, post-stroke   . Protein calorie malnutrition   . Stable angina   . Suppurative hidradenitis     ch  . Diabetes mellitus without complication   . Mental disorder     schizophrenia    Past Surgical History  Procedure Laterality Date  . Cardiac surgery      Stent 2013  . Coloscop    . Status post hemicolectomy      February 2013 at [facility]  . Wound debridement Left 04/07/2013    Procedure: DEBRIDEMENT WOUND;  Surgeon: Cherylynn Ridges, MD;  Location: [facility] OR;  Service: General;  Laterality: Left;  Left buttocks  . Irrigation and debridement abscess Left 04/07/2013    Procedure: IRRIGATION AND DEBRIDEMENT ABSCESS;  Surgeon: Cherylynn Ridges, MD;  Location: [facility] OR;  Service: General;  Laterality: Left;  Left Groin    Loyce Dys, MS RD LDN Clinical Inpatient Dietitian Pager: [phone] Weekend/After hours pager: [phone]

## 2013-04-13 NOTE — Consult Note (Signed)
WOC consult requested: Pt familiar to WOC team from previous admission. Hidradenitis suppurativa is beyond scope of practice for WOC nursing.  Please consult [facility] for further plan of care.  They were following pt on 5/31 for surgical intervention. Topical treatment of this complex medical problem is minimally effective in promoting healing. Please re-consult if further assistance is needed.  Thank-you,  Cammie Mcgee MSN, RN, CWOCN, [place], CNS [phone]

## 2013-04-13 NOTE — Progress Notes (Signed)
Pt refusing labs even after prompting from nurse. Donnamarie Poag NP notified.

## 2013-04-13 NOTE — Progress Notes (Signed)
ANTIBIOTIC CONSULT NOTE - INITIAL  Pharmacy Consult for Vancocin and Zosyn Indication: cellulitis  Allergies  Allergen Reactions  . Asa (Aspirin)     Takes baby enteric-coated ASA at home.    Patient Measurements: Height: 5\' 4"  (162.6 cm) Weight: 121 lb 7.6 oz (55.1 kg) IBW/kg (Calculated) : 59.2  Vital Signs: Temp: 98.7 F (37.1 C) (06/06 0300) Temp src: Oral (06/06 0300) BP: 120/55 mmHg (06/06 0300) Pulse Rate: 108 (06/06 0300)   Microbiology: Recent Results (from the past 720 hour(s))  CULTURE, BLOOD (ROUTINE X 2)     Status: None   Collection Time    03/30/13  6:00 PM      Result Value Range Status   Specimen Description BLOOD RIGHT WRIST   Final   Special Requests BOTTLES DRAWN AEROBIC ONLY 5CC   Final   Culture  Setup Time 03/31/2013 03:03   Final   Culture NO GROWTH 5 DAYS   Final   Report Status 04/06/2013 FINAL   Final  CULTURE, BLOOD (ROUTINE X 2)     Status: None   Collection Time    03/30/13  7:08 PM      Result Value Range Status   Specimen Description BLOOD HAND RIGHT   Final   Special Requests BOTTLES DRAWN AEROBIC ONLY 3CC   Final   Culture  Setup Time 03/31/2013 03:03   Final   Culture NO GROWTH 5 DAYS   Final   Report Status 04/06/2013 FINAL   Final  URINE CULTURE     Status: None   Collection Time    03/30/13  8:59 PM      Result Value Range Status   Specimen Description URINE, CLEAN CATCH   Final   Special Requests NONE   Final   Culture  Setup Time 03/31/2013 01:50   Final   Colony Count 3,000 COLONIES/ML   Final   Culture INSIGNIFICANT GROWTH   Final   Report Status 04/01/2013 FINAL   Final  GRAM STAIN     Status: None   Collection Time    03/30/13  8:59 PM      Result Value Range Status   Specimen Description URINE, CLEAN CATCH   Final   Special Requests NONE   Final   Gram Stain     Final   Value: CYTOSPIN     Neg for bacteria     WBC PRESENT,BOTH PMN AND MONONUCLEAR Only 2 cells seen on slide     Results Called toRayburn Ma, RN  [name]   Report Status 03/30/2013 FINAL   Final  MRSA PCR SCREENING     Status: None   Collection Time    03/31/13 12:17 AM      Result Value Range Status   MRSA by PCR NEGATIVE  NEGATIVE Final   Comment:            The GeneXpert MRSA Assay (FDA     approved for NASAL specimens     only), is one component of a     comprehensive MRSA colonization     surveillance program. It is not     intended to diagnose MRSA     infection nor to guide or     monitor treatment for     MRSA infections.  CULTURE, ROUTINE-ABSCESS     Status: None   Collection Time    04/07/13 11:14 AM      Result Value Range Status   Specimen Description ABSCESS  LEFT GROIN   Final   Special Requests PATIENT ON FOLLOWING AUGMENTIN   Final   Gram Stain     Final   Value: NO WBC SEEN     NO SQUAMOUS EPITHELIAL CELLS SEEN     NO ORGANISMS SEEN   Culture NO GROWTH 3 DAYS   Final   Report Status 04/10/2013 FINAL   Final  ANAEROBIC CULTURE     Status: None   Collection Time    04/07/13 11:14 AM      Result Value Range Status   Specimen Description ABSCESS LEFT GROIN   Final   Special Requests PATIENT ON FOLLOWING AUGMENTIN   Final   Gram Stain PENDING   Incomplete   Culture     Final   Value: NO ANAEROBES ISOLATED; CULTURE IN PROGRESS FOR 5 DAYS   Report Status PENDING   Incomplete    Medical History: Past Medical History  Diagnosis Date  . Diabetes mellitus with polyneuropathy   . Blood transfusion without reported diagnosis   . Anemia   . Adenocarcinoma, colon     Colon  . Gout   . CAD (coronary artery disease)   . MI, old   . CVA (cerebral infarction)   . Hemiplegia, post-stroke   . Protein calorie malnutrition   . Stable angina   . Suppurative hidradenitis     ch  . Diabetes mellitus without complication   . Mental disorder     schizophrenia    Medications:  Prescriptions prior to admission  Medication Sig Dispense Refill  . acetaminophen (TYLENOL) 650 MG CR tablet Take 650 mg  by mouth every 8 (eight) hours as needed for pain.      Marland Kitchen allopurinol (ZYLOPRIM) 300 MG tablet Take 300 mg by mouth daily.      Marland Kitchen amoxicillin-clavulanate (AUGMENTIN) 500-125 MG per tablet Take 1 tablet (500 mg total) by mouth 2 (two) times daily with a meal.  6 tablet  0  . aspirin EC 81 MG tablet Take 81 mg by mouth daily.      Marland Kitchen atorvastatin (LIPITOR) 80 MG tablet Take 80 mg by mouth daily.      . benztropine (COGENTIN) 1 MG tablet Take 1 mg by mouth daily.      . camphor-menthol (SARNA) lotion Apply 1 application topically daily as needed for itching.      . clindamycin (CLEOCIN T) 1 % external solution Apply 1 application topically 2 (two) times daily as needed (for hidradenitis).      . clopidogrel (PLAVIX) 75 MG tablet Take 75 mg by mouth daily.      Marland Kitchen doxycycline (VIBRA-TABS) 100 MG tablet Take 1 tablet (100 mg total) by mouth every 12 (twelve) hours.  6 tablet  0  . Ensure Plus (ENSURE PLUS) LIQD Take 237 mLs by mouth 2 (two) times daily between meals.      . ferrous sulfate 325 (65 FE) MG tablet Take 325 mg by mouth 2 (two) times daily.      Marland Kitchen gabapentin (NEURONTIN) 300 MG capsule Take 600 mg by mouth at bedtime.      . magnesium oxide (MAG-OX) 400 MG tablet Take 400 mg by mouth 3 (three) times daily.      . mirtazapine (REMERON) 15 MG tablet Take 15 mg by mouth at bedtime.      . Multiple Vitamin (MULTIVITAMIN WITH MINERALS) TABS Take 1 tablet by mouth daily.      . polyethylene glycol (MIRALAX / GLYCOLAX) packet  Take 17 g by mouth daily.      . QUEtiapine (SEROQUEL) 200 MG tablet Take 200 mg by mouth at bedtime.      . risperiDONE (RISPERDAL) 3 MG tablet Take 3 mg by mouth 2 (two) times daily.      Marland Kitchen triamcinolone cream (KENALOG) 0.1 % Apply 1 application topically 2 (two) times daily as needed (for dermatitis).       Scheduled:  . allopurinol  300 mg Oral Daily  . aspirin EC  81 mg Oral Daily  . atorvastatin  80 mg Oral Daily  . benztropine  1 mg Oral Daily  . clopidogrel  75 mg  Oral Q breakfast  . feeding supplement  237 mL Oral BID BM  . ferrous sulfate  325 mg Oral BID  . gabapentin  600 mg Oral QHS  . magnesium oxide  400 mg Oral TID  . mirtazapine  15 mg Oral QHS  . multivitamin with minerals  1 tablet Oral Daily  . piperacillin-tazobactam  3.375 g Intravenous Once  . piperacillin-tazobactam (ZOSYN)  IV  3.375 g Intravenous Q8H  . polyethylene glycol  17 g Oral Daily  . QUEtiapine  200 mg Oral QHS  . risperiDONE  3 mg Oral BID  . sodium chloride  3 mL Intravenous Q12H  . vancomycin  1,500 mg Intravenous Q24H   Infusions:  . sodium chloride      Assessment: 57yo male w/ recent multiple I&Ds now comes from OSH for signs of SIRS, to begin IV ABX for cellulitis.  Goal of Therapy:  Vancomycin trough level 10-15 mcg/ml  Plan:  Rec'd vanc 1g and Zosyn 3.375g at OSH; will continue with vancomycin 1500mg  IV Q24H and Zosyn 3.375g IV Q8H and monitor CBC, Cx, levels prn.  Vernard Gambles, PharmD, BCPS  04/13/2013,4:54 AM

## 2013-04-13 NOTE — H&P (Signed)
[facility] History and Physical  [name] WUJ:[id number] DOB: Jun 08, 1956 DOA: 04/13/2013  Referring physician: Transferred from [facility]. PCP: No PCP Per Patient   Chief Complaint: Increasing discharge from the groin area.  HPI: Xavier Khan is a 57 y.o. male who was raised the discharged 2 days ago from our facility after being treated for acute on chronic worsening of hidradenitis suppurativa of the groin area and had I&D done by surgery was placed on antibiotics and discharged back to nursing home was found to have increasing discharge with leukocytosis and initially mildly hypotensive with tachycardia. Patient was taken to [facility] regional ER and since patient had received his care recently he was transferred here. Patient has also had mild neck rigidity which was attributed to dystonia from is anti-schizophrenia medication. Benadryl IV one dose was given and patient felt better. At this time patient denies any chest pain shortness of breath nausea vomiting or abdominal pain or diarrhea. On exam he does have multiple wounds on his groin and back which all are showing discharge. Some of the wounds are showing mild bloody discharge.  Review of Systems: As presented in the history of presenting illness, rest negative.  Past Medical History  Diagnosis Date  . Diabetes mellitus with polyneuropathy   . Blood transfusion without reported diagnosis   . Anemia   . Adenocarcinoma, colon     Colon  . Gout   . CAD (coronary artery disease)   . MI, old   . CVA (cerebral infarction)   . Hemiplegia, post-stroke   . Protein calorie malnutrition   . Stable angina   . Suppurative hidradenitis     ch  . Diabetes mellitus without complication   . Mental disorder     schizophrenia   Past Surgical History  Procedure Laterality Date  . Cardiac surgery      Stent 2013  . Coloscop    . Status post hemicolectomy      February 2013 at [facility]  . Wound debridement Left  04/07/2013    Procedure: DEBRIDEMENT WOUND;  Surgeon: Cherylynn Ridges, MD;  Location: [facility] OR;  Service: General;  Laterality: Left;  Left buttocks  . Irrigation and debridement abscess Left 04/07/2013    Procedure: IRRIGATION AND DEBRIDEMENT ABSCESS;  Surgeon: Cherylynn Ridges, MD;  Location: [facility] OR;  Service: General;  Laterality: Left;  Left Groin   Social History:  reports that he has been smoking Cigarettes.  He has been smoking about 0.02 packs per day for the past 0 years. He does not have any smokeless tobacco history on file. He reports that he does not drink alcohol or use illicit drugs. Nursing home. where does patient live-- Cannot do ADLs. Can patient participate in ADLs?  Allergies  Allergen Reactions  . Asa (Aspirin)     History reviewed. No pertinent family history.    Prior to Admission medications   Medication Sig Start Date End Date Taking? Authorizing Provider  acetaminophen (TYLENOL) 650 MG CR tablet Take 650 mg by mouth every 8 (eight) hours as needed for pain.    Historical Provider, MD  allopurinol (ZYLOPRIM) 300 MG tablet Take 300 mg by mouth daily.    Historical Provider, MD  amoxicillin-clavulanate (AUGMENTIN) 500-125 MG per tablet Take 1 tablet (500 mg total) by mouth 2 (two) times daily with a meal. 04/10/13   Kathlen Mody, MD  aspirin EC 81 MG tablet Take 81 mg by mouth daily.    Historical Provider, MD  atorvastatin (  LIPITOR) 80 MG tablet Take 80 mg by mouth daily.    Historical Provider, MD  benztropine (COGENTIN) 1 MG tablet Take 1 mg by mouth daily.    Historical Provider, MD  camphor-menthol Wynelle Fanny) lotion Apply 1 application topically daily as needed for itching.    Historical Provider, MD  clindamycin (CLEOCIN T) 1 % external solution Apply 1 application topically 2 (two) times daily as needed (for hidradenitis).    Historical Provider, MD  clopidogrel (PLAVIX) 75 MG tablet Take 75 mg by mouth daily.    Historical Provider, MD  doxycycline (VIBRA-TABS) 100 MG  tablet Take 1 tablet (100 mg total) by mouth every 12 (twelve) hours. 04/10/13   Kathlen Mody, MD  Ensure Plus (ENSURE PLUS) LIQD Take 237 mLs by mouth 2 (two) times daily between meals.    Historical Provider, MD  ferrous sulfate 325 (65 FE) MG tablet Take 325 mg by mouth 2 (two) times daily.    Historical Provider, MD  gabapentin (NEURONTIN) 300 MG capsule Take 600 mg by mouth at bedtime.    Historical Provider, MD  magnesium oxide (MAG-OX) 400 MG tablet Take 400 mg by mouth 3 (three) times daily.    Historical Provider, MD  mirtazapine (REMERON) 15 MG tablet Take 15 mg by mouth at bedtime.    Historical Provider, MD  Multiple Vitamin (MULTIVITAMIN WITH MINERALS) TABS Take 1 tablet by mouth daily. 04/10/13   Kathlen Mody, MD  polyethylene glycol (MIRALAX / GLYCOLAX) packet Take 17 g by mouth daily.    Historical Provider, MD  QUEtiapine (SEROQUEL) 200 MG tablet Take 200 mg by mouth at bedtime.    Historical Provider, MD  risperiDONE (RISPERDAL) 3 MG tablet Take 3 mg by mouth 2 (two) times daily.    Historical Provider, MD  triamcinolone cream (KENALOG) 0.1 % Apply 1 application topically 2 (two) times daily as needed (for dermatitis).    Historical Provider, MD   Physical Exam: Filed Vitals:   04/13/13 0300  BP: 120/55  Pulse: 108  Temp: 98.7 F (37.1 C)  TempSrc: Oral  Resp: 30  Height: 5\' 4"  (1.626 m)  Weight: 55.1 kg (121 lb 7.6 oz)  SpO2: 99%     General:  Well-developed and moderately nourished.  Eyes: Anicteric no pallor.  ENT: No discharge from the ears eyes nose mouth.  Neck: No mass felt.  Cardiovascular: S1-S2 heard tachycardic.  Respiratory: No rhonchi or crepitations.  Abdomen: Soft nontender bowel sounds present.  Skin: Patient has multiple ulcerations on his groin and back with mild discharge. Patient also has skin changes in his axilla.  Musculoskeletal: No edema.  Psychiatric: Present is alert awake oriented and follows commands.  Neurologic: Left-sided  hemiplegia from previous stroke.  Labs on Admission:  Basic Metabolic Panel:  Recent Labs Lab 04/08/13 0505  NA 130*  K 4.0  CL 94*  CO2 26  GLUCOSE 124*  BUN 9  CREATININE 0.51  CALCIUM 9.0   Liver Function Tests: No results found for this basename: AST, ALT, ALKPHOS, BILITOT, PROT, ALBUMIN,  in the last 168 hours No results found for this basename: LIPASE, AMYLASE,  in the last 168 hours No results found for this basename: AMMONIA,  in the last 168 hours CBC:  Recent Labs Lab 04/06/13 0445 04/08/13 0505  WBC  --  11.6*  HGB 9.2* 8.9*  HCT 27.7* 27.6*  MCV  --  93.6  PLT  --  390   Cardiac Enzymes: No results found for this basename:  CKTOTAL, CKMB, CKMBINDEX, TROPONINI,  in the last 168 hours  BNP (last 3 results) No results found for this basename: PROBNP,  in the last 8760 hours CBG:  Recent Labs Lab 04/09/13 2018 04/10/13 0053 04/10/13 0443 04/10/13 0814 04/10/13 1144  GLUCAP 273* 169* 135* 174* 182*    Radiological Exams on Admission: No results found.    Assessment/Plan Principal Problem:   Cellulitis Active Problems:   CAD (coronary artery disease)   Hidradenitis suppurativa   Schizophrenia   History of CVA (cerebrovascular accident)   Anemia   Gout   1. SIRS secondary to acute on chronic worsening Hidranitis suppuritiva of the groin area - patient has been placed on vancomycin and Zosyn. Blood cultures have been ordered. Consult surgery in a.m. 2. History of CAD status post stenting - denies any chest pain. 3. Schizophrenia - patient was initially found to have mild neck rigidity from dystonia secondary to patient's medications probably. This improved with Benadryl IV and may need further doses. 4. History of CVA with left-sided hemiplegia - on antiplatelet agents. 5. Chronic anemia - follow CBC. 6. History of gout - continue present medications. 7. History of colon cancer status post hemicolectomy.  Patient's labs including metabolic  panel CBC lactic acid INR chest x-ray and blood cultures are pending.    Code Status: Full code.  Family Communication: None.  Disposition Plan: Admit to inpatient.    KAKRAKANDY,ARSHAD N. [facility] Pager [phone].  If 7PM-7AM, please contact night-coverage [email or web address] Password [facility] 04/13/2013, 4:29 AM

## 2013-04-13 NOTE — Progress Notes (Signed)
Patient ID: Xavier Khan, male   DOB: 21-Feb-1956, 57 y.o.   MRN: [id number]    Subjective: Ask to re-eval patient for worsening drainage in groin and under arm.  He underwent a simple incision and drainage of an abscess in his left groin on 5/31 with plans for outpatient follow up to evaluation his hidradenitis suppruativa..  He went to [facility] due to worsening drainage, low BP and tachycardia.  He was transferred here due to having recent treatment here.  He denies increase in pain and denies other symptoms although I don't know the reliability of his responses.  Objective: Vital signs in last 24 hours: Temp:  [98.2 F (36.8 C)-98.7 F (37.1 C)] 98.2 F (36.8 C) (06/06 1200) Pulse Rate:  [92-108] 92 (06/06 1200) Resp:  [13-30] 17 (06/06 1200) BP: (108-120)/(55-65) 114/65 mmHg (06/06 1200) SpO2:  [99 %-100 %] 100 % (06/06 1200) Weight:  [121 lb 7.6 oz (55.1 kg)] 121 lb 7.6 oz (55.1 kg) (06/06 0300)    Intake/Output from previous day: 06/05 0701 - 06/06 0700 In: 125 [I.V.:75; IV Piggyback:50] Out: -  Intake/Output this shift:    PE: General: NAD Heart: tachy, RR Lungs: CTA bi Abd: soft, non tender, +BS Left groin is healhy with no significant drainage however there is new areas at the base of his penis and scrotum that are hard and draining purulent material, +odor, there appears to be some along the medial thigh also, this does appear different from prior admission  Lab Results:   Recent Labs  04/13/13 0708  WBC 16.5*  HGB 8.6*  HCT 26.2*  PLT 453*   BMET  Recent Labs  04/13/13 0420  NA 126*  K 6.0*  CL 92*  CO2 23  GLUCOSE 213*  BUN 9  CREATININE 0.40*  CALCIUM 8.8   PT/INR  Recent Labs  04/13/13 0708  LABPROT 15.5*  INR 1.25   CMP     Component Value Date/Time   NA 126* 04/13/2013 0420   K 6.0* 04/13/2013 0420   CL 92* 04/13/2013 0420   CO2 23 04/13/2013 0420   GLUCOSE 213* 04/13/2013 0420   BUN 9 04/13/2013 0420   CREATININE 0.40* 04/13/2013 0420   CALCIUM  8.8 04/13/2013 0420   PROT 10.1* 04/13/2013 0420   ALBUMIN 1.7* 04/13/2013 0420   AST 44* 04/13/2013 0420   ALT 29 04/13/2013 0420   ALKPHOS 229* 04/13/2013 0420   BILITOT 0.4 04/13/2013 0420   GFRNONAA >90 04/13/2013 0420   GFRAA >90 04/13/2013 0420   Lipase  No results found for this basename: lipase       Studies/Results: Dg Chest Port 1 View  04/13/2013   *RADIOLOGY REPORT*  Clinical Data: Infiltrates.  PORTABLE CHEST - 1 VIEW  Comparison: 03/30/2013.  Findings: Trachea is midline.  Heart size normal.  Lungs are somewhat low in volume but clear. Previously suspected opacity in the left upper lobe is no longer visualized.  No pleural fluid.  IMPRESSION: Low lung volumes without acute finding.   Original Report Authenticated By: Leanna Battles, M.D.    Anti-infectives: Anti-infectives   Start     Dose/Rate Route Frequency Ordered Stop   04/13/13 1200  vancomycin (VANCOCIN) 1,500 mg in sodium chloride 0.9 % 500 mL IVPB     1,500 mg 250 mL/hr over 120 Minutes Intravenous Every 24 hours 04/13/13 0454     04/13/13 0600  piperacillin-tazobactam (ZOSYN) IVPB 3.375 g     3.375 g 12.5 mL/hr over 240 Minutes Intravenous  Every 8 hours 04/13/13 0454     04/13/13 0500  piperacillin-tazobactam (ZOSYN) IVPB 3.375 g  Status:  Discontinued     3.375 g 100 mL/hr over 30 Minutes Intravenous  Once 04/13/13 0454 04/13/13 0504       Assessment/Plan Left groin hidradenitis suppurative: the drained area looks good but there appears to be new areas involving the penis and scrotum, recommend urology consult to eval the need for surgical intervention on this, the surgical area looks ok, continue wet to dry.   LOS: 0 days    Toniesha Zellner 04/13/2013

## 2013-04-13 NOTE — Progress Notes (Signed)
Pt discharged but continues to wait for 02 to be delivered.

## 2013-04-13 NOTE — Progress Notes (Signed)
Utilization review completed. Jaisean Monteforte, RN, BSN. 

## 2013-04-13 NOTE — Progress Notes (Signed)
Left thigh drained.  Scrotal involvement/ penis involvement requires urology evaluation.

## 2013-04-13 NOTE — Progress Notes (Signed)
Pt seen and examined, admitted this am per Dr.K Hydradenitis with recent I&D of groin abscess on 5/31 Will ask [facility] to eval Continue Empiric Abx   Zannie Cove, MD [phone]

## 2013-04-14 DIAGNOSIS — F209 Schizophrenia, unspecified: Secondary | ICD-10-CM

## 2013-04-14 DIAGNOSIS — E440 Moderate protein-calorie malnutrition: Secondary | ICD-10-CM

## 2013-04-14 DIAGNOSIS — F0390 Unspecified dementia without behavioral disturbance: Secondary | ICD-10-CM

## 2013-04-14 LAB — GLUCOSE, CAPILLARY
Glucose-Capillary: 130 mg/dL — ABNORMAL HIGH (ref 70–99)
Glucose-Capillary: 219 mg/dL — ABNORMAL HIGH (ref 70–99)

## 2013-04-14 LAB — BASIC METABOLIC PANEL
BUN: 7 mg/dL (ref 6–23)
Calcium: 8.9 mg/dL (ref 8.4–10.5)
GFR calc non Af Amer: >90 mL/min (ref >90)
Glucose, Bld: 126 mg/dL — ABNORMAL HIGH (ref 70–99)

## 2013-04-14 LAB — CBC
Hemoglobin: 8.7 g/dL — ABNORMAL LOW (ref 13.0–17.0)
MCH: 30.2 pg (ref 26.0–34.0)
MCHC: 32.2 g/dL (ref 30.0–36.0)
MCV: 93.8 fL (ref 78.0–100.0)
Platelets: 461 10*3/uL — ABNORMAL HIGH (ref 150–400)

## 2013-04-14 MED ORDER — VANCOMYCIN HCL IN DEXTROSE 1-5 GM/200ML-% IV SOLN
1000.0000 mg | INTRAVENOUS | Status: DC
  Administered 2013-04-14 – 2013-04-16 (×3): 1000 mg via INTRAVENOUS
  Filled 2013-04-14 (×3): qty 200

## 2013-04-14 NOTE — Progress Notes (Signed)
Urology Progress Note    Subjective: Hidrinitis. Case discussed with  Dr. Strect and [facility]. Pt seems to be healing his GS wounds. My concern is that he is a diabetic,. And a NCB, and we are putting him through surgery with poor recoveraility, and questionable quality of life recovery. [facility] has already discussed possibility of palliative care, and reports that sister wants most aggressive care possible-except for code status.    No acute urologic events overnight. Ambulation:   negative Flatus:    negative Bowel movement  negative  Pain: complete resolution  Objective:  Blood pressure 110/61, pulse 102, temperature 97.9 F (36.6 C), temperature source Oral, resp. rate 17, height 5' 4" (1.626 m), weight 55.1 kg (121 lb 7.6 oz), SpO2 100.00%.  Physical Exam:  General:  No acute distress, awake Extremities: extremities normal, atraumatic, no cyanosis or edema Genitourinary:  nochange Foley: none    I/O last 3 completed shifts: In: 2548 [P.O.:120; I.V.:2278; IV Piggyback:150] Out: 1025 [Urine:1025]  Recent Labs     04/13/13  0708  04/14/13  0550  HGB  8.6*  8.7*  WBC  16.5*  16.5*  PLT  453*  461*    Recent Labs     04/13/13  1615  04/14/13  0550  NA  133*  136  K  3.7  3.6  CL  96  98  CO2  30  30  BUN  9  7  CREATININE  0.46*  0.62  CALCIUM  8.4  8.9  GFRNONAA  >90  >90  GFRAA  >90  >90     Recent Labs     04/13/13  0708  INR  1.25     No components found with this basename: ABG,   Assessment/Plan:  Continue any current medications.  Will plan for scrotal  I/D in AM . Nurse to get permit.( consent)  Case posted.    

## 2013-04-14 NOTE — Progress Notes (Addendum)
[facility] PROGRESS NOTE  Xavier Khan ZOX:[id number] DOB: 1956/05/24 DOA: 04/13/2013 PCP: No PCP Per Patient  Assessment/Plan:  1. Acute on chronic Hidradenitis suppuritiva of the groin area with scrotal involvement.      -recent debridement of groin 5/31      -continue vancomycin and Zosyn.      -CCS/URology following      -weighing risk/benefit of scrotal debridement  2. History of CAD status post stenting      -stable . 3. Schizophrenia -      - continue Seroquel/Risperdone      -cogentin stopped      -Sister is PoA, pt does not have capacity to make decisions  4. History of CVA with left-sided hemiplegia - on antiplatelet agents.  5. Chronic anemia        -due to blood loss from 1, chronic disease stable  6. History of gout - continue allopurinol.  7. History of colon cancer status post hemicolectomy  8.   DM: SSI for now  DVt proph: SCDs  Code status: DNR Called and updated sister Xavier Khan   Consultants:  CC[facility]  Urology  Antibiotics:  VAnc 6/6  Zosyn 6/6  HPI/Subjective: No new complaints  Objective: Filed Vitals:   04/13/13 2004 04/14/13 0016 04/14/13 0315 04/14/13 0823  BP: 131/67 113/63 106/57 103/56  Pulse: 94 108 96 90  Temp: 98.2 F (36.8 C) 98.8 F (37.1 C) 97.9 F (36.6 C) 97.6 F (36.4 C)  TempSrc: Oral Oral Axillary Axillary  Resp: 19 22 34 15  Height:      Weight:      SpO2: 99% 96% 97% 99%    Intake/Output Summary (Last 24 hours) at 04/14/13 0831 Last data filed at 04/14/13 0537  Gross per 24 hour  Intake   2423 ml  Output   1025 ml  Net   1398 ml   Filed Weights   04/13/13 0300  Weight: 55.1 kg (121 lb 7.6 oz)    Exam:   General:  Alert, awake, oriented to self, place  Cardiovascular: S1S2/RRR  Respiratory: CTAB  Abdomen: soft, NT, BS present  Musculoskeletal: no edema c/c  Perineum:  Medial thigh wounds,  Base of penis and Scrotal thickening with purulent drainage covered with WTD dressing  Data  Reviewed: Basic Metabolic Panel:  Recent Labs Lab 04/08/13 0505 04/13/13 0420 04/13/13 1615 04/14/13 0550  NA 130* 126* 133* 136  K 4.0 6.0* 3.7 3.6  CL 94* 92* 96 98  CO2 26 23 30 30   GLUCOSE 124* 213* 240* 126*  BUN 9 9 9 7   CREATININE 0.51 0.40* 0.46* 0.62  CALCIUM 9.0 8.8 8.4 8.9   Liver Function Tests:  Recent Labs Lab 04/13/13 0420  AST 44*  ALT 29  ALKPHOS 229*  BILITOT 0.4  PROT 10.1*  ALBUMIN 1.7*   No results found for this basename: LIPASE, AMYLASE,  in the last 168 hours No results found for this basename: AMMONIA,  in the last 168 hours CBC:  Recent Labs Lab 04/08/13 0505 04/13/13 0708 04/14/13 0550  WBC 11.6* 16.5* 16.5*  NEUTROABS  --  14.3*  --   HGB 8.9* 8.6* 8.7*  HCT 27.6* 26.2* 27.0*  MCV 93.6 91.9 93.8  PLT 390 453* 461*   Cardiac Enzymes: No results found for this basename: CKTOTAL, CKMB, CKMBINDEX, TROPONINI,  in the last 168 hours BNP (last 3 results) No results found for this basename: PROBNP,  in the last 8760 hours CBG:  Recent Labs  Lab 04/13/13 0830 04/13/13 1216 04/13/13 1702 04/13/13 2141 04/14/13 0758  GLUCAP 238* 296* 192* 89 130*    Recent Results (from the past 240 hour(s))  CULTURE, ROUTINE-ABSCESS     Status: None   Collection Time    04/07/13 11:14 AM      Result Value Range Status   Specimen Description ABSCESS LEFT GROIN   Final   Special Requests PATIENT ON FOLLOWING AUGMENTIN   Final   Gram Stain     Final   Value: NO WBC SEEN     NO SQUAMOUS EPITHELIAL CELLS SEEN     NO ORGANISMS SEEN   Culture NO GROWTH 3 DAYS   Final   Report Status 04/10/2013 FINAL   Final  ANAEROBIC CULTURE     Status: None   Collection Time    04/07/13 11:14 AM      Result Value Range Status   Specimen Description ABSCESS LEFT GROIN   Final   Special Requests PATIENT ON FOLLOWING AUGMENTIN   Final   Gram Stain     Final   Value: NO WBC SEEN     NO SQUAMOUS EPITHELIAL CELLS SEEN     NO ORGANISMS SEEN   Culture MULTIPLE  ORGANISMS PRESENT, NONE PREDOMINANT   Final   Report Status 04/13/2013 FINAL   Final  MRSA PCR SCREENING     Status: None   Collection Time    04/13/13  3:29 AM      Result Value Range Status   MRSA by PCR NEGATIVE  NEGATIVE Final   Comment:            The GeneXpert MRSA Assay (FDA     approved for NASAL specimens     only), is one component of a     comprehensive MRSA colonization     surveillance program. It is not     intended to diagnose MRSA     infection nor to guide or     monitor treatment for     MRSA infections.     Studies: Dg Chest Port 1 View  04/13/2013   *RADIOLOGY REPORT*  Clinical Data: Infiltrates.  PORTABLE CHEST - 1 VIEW  Comparison: 03/30/2013.  Findings: Trachea is midline.  Heart size normal.  Lungs are somewhat low in volume but clear. Previously suspected opacity in the left upper lobe is no longer visualized.  No pleural fluid.  IMPRESSION: Low lung volumes without acute finding.   Original Report Authenticated By: Leanna Battles, M.D.    Scheduled Meds: . allopurinol  300 mg Oral Daily  . aspirin EC  81 mg Oral Daily  . atorvastatin  80 mg Oral Daily  . clopidogrel  75 mg Oral Q breakfast  . feeding supplement  237 mL Oral BID BM  . ferrous sulfate  325 mg Oral BID  . gabapentin  600 mg Oral QHS  . insulin aspart  0-15 Units Subcutaneous TID WC  . magnesium oxide  400 mg Oral TID  . mirtazapine  15 mg Oral QHS  . multivitamin with minerals  1 tablet Oral Daily  . piperacillin-tazobactam (ZOSYN)  IV  3.375 g Intravenous Q8H  . polyethylene glycol  17 g Oral Daily  . QUEtiapine  200 mg Oral QHS  . risperiDONE  3 mg Oral BID  . sodium chloride  3 mL Intravenous Q12H  . vancomycin  1,500 mg Intravenous Q24H   Continuous Infusions:   Principal Problem:   Cellulitis Active Problems:  CAD (coronary artery disease)   Hidradenitis suppurativa   Schizophrenia   History of CVA (cerebrovascular accident)   Anemia   Gout   Malnutrition of moderate  degree    Time spent:58min    [facility]  [facility] Pager [phone]. If 7PM-7AM, please contact night-coverage at [email or web address], password [facility] 04/14/2013, 8:31 AM  LOS: 1 day

## 2013-04-14 NOTE — Progress Notes (Signed)
   Assessment: s/p  Patient Active Problem List   Diagnosis Date Noted  . Malnutrition of moderate degree 04/14/2013  . Dementia 03/31/2013  . Cellulitis 03/30/2013  . CAD (coronary artery disease) 03/30/2013  . Hidradenitis suppurativa 03/30/2013  . Diabetes mellitus 03/30/2013  . Schizophrenia 03/30/2013  . History of CVA (cerebrovascular accident) 03/30/2013  . Anemia 03/30/2013  . Hyperlipidemia 03/30/2013  . Gout 03/30/2013    Severe hidradenitis with additional areas of active disease: areas already drained/opened are improviong  Plan: Continue local wound care; will discuss with urology return to OR for additional drainage/debridement of scrotal areas  Subjective: No apparent c/o  Objective: Vital signs in last 24 hours: Temp:  [97.6 F (36.4 C)-98.8 F (37.1 C)] 97.6 F (36.4 C) (06/07 0823) Pulse Rate:  [90-108] 90 (06/07 0823) Resp:  [15-34] 15 (06/07 0823) BP: (103-131)/(56-67) 103/56 mmHg (06/07 0823) SpO2:  [96 %-100 %] 99 % (06/07 0823)   Intake/Output from previous day: 06/06 0701 - 06/07 0700 In: 2423 [P.O.:120; I.V.:2203; IV Piggyback:100] Out: 1025 [Urine:1025]  General appearance: cachectic, fatigued, no distress and slowed mentation  Incision: Buttock wounds fairly clean, opened areas in left and right groin also beginning to granulate; still has draining areas on scrotum, left iinner thig and right groin/inguinal crease  Lab Results:   Recent Labs  04/13/13 0708 04/14/13 0550  WBC 16.5* 16.5*  HGB 8.6* 8.7*  HCT 26.2* 27.0*  PLT 453* 461*   BMET  Recent Labs  04/13/13 1615 04/14/13 0550  NA 133* 136  K 3.7 3.6  CL 96 98  CO2 30 30  GLUCOSE 240* 126*  BUN 9 7  CREATININE 0.46* 0.62  CALCIUM 8.4 8.9    MEDS, Scheduled . allopurinol  300 mg Oral Daily  . aspirin EC  81 mg Oral Daily  . atorvastatin  80 mg Oral Daily  . clopidogrel  75 mg Oral Q breakfast  . feeding supplement  237 mL Oral BID BM  . ferrous sulfate   325 mg Oral BID  . gabapentin  600 mg Oral QHS  . insulin aspart  0-15 Units Subcutaneous TID WC  . magnesium oxide  400 mg Oral TID  . mirtazapine  15 mg Oral QHS  . multivitamin with minerals  1 tablet Oral Daily  . piperacillin-tazobactam (ZOSYN)  IV  3.375 g Intravenous Q8H  . polyethylene glycol  17 g Oral Daily  . QUEtiapine  200 mg Oral QHS  . risperiDONE  3 mg Oral BID  . sodium chloride  3 mL Intravenous Q12H  . vancomycin  1,000 mg Intravenous Q24H    Studies/Results: Dg Chest Port 1 View  04/13/2013   *RADIOLOGY REPORT*  Clinical Data: Infiltrates.  PORTABLE CHEST - 1 VIEW  Comparison: 03/30/2013.  Findings: Trachea is midline.  Heart size normal.  Lungs are somewhat low in volume but clear. Previously suspected opacity in the left upper lobe is no longer visualized.  No pleural fluid.  IMPRESSION: Low lung volumes without acute finding.   Original Report Authenticated By: Leanna Battles, M.D.      LOS: 1 day     Currie Paris, MD, [facility], [place] [phone]   04/14/2013 9:30 AM

## 2013-04-14 NOTE — Progress Notes (Signed)
ANTIBIOTIC CONSULT NOTE - INITIAL  Pharmacy Consult for Vancocin and Zosyn Indication: cellulitis  Allergies  Allergen Reactions  . Asa (Aspirin)     Takes baby enteric-coated ASA at home.    Patient Measurements: Height: 5\' 4"  (162.6 cm) Weight: 121 lb 7.6 oz (55.1 kg) IBW/kg (Calculated) : 59.2  Vital Signs: Temp: 97.6 F (36.4 C) (06/07 0823) Temp src: Axillary (06/07 0823) BP: 103/56 mmHg (06/07 0823) Pulse Rate: 90 (06/07 0823)   Microbiology: Recent Results (from the past 720 hour(s))  CULTURE, BLOOD (ROUTINE X 2)     Status: None   Collection Time    03/30/13  6:00 PM      Result Value Range Status   Specimen Description BLOOD RIGHT WRIST   Final   Special Requests BOTTLES DRAWN AEROBIC ONLY 5CC   Final   Culture  Setup Time 03/31/2013 03:03   Final   Culture NO GROWTH 5 DAYS   Final   Report Status 04/06/2013 FINAL   Final  CULTURE, BLOOD (ROUTINE X 2)     Status: None   Collection Time    03/30/13  7:08 PM      Result Value Range Status   Specimen Description BLOOD HAND RIGHT   Final   Special Requests BOTTLES DRAWN AEROBIC ONLY 3CC   Final   Culture  Setup Time 03/31/2013 03:03   Final   Culture NO GROWTH 5 DAYS   Final   Report Status 04/06/2013 FINAL   Final  URINE CULTURE     Status: None   Collection Time    03/30/13  8:59 PM      Result Value Range Status   Specimen Description URINE, CLEAN CATCH   Final   Special Requests NONE   Final   Culture  Setup Time 03/31/2013 01:50   Final   Colony Count 3,000 COLONIES/ML   Final   Culture INSIGNIFICANT GROWTH   Final   Report Status 04/01/2013 FINAL   Final  GRAM STAIN     Status: None   Collection Time    03/30/13  8:59 PM      Result Value Range Status   Specimen Description URINE, CLEAN CATCH   Final   Special Requests NONE   Final   Gram Stain     Final   Value: CYTOSPIN     Neg for bacteria     WBC PRESENT,BOTH PMN AND MONONUCLEAR Only 2 cells seen on slide     Results Called toRayburn Ma,  RN [name]   Report Status 03/30/2013 FINAL   Final  MRSA PCR SCREENING     Status: None   Collection Time    03/31/13 12:17 AM      Result Value Range Status   MRSA by PCR NEGATIVE  NEGATIVE Final   Comment:            The GeneXpert MRSA Assay (FDA     approved for NASAL specimens     only), is one component of a     comprehensive MRSA colonization     surveillance program. It is not     intended to diagnose MRSA     infection nor to guide or     monitor treatment for     MRSA infections.  CULTURE, ROUTINE-ABSCESS     Status: None   Collection Time    04/07/13 11:14 AM      Result Value Range Status   Specimen Description ABSCESS  LEFT GROIN   Final   Special Requests PATIENT ON FOLLOWING AUGMENTIN   Final   Gram Stain     Final   Value: NO WBC SEEN     NO SQUAMOUS EPITHELIAL CELLS SEEN     NO ORGANISMS SEEN   Culture NO GROWTH 3 DAYS   Final   Report Status 04/10/2013 FINAL   Final  ANAEROBIC CULTURE     Status: None   Collection Time    04/07/13 11:14 AM      Result Value Range Status   Specimen Description ABSCESS LEFT GROIN   Final   Special Requests PATIENT ON FOLLOWING AUGMENTIN   Final   Gram Stain     Final   Value: NO WBC SEEN     NO SQUAMOUS EPITHELIAL CELLS SEEN     NO ORGANISMS SEEN   Culture MULTIPLE ORGANISMS PRESENT, NONE PREDOMINANT   Final   Report Status 04/13/2013 FINAL   Final  MRSA PCR SCREENING     Status: None   Collection Time    04/13/13  3:29 AM      Result Value Range Status   MRSA by PCR NEGATIVE  NEGATIVE Final   Comment:            The GeneXpert MRSA Assay (FDA     approved for NASAL specimens     only), is one component of a     comprehensive MRSA colonization     surveillance program. It is not     intended to diagnose MRSA     infection nor to guide or     monitor treatment for     MRSA infections.    Medical History: Past Medical History  Diagnosis Date  . Diabetes mellitus with polyneuropathy   . Blood  transfusion without reported diagnosis   . Anemia   . Adenocarcinoma, colon     Colon  . Gout   . CAD (coronary artery disease)   . MI, old   . CVA (cerebral infarction)   . Hemiplegia, post-stroke   . Protein calorie malnutrition   . Stable angina   . Suppurative hidradenitis     ch  . Diabetes mellitus without complication   . Mental disorder     schizophrenia    Medications:  Prescriptions prior to admission  Medication Sig Dispense Refill  . acetaminophen (TYLENOL) 650 MG CR tablet Take 650 mg by mouth every 8 (eight) hours as needed for pain.      Marland Kitchen allopurinol (ZYLOPRIM) 300 MG tablet Take 300 mg by mouth daily.      Marland Kitchen amoxicillin-clavulanate (AUGMENTIN) 500-125 MG per tablet Take 1 tablet (500 mg total) by mouth 2 (two) times daily with a meal.  6 tablet  0  . aspirin EC 81 MG tablet Take 81 mg by mouth daily.      Marland Kitchen atorvastatin (LIPITOR) 80 MG tablet Take 80 mg by mouth daily.      . benztropine (COGENTIN) 1 MG tablet Take 1 mg by mouth daily.      . camphor-menthol (SARNA) lotion Apply 1 application topically daily as needed for itching.      . clindamycin (CLEOCIN T) 1 % external solution Apply 1 application topically 2 (two) times daily as needed (for hidradenitis).      . clopidogrel (PLAVIX) 75 MG tablet Take 75 mg by mouth daily.      Marland Kitchen doxycycline (VIBRA-TABS) 100 MG tablet Take 1 tablet (100 mg total) by  mouth every 12 (twelve) hours.  6 tablet  0  . Ensure Plus (ENSURE PLUS) LIQD Take 237 mLs by mouth 2 (two) times daily between meals.      . ferrous sulfate 325 (65 FE) MG tablet Take 325 mg by mouth 2 (two) times daily.      Marland Kitchen gabapentin (NEURONTIN) 300 MG capsule Take 600 mg by mouth at bedtime.      . magnesium oxide (MAG-OX) 400 MG tablet Take 400 mg by mouth 3 (three) times daily.      . mirtazapine (REMERON) 15 MG tablet Take 15 mg by mouth at bedtime.      . Multiple Vitamin (MULTIVITAMIN WITH MINERALS) TABS Take 1 tablet by mouth daily.      .  polyethylene glycol (MIRALAX / GLYCOLAX) packet Take 17 g by mouth daily.      . QUEtiapine (SEROQUEL) 200 MG tablet Take 200 mg by mouth at bedtime.      . risperiDONE (RISPERDAL) 3 MG tablet Take 3 mg by mouth 2 (two) times daily.      Marland Kitchen triamcinolone cream (KENALOG) 0.1 % Apply 1 application topically 2 (two) times daily as needed (for dermatitis).       Scheduled:  . allopurinol  300 mg Oral Daily  . aspirin EC  81 mg Oral Daily  . atorvastatin  80 mg Oral Daily  . clopidogrel  75 mg Oral Q breakfast  . feeding supplement  237 mL Oral BID BM  . ferrous sulfate  325 mg Oral BID  . gabapentin  600 mg Oral QHS  . insulin aspart  0-15 Units Subcutaneous TID WC  . magnesium oxide  400 mg Oral TID  . mirtazapine  15 mg Oral QHS  . multivitamin with minerals  1 tablet Oral Daily  . piperacillin-tazobactam (ZOSYN)  IV  3.375 g Intravenous Q8H  . polyethylene glycol  17 g Oral Daily  . QUEtiapine  200 mg Oral QHS  . risperiDONE  3 mg Oral BID  . sodium chloride  3 mL Intravenous Q12H  . vancomycin  1,500 mg Intravenous Q24H   Infusions:     Assessment: 57yo male w/ recent multiple I&Ds now comes from OSH for signs of SIRS on IV ABX for cellulitis. Of note he has hemiplegia with stage 3 decubitis ulcers. His muscle mass is very low so calculated CrCl is expected to be an overestimate of renal function. Will reduce dose of vancomycin due to this and increasing SCr from 0.4 to 0.62.  Goal of Therapy:  Vancomycin trough level 10-15 mcg/ml  Plan:  - Vancomycin 1000mg  IV Q24H - Zosyn 3.375g IV Q8H - Monitor renal function, cultures, and levels prn.   Vania Rea. Darin Engels.D. Clinical Pharmacist Pager [phone] Phone [phone] 04/14/2013 9:00 AM

## 2013-04-14 NOTE — Progress Notes (Signed)
Subjective:    2nd admission for  [name], a 57 y.o. diabetic male with known history of hidranitis suppuritiva, CAD status post stenting, colon cancer status post surgery last year, CVA with left-sided hemiplegia, schizophrenia, ( sister is POA) chronic anemia was found to have increasing bleeding from his groin area where patient has hidranitis suppuritiva.  He has Stage 3 buttock decubitus, and is post I/D bilateral armpit and inguinal I/D of abscesses.    As per patient's sister, with whom patient lives, patient was being treated with multiple does of antibiotics over the last one month due to increased drainage from the groin area. Patient also has been having slow blood loss from the area.    He was admitted to the hospital with more than usual blood loss and patient was felt to be weak. On arrival in the ER patient was found to be tachycardic and tachypneic and it improved with fluids. On exam patient had increase drainage from the groin area with blood. On-call surgeon was consulted and at this time patient has been admitted for worsening of is hidradenitis suppurativa. CT abdomen and pelvis neg , except for decubitus ulcer. He is a DNR. Pt now post bilateral axillary I/D, and bilateral groin I/D. Cultures negative to date. Urology consult for thickened scrotum with possible infection. ( sister is POA). On wer-to-dry dressings. Overnight, no change in scrotal drainage.      Objective: Vital signs in last 24 hours: Temp:  [97.9 F (36.6 C)-98.8 F (37.1 C)] 97.9 F (36.6 C) (06/07 0315) Pulse Rate:  [92-108] 96 (06/07 0315) Resp:  [14-34] 34 (06/07 0315) BP: (106-131)/(57-67) 106/57 mmHg (06/07 0315) SpO2:  [96 %-100 %] 97 % (06/07 0315)A  Intake/Output from previous day: 06/06 0701 - 06/07 0700 In: 2423 [P.O.:120; I.V.:2203; IV Piggyback:100] Out: 1025 [Urine:1025] Intake/Output this shift:    Past Medical History  Diagnosis Date  . Diabetes mellitus with polyneuropathy    . Blood transfusion without reported diagnosis   . Anemia   . Adenocarcinoma, colon     Colon  . Gout   . CAD (coronary artery disease)   . MI, old   . CVA (cerebral infarction)   . Hemiplegia, post-stroke   . Protein calorie malnutrition   . Stable angina   . Suppurative hidradenitis     ch  . Diabetes mellitus without complication   . Mental disorder     schizophrenia    Physical Exam:  Lungs - Normal respiratory effort, chest expands symmetrically.  Abdomen - Soft, non-tender & non-distended.  Lab Results:  Recent Labs  04/13/13 0708 04/14/13 0550  WBC 16.5* 16.5*  HGB 8.6* 8.7*  HCT 26.2* 27.0*   BMET  Recent Labs  04/13/13 0420 04/13/13 1615  NA 126* 133*  K 6.0* 3.7  CL 92* 96  CO2 23 30  GLUCOSE 213* 240*  BUN 9 9  CREATININE 0.40* 0.46*  CALCIUM 8.8 8.4   No results found for this basename: LABURIN,  in the last 72 hours Results for orders placed during the hospital encounter of 04/13/13  MRSA PCR SCREENING     Status: None   Collection Time    04/13/13  3:29 AM      Result Value Range Status   MRSA by PCR NEGATIVE  NEGATIVE Final   Comment:            The GeneXpert MRSA Assay (FDA     approved for NASAL specimens  only), is one component of a     comprehensive MRSA colonization     surveillance program. It is not     intended to diagnose MRSA     infection nor to guide or     monitor treatment for     MRSA infections.    Studies/Results: Clinical Data: Rectal bleeding, history of colon cancer  CT ABDOMEN AND PELVIS WITH CONTRAST  Technique: Multidetector CT imaging of the abdomen and pelvis was  performed following the standard protocol during bolus  administration of intravenous contrast.  Contrast: OMNIPAQUE IOHEXOL 300 MG/ML SOLN  Comparison: No similar prior study is available for comparison.  Findings: Trace right pleural effusion noted. Coronary arterial  stent or calcification noted. Lung bases are otherwise clear  with  the exception of minimal subpleural presumed atelectasis.  Gallstones noted without other CT evidence for acute cholecystitis.  Liver, right kidney, adrenal glands, spleen, and pancreas are  normal. Too small to characterize sub centimeter left renal  cortical hypodensities are noted, statistically most likely cysts.  Moderate atheromatous aortic calcification without aneurysm.  There is diffuse skin thickening and subjacent fat stranding  involving the left buttock which is incompletely imaged. There are  areas of subcutaneous gas within this area of the, for example  image 87. No bowel wall thickening or focal segmental dilatation  is identified. Lumbar spine degenerative change is noted.  IMPRESSION:  Diffuse skin thickening, subjacent fat stranding, and gas within  the superficial soft tissue of the left buttock, most compatible  with decubitus ulcer versus abscess in the absence of trauma.  Original Report Authenticated By: Christiana Pellant, M.D.         Assessment: No change in dermal hidrinitis. May need surgical debridement of scrotum tomorrow. Concern is that it will leave large open groin/scrotal wound. Would be very slow to heal.  Plan: Discuss with GS.   Hanya Guerin I 04/14/2013, 7:32 AM

## 2013-04-15 ENCOUNTER — Encounter (HOSPITAL_COMMUNITY): Payer: Self-pay | Admitting: Anesthesiology

## 2013-04-15 ENCOUNTER — Encounter (HOSPITAL_COMMUNITY)
Admission: AD | Disposition: A | Discharge status: Nursing Home (Includes ICF) | Payer: Self-pay | Source: Other Acute Inpatient Hospital | Attending: Internal Medicine

## 2013-04-15 ENCOUNTER — Inpatient Hospital Stay (HOSPITAL_COMMUNITY): Payer: Medicare (Managed Care) | Admitting: Critical Care Medicine

## 2013-04-15 ENCOUNTER — Encounter (HOSPITAL_COMMUNITY): Payer: Self-pay | Admitting: Critical Care Medicine

## 2013-04-15 DIAGNOSIS — D649 Anemia, unspecified: Secondary | ICD-10-CM

## 2013-04-15 HISTORY — PX: INCISION AND DRAINAGE ABSCESS: SHX5864

## 2013-04-15 HISTORY — PX: IRRIGATION AND DEBRIDEMENT ABSCESS: SHX5252

## 2013-04-15 HISTORY — PX: DRESSING CHANGE UNDER ANESTHESIA: SHX5237

## 2013-04-15 LAB — CBC
HCT: 25.3 % — ABNORMAL LOW (ref 39.0–52.0)
Hemoglobin: 8 g/dL — ABNORMAL LOW (ref 13.0–17.0)
RBC: 2.67 MIL/uL — ABNORMAL LOW (ref 4.22–5.81)
WBC: 16.7 10*3/uL — ABNORMAL HIGH (ref 4.0–10.5)

## 2013-04-15 LAB — GLUCOSE, CAPILLARY
Glucose-Capillary: 239 mg/dL — ABNORMAL HIGH (ref 70–99)
Glucose-Capillary: 345 mg/dL — ABNORMAL HIGH (ref 70–99)
Glucose-Capillary: 196 mg/dL — ABNORMAL HIGH (ref 70–99)

## 2013-04-15 SURGERY — IRRIGATION AND DEBRIDEMENT ABSCESS
Anesthesia: General | Site: Scrotum | Wound class: Dirty or Infected

## 2013-04-15 MED ORDER — FENTANYL CITRATE 0.05 MG/ML IJ SOLN
INTRAMUSCULAR | Status: DC | PRN
  Administered 2013-04-15 (×2): 50 ug via INTRAVENOUS
  Administered 2013-04-15: 25 ug via INTRAVENOUS
  Administered 2013-04-15: 50 ug via INTRAVENOUS
  Administered 2013-04-15: 25 ug via INTRAVENOUS
  Administered 2013-04-15: 50 ug via INTRAVENOUS

## 2013-04-15 MED ORDER — FENTANYL CITRATE 0.05 MG/ML IJ SOLN: 25.0000 ug | INTRAMUSCULAR | Status: DC | PRN

## 2013-04-15 MED ORDER — OXYCODONE HCL 5 MG PO TABS: 5.0000 mg | ORAL_TABLET | Freq: Once | ORAL | Status: DC | PRN

## 2013-04-15 MED ORDER — HYDROMORPHONE HCL PF 1 MG/ML IJ SOLN
1.0000 mg | INTRAMUSCULAR | Status: DC | PRN
  Administered 2013-04-15 – 2013-04-20 (×7): 1 mg via INTRAVENOUS
  Filled 2013-04-15 (×8): qty 1

## 2013-04-15 MED ORDER — 0.9 % SODIUM CHLORIDE (POUR BTL) OPTIME
TOPICAL | Status: DC | PRN
  Administered 2013-04-15: 1000 mL

## 2013-04-15 MED ORDER — EPHEDRINE SULFATE 50 MG/ML IJ SOLN
INTRAMUSCULAR | Status: DC | PRN
  Administered 2013-04-15: 10 mg via INTRAVENOUS

## 2013-04-15 MED ORDER — SODIUM CHLORIDE 0.9 % IV SOLN
INTRAVENOUS | Status: DC | PRN
  Administered 2013-04-15 (×2): via INTRAVENOUS

## 2013-04-15 MED ORDER — OXYCODONE HCL 5 MG/5ML PO SOLN: 5.0000 mg | Freq: Once | ORAL | Status: DC | PRN

## 2013-04-15 MED ORDER — ONDANSETRON HCL 4 MG/2ML IJ SOLN
INTRAMUSCULAR | Status: DC | PRN
  Administered 2013-04-15: 4 mg via INTRAVENOUS

## 2013-04-15 MED ORDER — LIDOCAINE HCL (CARDIAC) 20 MG/ML IV SOLN
INTRAVENOUS | Status: DC | PRN
  Administered 2013-04-15: 40 mg via INTRAVENOUS

## 2013-04-15 MED ORDER — SODIUM CHLORIDE 0.9 % IR SOLN
Status: DC | PRN
  Administered 2013-04-15: 3000 mL

## 2013-04-15 MED ORDER — PROPOFOL 10 MG/ML IV BOLUS
INTRAVENOUS | Status: DC | PRN
  Administered 2013-04-15: 180 mg via INTRAVENOUS

## 2013-04-15 MED ORDER — METOCLOPRAMIDE HCL 5 MG/ML IJ SOLN: 10.0000 mg | Freq: Once | INTRAMUSCULAR | Status: DC | PRN

## 2013-04-15 SURGICAL SUPPLY — 43 items
BAG URO CATCHER STRL LF (DRAPE) ×2 IMPLANT
BANDAGE GAUZE ELAST BULKY 4 IN (GAUZE/BANDAGES/DRESSINGS) ×2 IMPLANT
BLADE SURG 15 STRL LF DISP TIS (BLADE) ×4 IMPLANT
BLADE SURG 15 STRL SS (BLADE)
CANISTER SUCTION 2500CC (MISCELLANEOUS) ×2 IMPLANT
CLOTH BEACON ORANGE TIMEOUT ST (SAFETY) ×3 IMPLANT
CONT SPEC 4OZ CLIKSEAL STRL BL (MISCELLANEOUS) ×2 IMPLANT
CONT SPEC STER OR (MISCELLANEOUS) ×1 IMPLANT
DRSG PAD ABDOMINAL 8X10 ST (GAUZE/BANDAGES/DRESSINGS) ×5 IMPLANT
ELECT REM PT RETURN 9FT ADLT (ELECTROSURGICAL) ×3
ELECTRODE REM PT RTRN 9FT ADLT (ELECTROSURGICAL) ×2 IMPLANT
GAUZE XEROFORM 1X8 LF (GAUZE/BANDAGES/DRESSINGS) ×1 IMPLANT
GLOVE BIO SURGEON STRL SZ 6 (GLOVE) ×2 IMPLANT
GLOVE BIO SURGEON STRL SZ7 (GLOVE) ×1 IMPLANT
GLOVE BIO SURGEON STRL SZ7.5 (GLOVE) ×4 IMPLANT
GLOVE BIOGEL PI IND STRL 6.5 (GLOVE) IMPLANT
GLOVE BIOGEL PI IND STRL 7.5 (GLOVE) IMPLANT
GLOVE BIOGEL PI INDICATOR 6.5 (GLOVE) ×1
GLOVE BIOGEL PI INDICATOR 7.5 (GLOVE) ×1
GLOVE ORTHO TXT STRL SZ7.5 (GLOVE) ×3 IMPLANT
GOWN PREVENTION PLUS XLARGE (GOWN DISPOSABLE) ×3 IMPLANT
GOWN SRG XL XLNG 56XLVL 4 (GOWN DISPOSABLE) IMPLANT
GOWN STRL NON-REIN XL XLG LVL4 (GOWN DISPOSABLE) ×3
GOWN STRL REIN 2XL LVL4 (GOWN DISPOSABLE) ×1 IMPLANT
GOWN STRL REIN 3XL LVL4 (GOWN DISPOSABLE) IMPLANT
KIT BASIN OR (CUSTOM PROCEDURE TRAY) ×3 IMPLANT
KIT ROOM TURNOVER OR (KITS) ×3 IMPLANT
LEGGING LITHOTOMY PAIR STRL (DRAPES) ×1 IMPLANT
MANIFOLD NEPTUNE II (INSTRUMENTS) ×1 IMPLANT
NS IRRIG 1000ML POUR BTL (IV SOLUTION) ×5 IMPLANT
PACK CYSTO (CUSTOM PROCEDURE TRAY) ×2 IMPLANT
PACK GENERAL/GYN (CUSTOM PROCEDURE TRAY) ×3 IMPLANT
PAD ARMBOARD 7.5X6 YLW CONV (MISCELLANEOUS) ×6 IMPLANT
SOLUTION BETADINE 4OZ (MISCELLANEOUS) ×3 IMPLANT
SPONGE GAUZE 4X4 12PLY (GAUZE/BANDAGES/DRESSINGS) ×3 IMPLANT
SPONGE LAP 18X18 X RAY DECT (DISPOSABLE) ×1 IMPLANT
SUT CHROMIC 3 0 SH 27 (SUTURE) ×2 IMPLANT
SWAB COLLECTION DEVICE MRSA (MISCELLANEOUS) ×3 IMPLANT
TOWEL OR 17X24 6PK STRL BLUE (TOWEL DISPOSABLE) ×3 IMPLANT
TOWEL OR 17X26 10 PK STRL BLUE (TOWEL DISPOSABLE) ×3 IMPLANT
TRAY FOLEY CATH 16FR SILVER (SET/KITS/TRAYS/PACK) ×1 IMPLANT
TUBE ANAEROBIC SPECIMEN COL (MISCELLANEOUS) ×1 IMPLANT
WATER STERILE IRR 1000ML POUR (IV SOLUTION) ×2 IMPLANT

## 2013-04-15 NOTE — Progress Notes (Signed)
[facility] PROGRESS NOTE  Xavier Khan ZOX:[id number] DOB: [birth year] DOA: 04/13/2013 PCP: No PCP Per Patient  Assessment/Plan:  1. Acute on chronic Hidradenitis suppuritiva of the groin area with multifocal abscesses with scrotal involvement.      -recent debridement of groin 5/31      -continue vancomycin and Zosyn Day 2      -CCS/URology following      -plan for scrotal debridement today      -Dr.Tannenbaum d/w pts sister/ PoA yesterday  2. History of CAD status post stenting      -stable . 3. Schizophrenia -      - continue Seroquel/Risperdone      - cogentin stopped      -Sister is PoA, pt does not have capacity to make decisions  4. History of CVA with left-sided hemiplegia - on antiplatelet agents.      -DC Asa/continue plavix  5. Chronic anemia        -due to blood loss from 1, chronic disease stable  6. History of gout - continue allopurinol.  7. History of colon cancer status post hemicolectomy  8.   DM: SSI for now  DVt proph: SCDs  Code status: DNR Called and updated sister Xavier Khan   Consultants:  CC[facility]  Urology  Antibiotics:  VAnc 6/6  Zosyn 6/6  HPI/Subjective: No new complaints, says that he does not want surgery  Objective: Filed Vitals:   04/15/13 1000 04/15/13 1015 04/15/13 1027 04/15/13 1030  BP: 113/69 109/69 128/74 116/61  Pulse: 96 100 52 101  Temp:  97.4 F (36.3 C) 96.6 F (35.9 C)   TempSrc:   Oral   Resp: 13 16 22 20   Height:      Weight:      SpO2: 99% 100% 84% 99%    Intake/Output Summary (Last 24 hours) at 04/15/13 1040 Last data filed at 04/15/13 0943  Gross per 24 hour  Intake   1470 ml  Output   1525 ml  Net    -55 ml   Filed Weights   04/13/13 0300 04/15/13 0408  Weight: 55.1 kg (121 lb 7.6 oz) 55 kg (121 lb 4.1 oz)    Exam:   General:  Alert, awake, oriented to self, place  Cardiovascular: S1S2/RRR  Respiratory: CTAB  Abdomen: soft, NT, BS present  Musculoskeletal: no edema  c/c  Perineum:  Medial thigh wounds,  Base of penis and Scrotal thickening with purulent drainage covered with WTD dressing  Data Reviewed: Basic Metabolic Panel:  Recent Labs Lab 04/13/13 0420 04/13/13 1615 04/14/13 0550  NA 126* 133* 136  K 6.0* 3.7 3.6  CL 92* 96 98  CO2 23 30 30   GLUCOSE 213* 240* 126*  BUN 9 9 7   CREATININE 0.40* 0.46* 0.62  CALCIUM 8.8 8.4 8.9   Liver Function Tests:  Recent Labs Lab 04/13/13 0420  AST 44*  ALT 29  ALKPHOS 229*  BILITOT 0.4  PROT 10.1*  ALBUMIN 1.7*   No results found for this basename: LIPASE, AMYLASE,  in the last 168 hours No results found for this basename: AMMONIA,  in the last 168 hours CBC:  Recent Labs Lab 04/13/13 0708 04/14/13 0550  WBC 16.5* 16.5*  NEUTROABS 14.3*  --   HGB 8.6* 8.7*  HCT 26.2* 27.0*  MCV 91.9 93.8  PLT 453* 461*   Cardiac Enzymes: No results found for this basename: CKTOTAL, CKMB, CKMBINDEX, TROPONINI,  in the last 168 hours BNP (last 3 results) No results  found for this basename: PROBNP,  in the last 8760 hours CBG:  Recent Labs Lab 04/14/13 0758 04/14/13 1204 04/14/13 1740 04/14/13 2112 04/15/13 0955  GLUCAP 130* 281* 267* 219* 157*    Recent Results (from the past 240 hour(s))  CULTURE, ROUTINE-ABSCESS     Status: None   Collection Time    04/07/13 11:14 AM      Result Value Range Status   Specimen Description ABSCESS LEFT GROIN   Final   Special Requests PATIENT ON FOLLOWING AUGMENTIN   Final   Gram Stain     Final   Value: NO WBC SEEN     NO SQUAMOUS EPITHELIAL CELLS SEEN     NO ORGANISMS SEEN   Culture NO GROWTH 3 DAYS   Final   Report Status 04/10/2013 FINAL   Final  ANAEROBIC CULTURE     Status: None   Collection Time    04/07/13 11:14 AM      Result Value Range Status   Specimen Description ABSCESS LEFT GROIN   Final   Special Requests PATIENT ON FOLLOWING AUGMENTIN   Final   Gram Stain     Final   Value: NO WBC SEEN     NO SQUAMOUS EPITHELIAL CELLS SEEN      NO ORGANISMS SEEN   Culture MULTIPLE ORGANISMS PRESENT, NONE PREDOMINANT   Final   Report Status 04/13/2013 FINAL   Final  MRSA PCR SCREENING     Status: None   Collection Time    04/13/13  3:29 AM      Result Value Range Status   MRSA by PCR NEGATIVE  NEGATIVE Final   Comment:            The GeneXpert MRSA Assay (FDA     approved for NASAL specimens     only), is one component of a     comprehensive MRSA colonization     surveillance program. It is not     intended to diagnose MRSA     infection nor to guide or     monitor treatment for     MRSA infections.  CULTURE, BLOOD (ROUTINE X 2)     Status: None   Collection Time    04/13/13  4:15 AM      Result Value Range Status   Specimen Description BLOOD RIGHT HAND   Final   Special Requests BOTTLES DRAWN AEROBIC ONLY 5CC   Final   Culture  Setup Time 04/13/2013 09:36   Final   Culture     Final   Value:        BLOOD CULTURE RECEIVED NO GROWTH TO DATE CULTURE WILL BE HELD FOR 5 DAYS BEFORE ISSUING A FINAL NEGATIVE REPORT   Report Status PENDING   Incomplete  CULTURE, BLOOD (ROUTINE X 2)     Status: None   Collection Time    04/13/13  4:40 AM      Result Value Range Status   Specimen Description BLOOD RIGHT HAND   Final   Special Requests BOTTLES DRAWN AEROBIC ONLY 3CC   Final   Culture  Setup Time 04/13/2013 09:36   Final   Culture     Final   Value:        BLOOD CULTURE RECEIVED NO GROWTH TO DATE CULTURE WILL BE HELD FOR 5 DAYS BEFORE ISSUING A FINAL NEGATIVE REPORT   Report Status PENDING   Incomplete     Studies: No results found.  Scheduled Meds: . allopurinol  300 mg Oral Daily  . aspirin EC  81 mg Oral Daily  . atorvastatin  80 mg Oral Daily  . clopidogrel  75 mg Oral Q breakfast  . feeding supplement  237 mL Oral BID BM  . ferrous sulfate  325 mg Oral BID  . gabapentin  600 mg Oral QHS  . insulin aspart  0-15 Units Subcutaneous TID WC  . magnesium oxide  400 mg Oral TID  . mirtazapine  15 mg Oral QHS  .  multivitamin with minerals  1 tablet Oral Daily  . piperacillin-tazobactam (ZOSYN)  IV  3.375 g Intravenous Q8H  . polyethylene glycol  17 g Oral Daily  . QUEtiapine  200 mg Oral QHS  . risperiDONE  3 mg Oral BID  . sodium chloride  3 mL Intravenous Q12H  . vancomycin  1,000 mg Intravenous Q24H   Continuous Infusions:   Principal Problem:   Cellulitis Active Problems:   CAD (coronary artery disease)   Hidradenitis suppurativa   Schizophrenia   History of CVA (cerebrovascular accident)   Anemia   Gout   Malnutrition of moderate degree    Time spent:62min    [facility]  [facility] Pager [phone]. If 7PM-7AM, please contact night-coverage at [email or web address], password [facility] 04/15/2013, 10:40 AM  LOS: 2 days

## 2013-04-15 NOTE — Anesthesia Procedure Notes (Signed)
Procedure Name: LMA Insertion Date/Time: 04/15/2013 8:17 AM Performed by: Alanda Amass A Pre-anesthesia Checklist: Patient identified, Timeout performed, Emergency Drugs available, Patient being monitored and Suction available Patient Re-evaluated:Patient Re-evaluated prior to inductionOxygen Delivery Method: Circle system utilized Preoxygenation: Pre-oxygenation with 100% oxygen Intubation Type: IV induction LMA: LMA inserted LMA Size: 4.0 Number of attempts: 1 Placement Confirmation: positive ETCO2 and breath sounds checked- equal and bilateral Tube secured with: Tape Dental Injury: Teeth and Oropharynx as per pre-operative assessment

## 2013-04-15 NOTE — Preoperative (Signed)
Beta Blockers   Reason not to administer Beta Blockers:Not Applicable 

## 2013-04-15 NOTE — H&P (View-Only) (Signed)
Urology Progress Note    Subjective: Hidrinitis. Case discussed with  Dr. Erby Pian and [facility]. Pt seems to be healing his GS wounds. My concern is that he is a diabetic,. And a NCB, and we are putting him through surgery with poor recoveraility, and questionable quality of life recovery. [facility] has already discussed possibility of palliative care, and reports that sister wants most aggressive care possible-except for code status.    No acute urologic events overnight. Ambulation:   negative Flatus:    negative Bowel movement  negative  Pain: complete resolution  Objective:  Blood pressure 110/61, pulse 102, temperature 97.9 F (36.6 C), temperature source Oral, resp. rate 17, height 5\' 4"  (1.626 m), weight 55.1 kg (121 lb 7.6 oz), SpO2 100.00%.  Physical Exam:  General:  No acute distress, awake Extremities: extremities normal, atraumatic, no cyanosis or edema Genitourinary:  nochange Foley: none    I/O last 3 completed shifts: In: 2548 [P.O.:120; I.V.:2278; IV Piggyback:150] Out: 1025 [Urine:1025]  Recent Labs     04/13/13  0708  04/14/13  0550  HGB  8.6*  8.7*  WBC  16.5*  16.5*  PLT  453*  461*    Recent Labs     04/13/13  1615  04/14/13  0550  NA  133*  136  K  3.7  3.6  CL  96  98  CO2  30  30  BUN  9  7  CREATININE  0.46*  0.62  CALCIUM  8.4  8.9  GFRNONAA  >90  >90  GFRAA  >90  >90     Recent Labs     04/13/13  0708  INR  1.25     No components found with this basename: ABG,   Assessment/Plan:  Continue any current medications.  Will plan for scrotal  I/D in AM . Nurse to get permit.( consent)  Case posted.

## 2013-04-15 NOTE — Progress Notes (Signed)
Paged Jomarie Longs, MD again regarding pt HR now in the mid 120s despite 650 mg tylenol and 1mg  dilaudid IV.  Pt afebrile, resps WNL, no distress, BP 128/59.  Pt does not seem to be in pain, although again has a flat affect.  Elevated HR likely related to being post-op.  No new orders received, will continue to monitor for other changes.  Salomon Mast, RN

## 2013-04-15 NOTE — Progress Notes (Signed)
Pt HR in the 110s (currently 117).  Pt denies pain, although has a flat affect.  Let Jomarie Longs, MD know that pt does have bleeding at incision site with a clot passing.  Orders received to get a CBC now, as well as IV dilaudid ordered. Will continue to monitor.  Salomon Mast, RN

## 2013-04-15 NOTE — Op Note (Signed)
Incision and Drainage bilateral perineal abscesses Procedure Note   Pre-operative Diagnosis: Chronic perineal/groin/scrotal hidradenitis with multifocal abscesses  Post-operative Diagnosis: same   Indications: continued drainage.  Anesthesia: General   Procedure Details  I was called intraoperatively to OR 9 to assess the patient that Dr. Patsi Sears was debriding.  He had prior I&D by Dr. Luisa Hart this admission.    The patient was under general anesthesia in lithotomy position. The skin was prepped with betadine and draped over the affected area in the usual fashion. Time out was performed according to the surgical safety checklist.   I&D with a #15 blade was performed on the bilateral perineal region.   Additional skin was debrided around the opening. Purulent drainage was present. The pulse lavage was used to irrigate the cavity. Multiple small abscesses on the left medial thigh already draining were opened with a #15 blade. These were punctate openings only and were too small to pack.  Pressure was held for diffuse oozing.  The perineal and scrotal wounds were packed with betadine soaked kerlix.  The patient was awakened from anesthesia and taken to the PACU in stable condition.   Findings:  Multifocal hidradenitis with multiple abscesses  EBL: 25 cc's   Drains: None   Condition: Tolerated procedure well   Complications:  none known.

## 2013-04-15 NOTE — Transfer of Care (Signed)
Immediate Anesthesia Transfer of Care Note  Patient: Xavier Khan  Procedure(s) Performed: Procedure(s) with comments: IRRIGATION AND DEBRIDEMENT ABSCESS (N/A) - scrotum and penis INCISION AND DRAINAGE ABSCESS DRESSING CHANGE UNDER ANESTHESIA  Patient Location: PACU  Anesthesia Type:General  Level of Consciousness: sedated  Airway & Oxygen Therapy: Patient Spontanous Breathing and Patient connected to nasal cannula oxygen  Post-op Assessment: Report given to PACU RN and Post -op Vital signs reviewed and stable  Post vital signs: Reviewed and stable  Complications: No apparent anesthesia complications

## 2013-04-15 NOTE — Anesthesia Postprocedure Evaluation (Signed)
Anesthesia Post Note  Patient: Xavier Khan  Procedure(s) Performed: Procedure(s) (LRB): IRRIGATION AND DEBRIDEMENT ABSCESS (N/A) INCISION AND DRAINAGE ABSCESS DRESSING CHANGE UNDER ANESTHESIA  Anesthesia type: general  Patient location: PACU  Post pain: Pain level controlled  Post assessment: Patient's Cardiovascular Status Stable  Last Vitals:  Filed Vitals:   04/15/13 1030  BP: 116/61  Pulse: 101  Temp:   Resp: 20    Post vital signs: Reviewed and stable  Level of consciousness: sedated  Complications: No apparent anesthesia complications

## 2013-04-15 NOTE — Progress Notes (Signed)
Paged Dr. Patsi Sears regarding pt scrotal and perineum incision area.  Made him aware that when putting pt on bedpan that the dressings were soaked with blood and that a big clot fell out as well.  MD says this is normal for this procedure.  Also let him know that I took the blood soaked ABD pad off and put a new one on.  The actual packing at the incision sites was intact, so left alone.  MD says to change/reinforce dressing PRN for soiling.  Per Darl Pikes, PT, will be by tomorrow to perform hydrotherapy as ordered.  Will continue to monitor.  Salomon Mast, RN

## 2013-04-15 NOTE — Anesthesia Preprocedure Evaluation (Addendum)
Anesthesia Evaluation  Patient identified by MRN, date of birth, ID band Patient awake    Reviewed: Allergy & Precautions, H&P , NPO status , Patient's Chart, lab work & pertinent test results, reviewed documented beta blocker date and time   Airway Mallampati: II TM Distance: >3 FB Neck ROM: full    Dental  (+) Dental Advisory Given   Pulmonary Current Smoker,  breath sounds clear to auscultation        Cardiovascular + angina + CAD, + Past MI and + Cardiac Stents Rhythm:regular     Neuro/Psych PSYCHIATRIC DISORDERS Schizophrenia CVA, Residual Symptoms    GI/Hepatic negative GI ROS, Neg liver ROS,   Endo/Other  diabetes, Insulin Dependent  Renal/GU negative Renal ROS  negative genitourinary   Musculoskeletal   Abdominal   Peds  Hematology negative hematology ROS (+) anemia ,   Anesthesia Other Findings See surgeon's H&P   Reproductive/Obstetrics negative OB ROS                          Anesthesia Physical Anesthesia Plan  ASA: III  Anesthesia Plan: General   Post-op Pain Management:    Induction: Intravenous  Airway Management Planned: LMA and Oral ETT  Additional Equipment:   Intra-op Plan:   Post-operative Plan: Extubation in OR  Informed Consent: I have reviewed the patients History and Physical, chart, labs and discussed the procedure including the risks, benefits and alternatives for the proposed anesthesia with the patient or authorized representative who has indicated his/her understanding and acceptance.   Dental Advisory Given  Plan Discussed with: CRNA and Surgeon  Anesthesia Plan Comments:         Anesthesia Quick Evaluation

## 2013-04-15 NOTE — Interval H&P Note (Signed)
History and Physical Interval Note:  04/15/2013 8:07 AM  Xavier Khan  has presented today for surgery, with the diagnosis of Scrotal Abscess  The various methods of treatment have been discussed with the patient and family. After consideration of risks, benefits and other options for treatment, the patient has consented to  Procedure(s): IRRIGATION AND DEBRIDEMENT ABSCESS (N/A) as a surgical intervention .  The patient's history has been reviewed, patient examined, no change in status, stable for surgery.  I have reviewed the patient's chart and labs.  Questions were answered to the patient's satisfaction.     Jethro Bolus I

## 2013-04-15 NOTE — Op Note (Signed)
Pre-operative diagnosis :   Hidradenitis suppurativa hands of the left hemiscrotum  Postoperative diagnosis:  Same plus penile skin abrasion (from condom catheter)  Operation:  1. Removal of condom catheter and placement of 16 French Foley catheter (5 cc balloon)  2. Scrotal expiration with excision of multiple scrotal necrotic areas consistent with hidradenitis.  Surgeon:  Kathie Rhodes. Patsi Sears, MD  First assistant:  None  Anesthesia:  General endotracheal  Preparation:  After appropriate preanesthesia, the patient was brought to the operating room, placed upon the operating table in the dorsal supine position where general endotracheal anesthesia was introduced. He was replaced in the dorsal lithotomy position with pubis was prepped with Betadine solution draped in usual fashion. Catheter was removed. Generalized inspection revealed that the patient had bruising of the base of the penis in the ventral position, overlying the urethra. I therefore elected to place 16 Foley catheter, and not replaced the condom catheter. This was accomplished with 5 cc in the balloon without difficulty.  The armband was double checked and the history was reviewed.  The patient received vancomycin and zosyn.   Review history:  Mr. Dahl Higinbotham, a 57 y.o. diabetic male with known history of hidranitis suppuritiva, CAD status post stenting, colon cancer status post surgery last year, CVA with left-sided hemiplegia, schizophrenia, ( sister is POA) chronic anemia was found to have increasing bleeding from his groin area where patient has hidranitis suppuritiva. He has Stage 3 buttock decubitus, and is post I/D bilateral armpit and inguinal I/D of abscesses.  As per patient's sister, with whom patient lives, patient was being treated with multiple does of antibiotics over the last one month due to increased drainage from the groin area. Patient also has been having slow blood loss from the area. He was admitted to the hospital  with more than usual blood loss and patient was felt to be weak. On arrival in the ER patient was found to be tachycardic and tachypneic and it improved with fluids. On exam patient had increase drainage from the groin area with blood. On-call surgeon was consulted and at this time patient has been admitted for worsening of is hidradenitis suppurativa. CT abdomen and pelvis neg , except for decubitus ulcer. He is a DNR.  Pt now post bilateral axillary I/D, and bilateral groin I/D. Cultures negative to date. Urology consult for thickened scrotum with possible infection. ( sister is POA). On wet-to-dry dressings.  Continued scrotal supperative drainage. Case discussed with  [facility], Dr. Erby Pian, and pt's sister, and will plan for scrotal I/D and excision of necrotic tissue.      Statement of  Likelihood of Success: Excellent. TIME-OUT observed.:  Procedure:  Further examination of the scrotum revealed multiple subcutaneous necrotic areas consistent with hidradenitis. Pus was delivered into the wound, and cultured for aerobic and anaerobic cultures. Incision was made the scrotum, and a large portion of necrotic left hemiscrotum was removed, with excision of large necrotic areas. There appeared to be tracking of the infection into the left hemiscrotum, and each tract was dissected. The tracks did not dissect through the tunica vaginalis. Therefore it was felt that the testicle itself was protected. Following skin incision, necrotic skin excision, 3 L of normal saline pulse lavage was used area following this, bleeding was electrocoagulated.  Dr. Donell Beers from Gen. surgery then scrubbed into the case, in order to reevaluate general surgery wounds, in the perirectal area, in the groin, and in the axilla.

## 2013-04-15 NOTE — Progress Notes (Signed)
PT Cancellation Note  Patient Details Name: Xavier Khan MRN: [id number] DOB: May 01, 1956   Cancelled Treatment:    Reason Eval/Treat Not Completed: Patient at procedure or test/unavailable.  Received order for PT hydrotherapy.  Reviewed patient's chart - patient s/p I&D in OR today.  Will initiate hydrotherapy tomorrow.  Thank you.   Vena Austria 04/15/2013, 11:21 AM Durenda Hurt. Renaldo Fiddler, [facility] Acute Rehab Services Pager [phone]

## 2013-04-15 NOTE — Progress Notes (Signed)
Pt transported to OR, no tele, IV, or O2.  Pt went by bed.

## 2013-04-16 ENCOUNTER — Encounter (HOSPITAL_COMMUNITY): Payer: Self-pay | Admitting: Urology

## 2013-04-16 DIAGNOSIS — I959 Hypotension, unspecified: Secondary | ICD-10-CM | POA: Diagnosis present

## 2013-04-16 DIAGNOSIS — I2510 Atherosclerotic heart disease of native coronary artery without angina pectoris: Secondary | ICD-10-CM

## 2013-04-16 DIAGNOSIS — D649 Anemia, unspecified: Secondary | ICD-10-CM

## 2013-04-16 DIAGNOSIS — R1314 Dysphagia, pharyngoesophageal phase: Secondary | ICD-10-CM

## 2013-04-16 DIAGNOSIS — K5900 Constipation, unspecified: Secondary | ICD-10-CM | POA: Diagnosis present

## 2013-04-16 DIAGNOSIS — A419 Sepsis, unspecified organism: Secondary | ICD-10-CM

## 2013-04-16 DIAGNOSIS — Z515 Encounter for palliative care: Secondary | ICD-10-CM

## 2013-04-16 LAB — CBC
Hemoglobin: 6.7 g/dL — CL (ref 13.0–17.0)
MCH: 29.9 pg (ref 26.0–34.0)
MCHC: 31.9 g/dL (ref 30.0–36.0)
Platelets: 387 10*3/uL (ref 150–400)

## 2013-04-16 LAB — VANCOMYCIN, TROUGH: Vancomycin Tr: 7.6 ug/mL — ABNORMAL LOW (ref 10.0–20.0)

## 2013-04-16 LAB — GLUCOSE, CAPILLARY
Glucose-Capillary: 156 mg/dL — ABNORMAL HIGH (ref 70–99)
Glucose-Capillary: 149 mg/dL — ABNORMAL HIGH (ref 70–99)

## 2013-04-16 LAB — HEMOGLOBIN AND HEMATOCRIT, BLOOD
HCT: 25.5 % — ABNORMAL LOW (ref 39.0–52.0)
Hemoglobin: 8.6 g/dL — ABNORMAL LOW (ref 13.0–17.0)

## 2013-04-16 LAB — BASIC METABOLIC PANEL
Chloride: 98 mEq/L (ref 96–112)
Creatinine, Ser: 0.79 mg/dL (ref 0.50–1.35)
GFR calc Af Amer: >90 mL/min (ref >90)
GFR calc non Af Amer: >90 mL/min (ref >90)
Potassium: 4.6 mEq/L (ref 3.5–5.1)

## 2013-04-16 LAB — LACTIC ACID, PLASMA: Lactic Acid, Venous: 1.1 mmol/L (ref 0.5–2.2)

## 2013-04-16 MED ORDER — BIOTENE DRY MOUTH MT LIQD
15.0000 mL | Freq: Two times a day (BID) | OROMUCOSAL | Status: DC
  Administered 2013-04-16 – 2013-04-20 (×7): 15 mL via OROMUCOSAL

## 2013-04-16 MED ORDER — ACETAMINOPHEN 160 MG/5ML PO SOLN
650.0000 mg | Freq: Four times a day (QID) | ORAL | Status: DC | PRN
  Administered 2013-04-16: 650 mg via ORAL
  Filled 2013-04-16: qty 20.3

## 2013-04-16 MED ORDER — VANCOMYCIN HCL 10 G IV SOLR
1250.0000 mg | INTRAVENOUS | Status: DC
  Administered 2013-04-17 – 2013-04-19 (×3): 1250 mg via INTRAVENOUS
  Filled 2013-04-16 (×4): qty 1250

## 2013-04-16 MED ORDER — SODIUM CHLORIDE 0.9 % IV SOLN
INTRAVENOUS | Status: DC
  Administered 2013-04-16: 1000 mL via INTRAVENOUS
  Administered 2013-04-17: 14:00:00 via INTRAVENOUS
  Administered 2013-04-17: 125 mL/h via INTRAVENOUS
  Administered 2013-04-18: 06:00:00 via INTRAVENOUS
  Administered 2013-04-19: 1000 mL via INTRAVENOUS
  Administered 2013-04-20: 125 mL/h via INTRAVENOUS

## 2013-04-16 MED ORDER — SODIUM CHLORIDE 0.9 % IV BOLUS (SEPSIS)
250.0000 mL | Freq: Once | INTRAVENOUS | Status: AC
  Administered 2013-04-16: 250 mL via INTRAVENOUS

## 2013-04-16 NOTE — Progress Notes (Signed)
Patient refused blood drawn this morning.   Xavier Khan

## 2013-04-16 NOTE — Progress Notes (Signed)
Nurse contacted MD to inform that pt BP 74/40 taken in multiple positions. Nurse communicated temperature gathered orally due to packing and wound dressing per surgeon covering rectum, MD accepted temperature reading from oral site).  Nurse instructed nurse to administer blood immediately.

## 2013-04-16 NOTE — Progress Notes (Addendum)
  1026:  Surgical PA at bedside.  Nurse contacted primary MD with latest Hgb and to inform of current event.  1002:Nurse was called in pt room by PT to assess pt scrotum during ordered hydrotherapy.  Pt lower back side was saturated with blood.  PT voiced concerns regarding continuing therapy and requested that nurse contact Surgeon for assessment prior to continuing therapy.  Nurse contacted MD Blackmon as requested.

## 2013-04-16 NOTE — Progress Notes (Signed)
Thank you for consulting the Palliative Medicine Team at [facility] to meet your patient's and family's needs.   The reason that you asked [place] to see your patient is  For Goals of care  We have scheduled your patient for a meeting: 04/16/13@4 :30p  The Surrogate decision make ZO:XWRUE Spruill (patients sister)-HPOA Contact information:[phone]  Other family members that need to be present: [name] requested that multiple family members be in attendance-  Your patient is able/unable to participate:patient does not have decision making capacity (schizophrenia)  Additional Narrative: Sister Haze would prefer PMT meeting be held away from bedside  Jannette Fogo, Dyann Ruddle, CNS-C Palliative Medicine Team [facility] Health Team Phone: [phone] Pager: [phone]

## 2013-04-16 NOTE — Progress Notes (Signed)
1 Day Post-Op Subjective: Patient withdrawn and non-communicative this AM. Post op day 1 post scrotal excision and debridement, in association with General Surgery perineal and perirectal debridment. Pt had dressing change yesterday with bowel movement, and had blood clot in dressing, but now stable.   Objective: Vital signs in last 24 hours: Temp:  [96.6 F (35.9 C)-101.2 F (38.4 C)] 101.2 F (38.4 C) (06/09 0404) Pulse Rate:  [52-130] 118 (06/09 0404) Resp:  [13-28] 21 (06/09 0600) BP: (90-128)/(42-74) 96/46 mmHg (06/09 0600) SpO2:  [84 %-100 %] 98 % (06/09 0010) Weight:  [55 kg (121 lb 4.1 oz)] 55 kg (121 lb 4.1 oz) (06/09 0010)  Intake/Output from previous day: 06/08 0701 - 06/09 0700 In: 2401 [P.O.:1251; I.V.:900; IV Piggyback:250] Out: 2050 [Urine:2000; Blood:50] Intake/Output this shift:    Physical Exam:  General:no distress and slowed mentation GI: not done and soft, non tender, normal bowel sounds, no palpable masses, no organomegaly, no inguinal hernia Male genitalia: not done dressing dry and intact. dressing changes per nursing staff. For hydrotherapy.    Lab Results:  Recent Labs  04/14/13 0550 04/15/13 1604  HGB 8.7* 8.0*  HCT 27.0* 25.3*   BMET  Recent Labs  04/13/13 1615 04/14/13 0550  NA 133* 136  K 3.7 3.6  CL 96 98  CO2 30 30  GLUCOSE 240* 126*  BUN 9 7  CREATININE 0.46* 0.62  CALCIUM 8.4 8.9   No results found for this basename: LABPT, INR,  in the last 72 hours No results found for this basename: LABURIN,  in the last 72 hours Results for orders placed during the hospital encounter of 04/13/13  MRSA PCR SCREENING     Status: None   Collection Time    04/13/13  3:29 AM      Result Value Range Status   MRSA by PCR NEGATIVE  NEGATIVE Final   Comment:            The GeneXpert MRSA Assay (FDA     approved for NASAL specimens     only), is one component of a     comprehensive MRSA colonization     surveillance program. It is not    intended to diagnose MRSA     infection nor to guide or     monitor treatment for     MRSA infections.  CULTURE, BLOOD (ROUTINE X 2)     Status: None   Collection Time    04/13/13  4:15 AM      Result Value Range Status   Specimen Description BLOOD RIGHT HAND   Final   Special Requests BOTTLES DRAWN AEROBIC ONLY 5CC   Final   Culture  Setup Time 04/13/2013 09:36   Final   Culture     Final   Value:        BLOOD CULTURE RECEIVED NO GROWTH TO DATE CULTURE WILL BE HELD FOR 5 DAYS BEFORE ISSUING A FINAL NEGATIVE REPORT   Report Status PENDING   Incomplete  CULTURE, BLOOD (ROUTINE X 2)     Status: None   Collection Time    04/13/13  4:40 AM      Result Value Range Status   Specimen Description BLOOD RIGHT HAND   Final   Special Requests BOTTLES DRAWN AEROBIC ONLY 3CC   Final   Culture  Setup Time 04/13/2013 09:36   Final   Culture     Final   Value:        BLOOD CULTURE  RECEIVED NO GROWTH TO DATE CULTURE WILL BE HELD FOR 5 DAYS BEFORE ISSUING A FINAL NEGATIVE REPORT   Report Status PENDING   Incomplete  ANAEROBIC CULTURE     Status: None   Collection Time    04/15/13  8:00 AM      Result Value Range Status   Specimen Description ABSCESS LEFT GROIN   Final   Special Requests PATIENT ON FOLLOWING ZOSYN VANCOMYCIN   Final   Gram Stain     Final   Value: MODERATE WBC PRESENT, PREDOMINANTLY PMN     NO SQUAMOUS EPITHELIAL CELLS SEEN     NO ORGANISMS SEEN   Culture PENDING   Incomplete   Report Status PENDING   Incomplete  CULTURE, ROUTINE-ABSCESS     Status: None   Collection Time    04/15/13  8:00 AM      Result Value Range Status   Specimen Description ABSCESS LEFT GROIN   Final   Special Requests PATIENT ON FOLLOWING ZOSYN VANCOMYCIN   Final   Gram Stain     Final   Value: MODERATE WBC PRESENT, PREDOMINANTLY PMN     NO SQUAMOUS EPITHELIAL CELLS SEEN     NO ORGANISMS SEEN   Culture PENDING   Incomplete   Report Status PENDING   Incomplete    Studies/Results: No results  found.  Assessment/Plan: Continue foley due to healing sacral/perineal ulcers   Continued dressing changes. Guarded prognosis.Discussed with sister yesterday.    LOS: 3 days   Stefannie Defeo I 04/16/2013, 7:20 AM

## 2013-04-16 NOTE — Progress Notes (Addendum)
[facility] PROGRESS NOTE  [name] AOZ:[id number] DOB: 1956-03-11 DOA: 04/13/2013 PCP: No PCP Per Patient  Brief Narrative Xavier Khan is a 57/M with schizophrenia, mild dementia, DM, CVA, CAD, Colon cancer s/p hemicolectomy, chronic hydradenitis was discharged 2 prior to admission from our facility after being treated for acute on chronic worsening of hidradenitis suppurativa of the groin area and had I&D done by surgery on 5/31 was placed on antibiotics and discharged to nursing home was found to have increasing discharge with leukocytosis and initially mildly hypotensive with tachycardia.  On evaluation he had multiple groin wounds now extending to the scrotum, with small openings and discharge. He was seen by CCS and URology in consultation and after multiple discussions with his sister [facility], he was taken to the OR and underwent Scrotal expiration with excision of multiple scrotal necrotic areas consistent with hidradenitis by Dr.tannenbaum and debridement by dr.Byerly of the perineal small multifocal abscesses. Post op today he is febrile and lethargic with bleeding from his wounds. Continues to be on broad spectrum abx and is getting PRBC now  Assessment/Plan:  1. Acute on chronic Hidradenitis suppuritiva of the groin area with multifocal abscesses with scrotal involvement.      -recent debridement of groin 5/31      -continue vancomycin and Zosyn Day 3      -[facility]/URology following      -s/p scrotal debridement 6/8 and Perineal debridement as well by [facility]CS      -Dr.Tannenbaum d/w pts sister/ PoA pre op       -now with s/s of sepsis again, check blood Cx x2, IVF, lactic acid      -Given poor prognosis and recurrent abscesses with hydradenitis and high likelihood of requiring multiple surgeries in future, d/w sister and agreeable for a Palliative consult for Goals of care      -Sister is agreeable to this now, unlike 2 days ago  2. Sepsis: due to 1 -abx, management as noted  above  3. History of CAD status post stenting      -stable . 4. Schizophrenia -      - continue Seroquel/Risperdone      - cogentin stopped      -Sister is PoA, pt does not have capacity to make decisions  5. History of CVA with left-sided hemiplegia - on antiplatelet agents.      -DC Asa/continue plavix  6. Chronic anemia        -due to blood loss from 1, chronic disease        -CBC pending this am, expect this to be low given post op and oozing from wounds, transfuse if PRBC <7  7. History of gout - continue allopurinol.  8. History of colon cancer status post hemicolectomy  9.  DM: SSI for now  DVt proph: SCDs  Code status: DNR Called and updated sister [name] today Disposition: keep in SDU   Consultants:  CCS  Urology  Antibiotics:  VAnc 6/6  Zosyn 6/6  HPI/Subjective: No new complaints, lethargic this am, oozing from wounds per RN  Objective: Filed Vitals:   04/16/13 0010 04/16/13 0404 04/16/13 0600 04/16/13 0700  BP: 92/46 90/47 96/46  97/43  Pulse: 120 118  117  Temp: 99.3 F (37.4 C) 101.2 F (38.4 C)  99.2 F (37.3 C)  TempSrc: Oral Axillary  Axillary  Resp: 23 20 21 26   Height:      Weight: 55 kg (121 lb 4.1 oz)     SpO2: 98%  97%    Intake/Output Summary (Last 24 hours) at 04/16/13 0805 Last data filed at 04/16/13 0801  Gross per 24 hour  Intake   2401 ml  Output   2150 ml  Net    251 ml   Filed Weights   04/13/13 0300 04/15/13 0408 04/16/13 0010  Weight: 55.1 kg (121 lb 7.6 oz) 55 kg (121 lb 4.1 oz) 55 kg (121 lb 4.1 oz)    Exam:   General:  Lethargic, arousible, oriented to self only  Cardiovascular: S1S2/RRR  Respiratory: CTAB  Abdomen: soft, NT, BS present  Musculoskeletal: no edema c/c  Perineum:  Medial thigh wounds,  Base of penis and Scrotal thickening with bleeding/purulent drainage covered with WTD dressing  Axilla: with hydradenitis with minimal purulence  Data Reviewed: Basic Metabolic Panel:  Recent  Labs Lab 04/13/13 0420 04/13/13 1615 04/14/13 0550  NA 126* 133* 136  K 6.0* 3.7 3.6  CL 92* 96 98  CO2 23 30 30   GLUCOSE 213* 240* 126*  BUN 9 9 7   CREATININE 0.40* 0.46* 0.62  CALCIUM 8.8 8.4 8.9   Liver Function Tests:  Recent Labs Lab 04/13/13 0420  AST 44*  ALT 29  ALKPHOS 229*  BILITOT 0.4  PROT 10.1*  ALBUMIN 1.7*   No results found for this basename: LIPASE, AMYLASE,  in the last 168 hours No results found for this basename: AMMONIA,  in the last 168 hours CBC:  Recent Labs Lab 04/13/13 0708 04/14/13 0550 04/15/13 1604  WBC 16.5* 16.5* 16.7*  NEUTROABS 14.3*  --   --   HGB 8.6* 8.7* 8.0*  HCT 26.2* 27.0* 25.3*  MCV 91.9 93.8 94.8  PLT 453* 461* 392   Cardiac Enzymes: No results found for this basename: CKTOTAL, CKMB, CKMBINDEX, TROPONINI,  in the last 168 hours BNP (last 3 results) No results found for this basename: PROBNP,  in the last 8760 hours CBG:  Recent Labs Lab 04/14/13 2112 04/15/13 0955 04/15/13 1131 04/15/13 1700 04/15/13 2122  GLUCAP 219* 157* 239* 345* 196*    Recent Results (from the past 240 hour(s))  CULTURE, ROUTINE-ABSCESS     Status: None   Collection Time    04/07/13 11:14 AM      Result Value Range Status   Specimen Description ABSCESS LEFT GROIN   Final   Special Requests PATIENT ON FOLLOWING AUGMENTIN   Final   Gram Stain     Final   Value: NO WBC SEEN     NO SQUAMOUS EPITHELIAL CELLS SEEN     NO ORGANISMS SEEN   Culture NO GROWTH 3 DAYS   Final   Report Status 04/10/2013 FINAL   Final  ANAEROBIC CULTURE     Status: None   Collection Time    04/07/13 11:14 AM      Result Value Range Status   Specimen Description ABSCESS LEFT GROIN   Final   Special Requests PATIENT ON FOLLOWING AUGMENTIN   Final   Gram Stain     Final   Value: NO WBC SEEN     NO SQUAMOUS EPITHELIAL CELLS SEEN     NO ORGANISMS SEEN   Culture MULTIPLE ORGANISMS PRESENT, NONE PREDOMINANT   Final   Report Status 04/13/2013 FINAL   Final   MRSA PCR SCREENING     Status: None   Collection Time    04/13/13  3:29 AM      Result Value Range Status   MRSA by PCR NEGATIVE  NEGATIVE Final  Comment:            The GeneXpert MRSA Assay (FDA     approved for NASAL specimens     only), is one component of a     comprehensive MRSA colonization     surveillance program. It is not     intended to diagnose MRSA     infection nor to guide or     monitor treatment for     MRSA infections.  CULTURE, BLOOD (ROUTINE X 2)     Status: None   Collection Time    04/13/13  4:15 AM      Result Value Range Status   Specimen Description BLOOD RIGHT HAND   Final   Special Requests BOTTLES DRAWN AEROBIC ONLY 5CC   Final   Culture  Setup Time 04/13/2013 09:36   Final   Culture     Final   Value:        BLOOD CULTURE RECEIVED NO GROWTH TO DATE CULTURE WILL BE HELD FOR 5 DAYS BEFORE ISSUING A FINAL NEGATIVE REPORT   Report Status PENDING   Incomplete  CULTURE, BLOOD (ROUTINE X 2)     Status: None   Collection Time    04/13/13  4:40 AM      Result Value Range Status   Specimen Description BLOOD RIGHT HAND   Final   Special Requests BOTTLES DRAWN AEROBIC ONLY 3CC   Final   Culture  Setup Time 04/13/2013 09:36   Final   Culture     Final   Value:        BLOOD CULTURE RECEIVED NO GROWTH TO DATE CULTURE WILL BE HELD FOR 5 DAYS BEFORE ISSUING A FINAL NEGATIVE REPORT   Report Status PENDING   Incomplete  ANAEROBIC CULTURE     Status: None   Collection Time    04/15/13  8:00 AM      Result Value Range Status   Specimen Description ABSCESS LEFT GROIN   Final   Special Requests PATIENT ON FOLLOWING ZOSYN VANCOMYCIN   Final   Gram Stain     Final   Value: MODERATE WBC PRESENT, PREDOMINANTLY PMN     NO SQUAMOUS EPITHELIAL CELLS SEEN     NO ORGANISMS SEEN   Culture PENDING   Incomplete   Report Status PENDING   Incomplete  CULTURE, ROUTINE-ABSCESS     Status: None   Collection Time    04/15/13  8:00 AM      Result Value Range Status   Specimen  Description ABSCESS LEFT GROIN   Final   Special Requests PATIENT ON FOLLOWING ZOSYN VANCOMYCIN   Final   Gram Stain     Final   Value: MODERATE WBC PRESENT, PREDOMINANTLY PMN     NO SQUAMOUS EPITHELIAL CELLS SEEN     NO ORGANISMS SEEN   Culture Culture reincubated for better growth   Final   Report Status PENDING   Incomplete     Studies: No results found.  Scheduled Meds: . allopurinol  300 mg Oral Daily  . atorvastatin  80 mg Oral Daily  . clopidogrel  75 mg Oral Q breakfast  . feeding supplement  237 mL Oral BID BM  . ferrous sulfate  325 mg Oral BID  . gabapentin  600 mg Oral QHS  . insulin aspart  0-15 Units Subcutaneous TID WC  . magnesium oxide  400 mg Oral TID  . mirtazapine  15 mg Oral QHS  . multivitamin with minerals  1 tablet Oral Daily  . piperacillin-tazobactam (ZOSYN)  IV  3.375 g Intravenous Q8H  . polyethylene glycol  17 g Oral Daily  . QUEtiapine  200 mg Oral QHS  . risperiDONE  3 mg Oral BID  . sodium chloride  3 mL Intravenous Q12H  . vancomycin  1,000 mg Intravenous Q24H   Continuous Infusions: . sodium chloride      Principal Problem:   Cellulitis Active Problems:   CAD (coronary artery disease)   Hidradenitis suppurativa   Schizophrenia   History of CVA (cerebrovascular accident)   Anemia   Gout   Malnutrition of moderate degree    Time spent:17min    [facility]  [facility] Pager [phone]. If 7PM-7AM, please contact night-coverage at [email or web address], password [email or web address] 04/16/2013, 8:05 AM  LOS: 3 days

## 2013-04-16 NOTE — Progress Notes (Signed)
Clinical Social Work Department BRIEF PSYCHOSOCIAL ASSESSMENT 04/16/2013  Patient:  Xavier, Khan     Account Number:  [id number]     Admit date:  04/13/2013  Clinical Social Worker:  Margaree Mackintosh  Date/Time:  04/16/2013 04:33 PM  Referred by:  Care Management  Date Referred:  04/16/2013 Referred for  SNF Placement   Other Referral:   Pt admitted from [facility].   Interview type:  Family Other interview type:   Pt currently not fully oriented.    PSYCHOSOCIAL DATA Living Status:  FACILITY Admitted from facility:  [facility] [place] Level of care:  Skilled Nursing Facility Primary support name:  Xavier Khan: [phone] Primary support relationship to patient:  S[place] Degree of support available:   Adequate.    CURRENT CONCERNS Current Concerns  Post-Acute Placement   Other Concerns:    SOCIAL WORK ASSESSMENT / PLAN Clinical Social Worker received referral indicating pt is from [facility].  CSW received phone call from [place] at [facility]. Lillia Abed ([phone]) requested this CSW hard fax pt's medical information to their office. CSW stated that without permission from family or pt, this CSW would not send information over; Lillia Abed stated understanding.  CSW spoke with RN who stated pt is not fully oriented.  CSW spoke with pt's sister, Xavier Khan, who stated she was not "happy" with [facility] and has grievances with the program.  CSW encouraged sister to discuss grievances with [facility].  Sister meeting today with Palliative Medicine Team to discuss goals of care.  CSW offered support to sister and offered contact information so sister could call tomorrow after family has had time to meet with PMT and consider options.  Sister thanked CSW for intervention.   Assessment/plan status:  Information/Referral to [facility] Other assessment/ plan:   Information/referral to community resources:   SNF  [facility]    PATIENT'S/FAMILY'S RESPONSE TO PLAN  OF CARE: Sister was engaged in conversation.  Sister thanked CSW for intervention.

## 2013-04-16 NOTE — Progress Notes (Signed)
Inpatient Diabetes Program Recommendations  AACE/ADA: New Consensus Statement on Inpatient Glycemic Control (2013)  Target Ranges:  Prepandial:   less than 140 mg/dL      [facility] postprandial:   less than 180 mg/dL (1-2 hours)      Critically ill patients:  140 - 180 mg/dL  Inpatient Diabetes Program Recommendations Insulin - Meal Coverage: consider adding Novolog 3 units TID with meals for elevated postprandial CBGs (not given if eats <50%) Thank you  Piedad Climes BSN, RN,CDE Inpatient Diabetes Coordinator [phone] (team pager)

## 2013-04-16 NOTE — Progress Notes (Signed)
ANTIBIOTIC CONSULT NOTE - INITIAL  Pharmacy Consult for Vancocin and Zosyn Indication: cellulitis  Allergies  Allergen Reactions  . Asa (Aspirin)     Takes baby enteric-coated ASA at home.    Patient Measurements: Height: 5\' 4"  (162.6 cm) Weight: 121 lb 4.1 oz (55 kg) IBW/kg (Calculated) : 59.2  Vital Signs: Temp: 101 F (38.3 C) (06/09 1040) Temp src: Oral (06/09 1040) BP: 97/43 mmHg (06/09 0700) Pulse Rate: 117 (06/09 0700)   Microbiology: Recent Results (from the past 720 hour(s))  CULTURE, BLOOD (ROUTINE X 2)     Status: None   Collection Time    03/30/13  6:00 PM      Result Value Range Status   Specimen Description BLOOD RIGHT WRIST   Final   Special Requests BOTTLES DRAWN AEROBIC ONLY 5CC   Final   Culture  Setup Time 03/31/2013 03:03   Final   Culture NO GROWTH 5 DAYS   Final   Report Status 04/06/2013 FINAL   Final  CULTURE, BLOOD (ROUTINE X 2)     Status: None   Collection Time    03/30/13  7:08 PM      Result Value Range Status   Specimen Description BLOOD HAND RIGHT   Final   Special Requests BOTTLES DRAWN AEROBIC ONLY 3CC   Final   Culture  Setup Time 03/31/2013 03:03   Final   Culture NO GROWTH 5 DAYS   Final   Report Status 04/06/2013 FINAL   Final  URINE CULTURE     Status: None   Collection Time    03/30/13  8:59 PM      Result Value Range Status   Specimen Description URINE, CLEAN CATCH   Final   Special Requests NONE   Final   Culture  Setup Time 03/31/2013 01:50   Final   Colony Count 3,000 COLONIES/ML   Final   Culture INSIGNIFICANT GROWTH   Final   Report Status 04/01/2013 FINAL   Final  GRAM STAIN     Status: None   Collection Time    03/30/13  8:59 PM      Result Value Range Status   Specimen Description URINE, CLEAN CATCH   Final   Special Requests NONE   Final   Gram Stain     Final   Value: CYTOSPIN     Neg for bacteria     WBC PRESENT,BOTH PMN AND MONONUCLEAR Only 2 cells seen on slide     Results Called toRayburn Ma, RN  [name]   Report Status 03/30/2013 FINAL   Final  MRSA PCR SCREENING     Status: None   Collection Time    03/31/13 12:17 AM      Result Value Range Status   MRSA by PCR NEGATIVE  NEGATIVE Final   Comment:            The GeneXpert MRSA Assay (FDA     approved for NASAL specimens     only), is one component of a     comprehensive MRSA colonization     surveillance program. It is not     intended to diagnose MRSA     infection nor to guide or     monitor treatment for     MRSA infections.  CULTURE, ROUTINE-ABSCESS     Status: None   Collection Time    04/07/13 11:14 AM      Result Value Range Status   Specimen Description ABSCESS  LEFT GROIN   Final   Special Requests PATIENT ON FOLLOWING AUGMENTIN   Final   Gram Stain     Final   Value: NO WBC SEEN     NO SQUAMOUS EPITHELIAL CELLS SEEN     NO ORGANISMS SEEN   Culture NO GROWTH 3 DAYS   Final   Report Status 04/10/2013 FINAL   Final  ANAEROBIC CULTURE     Status: None   Collection Time    04/07/13 11:14 AM      Result Value Range Status   Specimen Description ABSCESS LEFT GROIN   Final   Special Requests PATIENT ON FOLLOWING AUGMENTIN   Final   Gram Stain     Final   Value: NO WBC SEEN     NO SQUAMOUS EPITHELIAL CELLS SEEN     NO ORGANISMS SEEN   Culture MULTIPLE ORGANISMS PRESENT, NONE PREDOMINANT   Final   Report Status 04/13/2013 FINAL   Final  MRSA PCR SCREENING     Status: None   Collection Time    04/13/13  3:29 AM      Result Value Range Status   MRSA by PCR NEGATIVE  NEGATIVE Final   Comment:            The GeneXpert MRSA Assay (FDA     approved for NASAL specimens     only), is one component of a     comprehensive MRSA colonization     surveillance program. It is not     intended to diagnose MRSA     infection nor to guide or     monitor treatment for     MRSA infections.  CULTURE, BLOOD (ROUTINE X 2)     Status: None   Collection Time    04/13/13  4:15 AM      Result Value Range Status    Specimen Description BLOOD RIGHT HAND   Final   Special Requests BOTTLES DRAWN AEROBIC ONLY 5CC   Final   Culture  Setup Time 04/13/2013 09:36   Final   Culture     Final   Value:        BLOOD CULTURE RECEIVED NO GROWTH TO DATE CULTURE WILL BE HELD FOR 5 DAYS BEFORE ISSUING A FINAL NEGATIVE REPORT   Report Status PENDING   Incomplete  CULTURE, BLOOD (ROUTINE X 2)     Status: None   Collection Time    04/13/13  4:40 AM      Result Value Range Status   Specimen Description BLOOD RIGHT HAND   Final   Special Requests BOTTLES DRAWN AEROBIC ONLY 3CC   Final   Culture  Setup Time 04/13/2013 09:36   Final   Culture     Final   Value:        BLOOD CULTURE RECEIVED NO GROWTH TO DATE CULTURE WILL BE HELD FOR 5 DAYS BEFORE ISSUING A FINAL NEGATIVE REPORT   Report Status PENDING   Incomplete  ANAEROBIC CULTURE     Status: None   Collection Time    04/15/13  8:00 AM      Result Value Range Status   Specimen Description ABSCESS LEFT GROIN   Final   Special Requests PATIENT ON FOLLOWING ZOSYN VANCOMYCIN   Final   Gram Stain     Final   Value: MODERATE WBC PRESENT, PREDOMINANTLY PMN     NO SQUAMOUS EPITHELIAL CELLS SEEN     NO ORGANISMS SEEN   Culture  Final   Value: NO ANAEROBES ISOLATED; CULTURE IN PROGRESS FOR 5 DAYS   Report Status PENDING   Incomplete  CULTURE, ROUTINE-ABSCESS     Status: None   Collection Time    04/15/13  8:00 AM      Result Value Range Status   Specimen Description ABSCESS LEFT GROIN   Final   Special Requests PATIENT ON FOLLOWING ZOSYN VANCOMYCIN   Final   Gram Stain     Final   Value: MODERATE WBC PRESENT, PREDOMINANTLY PMN     NO SQUAMOUS EPITHELIAL CELLS SEEN     NO ORGANISMS SEEN   Culture Culture reincubated for better growth   Final   Report Status PENDING   Incomplete    Medical History: Past Medical History  Diagnosis Date  . Diabetes mellitus with polyneuropathy   . Blood transfusion without reported diagnosis   . Anemia   . Adenocarcinoma, colon      Colon  . Gout   . CAD (coronary artery disease)   . MI, old   . CVA (cerebral infarction)   . Hemiplegia, post-stroke   . Protein calorie malnutrition   . Stable angina   . Suppurative hidradenitis     ch  . Diabetes mellitus without complication   . Mental disorder     schizophrenia    Medications:  Prescriptions prior to admission  Medication Sig Dispense Refill  . acetaminophen (TYLENOL) 650 MG CR tablet Take 650 mg by mouth every 8 (eight) hours as needed for pain.      Marland Kitchen allopurinol (ZYLOPRIM) 300 MG tablet Take 300 mg by mouth daily.      Marland Kitchen amoxicillin-clavulanate (AUGMENTIN) 500-125 MG per tablet Take 1 tablet (500 mg total) by mouth 2 (two) times daily with a meal.  6 tablet  0  . aspirin EC 81 MG tablet Take 81 mg by mouth daily.      Marland Kitchen atorvastatin (LIPITOR) 80 MG tablet Take 80 mg by mouth daily.      . benztropine (COGENTIN) 1 MG tablet Take 1 mg by mouth daily.      . camphor-menthol (SARNA) lotion Apply 1 application topically daily as needed for itching.      . clindamycin (CLEOCIN T) 1 % external solution Apply 1 application topically 2 (two) times daily as needed (for hidradenitis).      . clopidogrel (PLAVIX) 75 MG tablet Take 75 mg by mouth daily.      Marland Kitchen doxycycline (VIBRA-TABS) 100 MG tablet Take 1 tablet (100 mg total) by mouth every 12 (twelve) hours.  6 tablet  0  . Ensure Plus (ENSURE PLUS) LIQD Take 237 mLs by mouth 2 (two) times daily between meals.      . ferrous sulfate 325 (65 FE) MG tablet Take 325 mg by mouth 2 (two) times daily.      Marland Kitchen gabapentin (NEURONTIN) 300 MG capsule Take 600 mg by mouth at bedtime.      . magnesium oxide (MAG-OX) 400 MG tablet Take 400 mg by mouth 3 (three) times daily.      . mirtazapine (REMERON) 15 MG tablet Take 15 mg by mouth at bedtime.      . Multiple Vitamin (MULTIVITAMIN WITH MINERALS) TABS Take 1 tablet by mouth daily.      . polyethylene glycol (MIRALAX / GLYCOLAX) packet Take 17 g by mouth daily.      .  QUEtiapine (SEROQUEL) 200 MG tablet Take 200 mg by mouth at bedtime.      Marland Kitchen  risperiDONE (RISPERDAL) 3 MG tablet Take 3 mg by mouth 2 (two) times daily.      Marland Kitchen triamcinolone cream (KENALOG) 0.1 % Apply 1 application topically 2 (two) times daily as needed (for dermatitis).       Scheduled:  . allopurinol  300 mg Oral Daily  . atorvastatin  80 mg Oral Daily  . clopidogrel  75 mg Oral Q breakfast  . feeding supplement  237 mL Oral BID BM  . ferrous sulfate  325 mg Oral BID  . gabapentin  600 mg Oral QHS  . insulin aspart  0-15 Units Subcutaneous TID WC  . magnesium oxide  400 mg Oral TID  . mirtazapine  15 mg Oral QHS  . multivitamin with minerals  1 tablet Oral Daily  . piperacillin-tazobactam (ZOSYN)  IV  3.375 g Intravenous Q8H  . polyethylene glycol  17 g Oral Daily  . QUEtiapine  200 mg Oral QHS  . risperiDONE  3 mg Oral BID  . sodium chloride  3 mL Intravenous Q12H  . vancomycin  1,000 mg Intravenous Q24H   Infusions:  . sodium chloride 1,000 mL (04/16/13 0816)    Assessment: 57yo male w/ recent multiple I&Ds now comes from OSH for signs of SIRS on IV ABX for cellulitis. Of note he has hemiplegia with stage 3 decubitis ulcers. His muscle mass is very low so calculated CrCl is expected to be an overestimate of renal function. Vancomycin trough subtherapeutic = 7.6, drawn 1 hr early,  Scr also trending up 0.62 > 0.79, will increase vancomycin dose carefully and monitor renal function closely.  Goal of Therapy:  Vancomycin trough level 10-15 mcg/ml  Plan:  - Vancomycin 1250mg  IV Q24H - Zosyn 3.375g IV Q8H - Monitor renal function, cultures, and levels prn.  Bayard Hugger, PharmD, BCPS  Clinical Pharmacist  Pager: [phone]   04/16/2013 11:38 AM

## 2013-04-16 NOTE — Progress Notes (Signed)
Hypotension   250 cc NS bolus given

## 2013-04-16 NOTE — Progress Notes (Signed)
CRITICAL VALUE ALERT  Critical value received:  Hgb 4.8  Date of notification:  04/16/13  Time of notification:  0908  Critical value read back:yes  Nurse who received alert:  Lora Havens  MD notified (1st page):  MD Jomarie Longs  Time of first page:  0908  MD notified (2nd page):  Time of second page:  Responding MD:  Jomarie Longs  Time MD responded:  [phone]

## 2013-04-16 NOTE — Progress Notes (Signed)
1 Day Post-Op  Subjective: Called by floor.  Nurse reports drop in H/H.  HEMOGLOBIN REPORTED down to 4.8. Recheck shows H/H 7.7/21.  Hydro therapy found allot of old clotted blood on the buttocks dressing. We were called to see and evaluate again..  Objective: Vital signs in last 24 hours: Temp:  [97.2 F (36.2 C)-101.2 F (38.4 C)] 99.2 F (37.3 C) (06/09 0700) Pulse Rate:  [105-130] 117 (06/09 0700) Resp:  [18-28] 26 (06/09 0700) BP: (90-128)/(42-60) 97/43 mmHg (06/09 0700) SpO2:  [97 %-100 %] 97 % (06/09 0700) Weight:  [55 kg (121 lb 4.1 oz)] 55 kg (121 lb 4.1 oz) (06/09 0010) Last BM Date: 04/13/13  Intake/Output from previous day: 06/08 0701 - 06/09 0700 In: 2401 [P.O.:1251; I.V.:900; IV Piggyback:250] Out: 2050 [Urine:2000; Blood:50] Intake/Output this shift: Total I/O In: 128 [I.V.:3; IV Piggyback:125] Out: 100 [Urine:100]  General appearance: alert, slowed mentation and nurse says he's more alert than earilier today.  He feels febrile.  Rectal temp 101. Resp: clear to auscultation bilaterally Skin: Skin color, texture, turgor normal. No rashes or lesions or I pulled the dressings down from all areas, scrotum, and buttocks.  The scrotum was fairly dry, not much blood.  the buttocks dressing was soaked with old clotted blood.  I inspected both the right and left buttocks and groin.  I found one place right buttocks, 2-3 cm area that was opened and about 1-2 cm deep at the deepest point that had some ongoing oozing of old blood.  I packed this after cleaning the  areas throughty with wet 4x4's.  I then looked at the left side and repacked the old area.  We then looked at the right again.  The draining site of old blood was better..  We did wet to dry dressing over all the open sites and covered with abd's and gauze panties.  He still has some drainage from the right groin above old scar.  We will watch that for now.  Lab Results:   Recent Labs  04/15/13 1604 04/16/13 0950   WBC 16.7* 23.4*  HGB 8.0* 6.7*  HCT 25.3* 21.0*  PLT 392 387    BMET  Recent Labs  04/14/13 0550 04/16/13 0808  NA 136 134*  K 3.6 4.6  CL 98 98  CO2 30 27  GLUCOSE 126* 145*  BUN 7 13  CREATININE 0.62 0.79  CALCIUM 8.9 8.0*   PT/INR No results found for this basename: LABPROT, INR,  in the last 72 hours   Recent Labs Lab 04/13/13 0420  AST 44*  ALT 29  ALKPHOS 229*  BILITOT 0.4  PROT 10.1*  ALBUMIN 1.7*     Lipase  No results found for this basename: lipase     Studies/Results: No results found.  Medications: . allopurinol  300 mg Oral Daily  . atorvastatin  80 mg Oral Daily  . clopidogrel  75 mg Oral Q breakfast  . feeding supplement  237 mL Oral BID BM  . ferrous sulfate  325 mg Oral BID  . gabapentin  600 mg Oral QHS  . insulin aspart  0-15 Units Subcutaneous TID WC  . magnesium oxide  400 mg Oral TID  . mirtazapine  15 mg Oral QHS  . multivitamin with minerals  1 tablet Oral Daily  . piperacillin-tazobactam (ZOSYN)  IV  3.375 g Intravenous Q8H  . polyethylene glycol  17 g Oral Daily  . QUEtiapine  200 mg Oral QHS  . risperiDONE  3  mg Oral BID  . sodium chloride  3 mL Intravenous Q12H  . vancomycin  1,000 mg Intravenous Q24H    Assessment/Plan Recurrent hidradenitis, Removal of condom catheter and placement of 16 French Foley catheter (5 cc balloon)  Scrotal expiration with excision of multiple scrotal necrotic areas consistent with hidradenitis. Further I/d of bilateral perineal abscess. Dr. Donell Beers and Tannenbaum6/8/14 IRRIGATION AND NON-EXCISIONAL DEBRIDEMENT ABSCESS left groin abscess, 04/07/2013, Cherylynn Ridges, MD. Fever up to 101 now. Schizophrenia - Resides with his family History of CVA with left-sided hemiplegia - on antiplatelet agents. History of colon cancer status post hemicolectomy AODM History of gout  Chronic anemia  Code status: DNR (Palliative consult pending.)   PLan:  I just did a dressing change, they are giving him  tylenol for the fever.  He will be transfused by Dr. Jomarie Longs,  We will get and air mattress overlay for him and do dressing changes wet to dry BID and PRN.        LOS: 3 days    Devanshi Califf 04/16/2013

## 2013-04-16 NOTE — Progress Notes (Signed)
Hydortherapy Cancellation Note  Patient Details Name: Xavier Khan MRN: [id number] DOB: Aug 27, 1956   Cancelled Treatment:    Reason Eval/Treat Not Completed: Medical issues which prohibited therapy (significant amount of blood on dressings.) Deferred hydro eval and pulsatile lavage today.  Will plan on initiating hydrotherapy tomorrow.   Cassie Shedlock 04/16/2013, 11:13 AM
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
## 2013-04-16 NOTE — Progress Notes (Signed)
1 Day Post-Op  Subjective: comfortable  Objective: Vital signs in last 24 hours: Temp:  [96.6 F (35.9 C)-101.2 F (38.4 C)] 99.2 F (37.3 C) (06/09 0700) Pulse Rate:  [52-130] 117 (06/09 0700) Resp:  [13-28] 26 (06/09 0700) BP: (90-128)/(42-74) 97/43 mmHg (06/09 0700) SpO2:  [84 %-100 %] 97 % (06/09 0700) Weight:  [121 lb 4.1 oz (55 kg)] 121 lb 4.1 oz (55 kg) (06/09 0010) Last BM Date: 04/13/13  Intake/Output from previous day: 06/08 0701 - 06/09 0700 In: 2401 [P.O.:1251; I.V.:900; IV Piggyback:250] Out: 2050 [Urine:2000; Blood:50] Intake/Output this shift: Total I/O In: -  Out: 100 [Urine:100]  Dressings to groins, scrotum intact  Lab Results:   Recent Labs  04/14/13 0550 04/15/13 1604  WBC 16.5* 16.7*  HGB 8.7* 8.0*  HCT 27.0* 25.3*  PLT 461* 392   BMET  Recent Labs  04/13/13 1615 04/14/13 0550  NA 133* 136  K 3.7 3.6  CL 96 98  CO2 30 30  GLUCOSE 240* 126*  BUN 9 7  CREATININE 0.46* 0.62  CALCIUM 8.4 8.9   PT/INR No results found for this basename: LABPROT, INR,  in the last 72 hours ABG No results found for this basename: PHART, PCO2, PO2, HCO3,  in the last 72 hours  Studies/Results: No results found.  Anti-infectives: Anti-infectives   Start     Dose/Rate Route Frequency Ordered Stop   04/14/13 1200  vancomycin (VANCOCIN) IVPB 1000 mg/200 mL premix     1,000 mg 200 mL/hr over 60 Minutes Intravenous Every 24 hours 04/14/13 0901     04/13/13 1200  vancomycin (VANCOCIN) 1,500 mg in sodium chloride 0.9 % 500 mL IVPB  Status:  Discontinued     1,500 mg 250 mL/hr over 120 Minutes Intravenous Every 24 hours 04/13/13 0454 04/14/13 0901   04/13/13 0600  piperacillin-tazobactam (ZOSYN) IVPB 3.375 g     3.375 g 12.5 mL/hr over 240 Minutes Intravenous Every 8 hours 04/13/13 0454     04/13/13 0500  piperacillin-tazobactam (ZOSYN) IVPB 3.375 g  Status:  Discontinued     3.375 g 100 mL/hr over 30 Minutes Intravenous  Once 04/13/13 0454 04/13/13 0504       Assessment/Plan: s/p Procedure(s) with comments: IRRIGATION AND DEBRIDEMENT ABSCESS (N/A) - scrotum and penis INCISION AND DRAINAGE ABSCESS DRESSING CHANGE UNDER ANESTHESIA   Continue local wound care and antibiotics  LOS: 3 days    Laban Orourke A 04/16/2013

## 2013-04-17 ENCOUNTER — Encounter (INDEPENDENT_AMBULATORY_CARE_PROVIDER_SITE_OTHER): Payer: Medicare (Managed Care)

## 2013-04-17 DIAGNOSIS — F0391 Unspecified dementia with behavioral disturbance: Secondary | ICD-10-CM

## 2013-04-17 DIAGNOSIS — L732 Hidradenitis suppurativa: Secondary | ICD-10-CM

## 2013-04-17 DIAGNOSIS — E119 Type 2 diabetes mellitus without complications: Secondary | ICD-10-CM

## 2013-04-17 DIAGNOSIS — A419 Sepsis, unspecified organism: Secondary | ICD-10-CM

## 2013-04-17 DIAGNOSIS — F03918 Unspecified dementia, unspecified severity, with other behavioral disturbance: Secondary | ICD-10-CM

## 2013-04-17 DIAGNOSIS — D649 Anemia, unspecified: Secondary | ICD-10-CM

## 2013-04-17 DIAGNOSIS — F209 Schizophrenia, unspecified: Secondary | ICD-10-CM

## 2013-04-17 LAB — CBC
MCH: 30.8 pg (ref 26.0–34.0)
MCV: 89.9 fL (ref 78.0–100.0)
Platelets: 325 10*3/uL (ref 150–400)
RDW: 16.5 % — ABNORMAL HIGH (ref 11.5–15.5)
WBC: 17.4 10*3/uL — ABNORMAL HIGH (ref 4.0–10.5)

## 2013-04-17 LAB — BASIC METABOLIC PANEL
Calcium: 8.3 mg/dL — ABNORMAL LOW (ref 8.4–10.5)
Creatinine, Ser: 0.66 mg/dL (ref 0.50–1.35)
GFR calc Af Amer: >90 mL/min (ref >90)
GFR calc non Af Amer: >90 mL/min (ref >90)
Sodium: 135 mEq/L (ref 135–145)

## 2013-04-17 LAB — GLUCOSE, CAPILLARY: Glucose-Capillary: 271 mg/dL — ABNORMAL HIGH (ref 70–99)

## 2013-04-17 MED ORDER — HYDROMORPHONE HCL PF 1 MG/ML IJ SOLN
0.5000 mg | Freq: Once | INTRAMUSCULAR | Status: AC
  Administered 2013-04-17: 0.5 mg via INTRAVENOUS

## 2013-04-17 MED ORDER — SODIUM CHLORIDE 0.9 % IV BOLUS (SEPSIS)
250.0000 mL | Freq: Once | INTRAVENOUS | Status: AC
  Administered 2013-04-17: 250 mL via INTRAVENOUS

## 2013-04-17 NOTE — Progress Notes (Signed)
Physical Therapy Wound Evaluation   Patient Details  Name: Xavier Khan MRN: [id number] Date of Birth: [birth year]  Today's Date: 04/17/2013 Time: 1308-6578 Time Calculation (min): 75 min  Subjective  Patient and Family Stated Goals: no goal stated--pt agreed we needed to get it clean Date of Onset:  (prior to admission) Prior Treatments: prior hydrotherapy and nursing managed wound care ( )  Pain Score: Pain Score: Asleep  Wound Assessment  Wound 04/01/13 Other (Comment) Multiple wound sites: Axilla, groin, Buttocks, thigh (Active)  Site / Wound Assessment Bleeding;Pink;Red 04/17/2013  2:00 PM  % Wound base Red or Granulating 95% 04/17/2013  2:00 PM  % Wound base Yellow 5% 04/17/2013  2:00 PM  % Wound base Black 5% 04/10/2013 11:31 AM  Peri-wound Assessment Erythema (blanchable) 04/17/2013  2:00 PM  Wound Length (cm) 7.3 cm 04/09/2013 11:29 AM  Wound Width (cm) 5 cm 04/09/2013 11:29 AM  Wound Depth (cm) 1.2 cm 04/09/2013 11:29 AM  Undermining (cm) 1 oclock 4.5 cm, 4-6 oclock 2.0 cm 04/09/2013 11:29 AM  Margins Unattacted edges (unapproximated) 04/17/2013  2:00 PM  Closure None 04/17/2013  2:00 PM  Drainage Amount Moderate 04/17/2013  2:00 PM  Drainage Description Purulent;Sanguineous 04/17/2013  2:00 PM  Non-staged Wound Description Not applicable 04/17/2013  2:00 PM  Treatment Hydrotherapy (Pulse lavage);Debridement (Selective);Cleansed;Packing (Plain strip);Packing (Saline gauze) 04/17/2013  2:00 PM  Dressing Type Abdominal binder;Gauze (Comment) 04/17/2013  2:00 PM  Dressing Changed Changed 04/17/2013  2:00 PM  Dressing Status Clean;Dry;Intact 04/17/2013  2:00 PM     Incision 04/07/13 Groin Left (Active)  Site / Wound Assessment Clean 04/16/2013  9:00 PM  Incision Length (cm) 2 cm 04/13/2013  3:55 AM  Margins Unattacted edges (unapproximated) 04/16/2013  9:00 PM  Closure None 04/16/2013  9:00 PM  Drainage Amount Minimal 04/16/2013  9:00 PM  Drainage Description Sanguineous 04/16/2013  9:00 PM  Treatment  Cleansed 04/13/2013  3:55 AM  Dressing Type Gauze (Comment) 04/16/2013  9:00 PM  Dressing Changed;Clean;Dry;Intact 04/16/2013  8:00 AM     Incision 04/07/13 Buttocks Left (Active)  Site / Wound Assessment Bleeding 04/16/2013  9:00 PM  Incision Length (cm) 10 cm 04/13/2013  3:55 AM  Margins Unattacted edges (unapproximated) 04/16/2013  9:00 PM  Closure None 04/16/2013  9:00 PM  Drainage Amount Copious 04/16/2013  9:00 PM  Drainage Description Sanguineous 04/16/2013  9:00 PM  Treatment Other (Comment) 04/16/2013  8:00 AM  Dressing Type ABD;Gauze (Comment) 04/16/2013  9:00 PM  Dressing Changed;Clean;Dry;Intact 04/16/2013  8:00 AM     Incision 04/15/13 Perineum (Active)  Site / Wound Assessment Bleeding 04/17/2013  2:00 PM  Margins Unattacted edges (unapproximated) 04/17/2013  2:00 PM  Closure None 04/17/2013  2:00 PM  Drainage Amount Moderate 04/17/2013  2:00 PM  Drainage Description Sanguineous;Purulent 04/17/2013  2:00 PM  Dressing Type ABD;Gauze (Comment) 04/17/2013  2:00 PM  Dressing Changed 04/17/2013  2:00 PM     Incision 04/15/13 Scrotum (Active)  Site / Wound Assessment Bleeding 04/17/2013  2:00 PM  Margins Unattacted edges (unapproximated) 04/17/2013  2:00 PM  Closure None 04/17/2013  2:00 PM  Drainage Amount Copious 04/17/2013  2:00 PM  Drainage Description Sanguineous 04/17/2013  2:00 PM  Treatment Cleansed 04/17/2013  2:00 PM  Dressing Type Gauze (Comment);ABD 04/17/2013  2:00 PM  Dressing Changed 04/17/2013  2:00 PM     Incision 04/15/13 Perineum (Active)  Site / Wound Assessment Bleeding 04/17/2013  2:00 PM  Margins Unattacted edges (unapproximated) 04/17/2013  2:00 PM  Closure None 04/17/2013  2:00 PM  Drainage Amount Moderate 04/17/2013  2:00 PM  Drainage Description Serosanguineous 04/17/2013  2:00 PM  Dressing Type ABD;Gauze (Comment) 04/17/2013  2:00 PM  Dressing Changed 04/17/2013  2:00 PM   Hydrotherapy Pulsed lavage therapy - wound location: l hip, L and R perineum, scrotum Pulsed Lavage with  Suction (psi): 4 psi Pulsed Lavage with Suction - Normal Saline Used: 2000 mL Pulsed Lavage Tip: Tip with splash shield Selective Debridement Selective Debridement - Tools Used: Forceps;Scissors Selective Debridement - Tissue Removed: yellow eschar   Wound Assessment and Plan  Wound Therapy - Assess/Plan/Recommendations Wound Therapy - Clinical Statement: PLS to cleanse all wounds, selective debridement of left over yellow necrosis and preparation for healing. Wound Therapy - Functional Problem List: immobility Factors Delaying/Impairing Wound Healing: Immobility;Multiple medical problems Hydrotherapy Plan: Debridement;Patient/family education;Pulsatile lavage with suction Wound Therapy - Frequency: 6X / week Wound Therapy - Follow Up Recommendations: Skilled nursing facility Wound Plan: promote healthy wound with PLS to cleanse and selective debridement to prep. for healing,.  Wound Therapy Goals- Improve the function of patient's integumentary system by progressing the wound(s) through the phases of wound healing (inflammation - proliferation - remodeling) by: Decrease Necrotic Tissue to: 0% Decrease Necrotic Tissue - Progress: Goal set today Increase Granulation Tissue to: 100% Increase Granulation Tissue - Progress: Goal set today Improve Drainage Characteristics: Min;Serous Improve Drainage Characteristics - Progress: Goal set today Time For Goal Achievement: 2 weeks Wound Therapy - Potential for Goals: Good  Goals will be updated until maximal potential achieved or discharge criteria met.  Discharge criteria: when goals achieved, discharge from hospital, MD decision/surgical intervention, no progress towards goals, refusal/missing three consecutive treatments without notification or medical reason.  GP     Juhi Lagrange, Eliseo Gum 04/17/2013, 2:40 PM 04/17/2013  Henderson Bing, PT [phone]  (pager)

## 2013-04-17 NOTE — Progress Notes (Signed)
Inpatient Diabetes Program Recommendations  AACE/ADA: New Consensus Statement on Inpatient Glycemic Control (2013)  Target Ranges:  Prepandial:   less than 140 mg/dL      [facility] postprandial:   less than 180 mg/dL (1-2 hours)      Critically ill patients:  140 - 180 mg/dL   Inpatient Diabetes Program Recommendations Correction (SSI): decrease to SENSITIVE scale  Insulin - Meal Coverage: . Thank you  Piedad Climes BSN, RN,CDE Inpatient Diabetes Coordinator [phone] (team pager)

## 2013-04-17 NOTE — Progress Notes (Signed)
Obtained consent for PICC insertion from patient's sister, but after explaining this procedure to patient, he is refusing PICC insertion.  Staff RN notified

## 2013-04-17 NOTE — Progress Notes (Signed)
Palliative Medicine Team Progress Note   S: Resting comfortably. Eyes closed, he is not agitated. Still not eating well. No family at bedside. No complaints. Affect flat.  Reviewed labs, vitals, prior notes and plans.  Assessment:  Xavier Khan has hidradenitis suppurativa, schizophrenia mostly well managed, CAD and a history of colon cancer. He was admitted with sepsis related to perineal and scrotal  abscess with extensive soft tissue involvement. Now requiring antibiotics and aggressive wound care. Also complicating his hospital course was acute post-op blood loss anemia requiring transfusion. Goals of care meeting yesterday with his family. Focus on treatment and planning as well as ways we can improve his QOL and set realistic goals for his possible recovery.  Plan: Noted plan for LTACH/[facility]. Patient prone to refuse interventions and treatment mostly because of paranoia but he appears to be able to express appropriate insight and understanding into why he needs certain things done-and is cooperative. Psych notes in past document patient does not have capacity-his guardian is his sister [name] who ultimately makes his decisions.  Agree with plan for LTACH for ongoing wound care as appropriate to give him the best possible chance for recovery-family are aware of risks for complications and that he may not be able to fully heal these wounds.   Will continue to follow him for symptom management needs and ongoing goals for this hospitalization.   Time: 15 minutes. Greater than 50%  of this time was spent counseling and coordinating care related to the above assessment and plan.   Anderson Malta, DO Palliative Medicine

## 2013-04-17 NOTE — Care Management Note (Addendum)
    Page 1 of 1   04/17/2013     3:47:54 PM   CARE MANAGEMENT NOTE 04/17/2013  Patient:  OSBORNE, SERIO   Account Number:  [id number]  Date Initiated:  04/13/2013  Documentation initiated by:  Junius Creamer  Subjective/Objective Assessment:   adm w cellulitis     Action/Plan:   from nsg facility   Anticipated DC Date:     Anticipated DC Plan:    In-house referral  Clinical Social Worker      DC Planning Services  CM consult      Choice offered to / List presented to:             Status of service:   Medicare Important Message given?   (If response is "NO", the following Medicare IM given date fields will be blank) Date Medicare IM given:   Date Additional Medicare IM given:    Discharge Disposition:    Per UR Regulation:  Reviewed for med. necessity/level of care/duration of stay  If discussed at Long Length of Stay Meetings, dates discussed:    Comments:  6/10 1001 debbie Nikeisha Klutz rn,bsn per [place] fam would like to know if pt's ins has ltac benefits. have asked [facility] to ck to see if benefits and elidg.per mike w [facility] pt has ltac benefits and [facility] has contract and would like to accept pt. spoke w sister hazel spruill and she would like [facility] if possible. await md guidance when stable for disch. mike w [facility] will reeval on 6-11.

## 2013-04-17 NOTE — Progress Notes (Addendum)
[facility] PROGRESS NOTE  Elchonon Maxson WUJ:[id number] DOB: [birth year] DOA: 04/13/2013 PCP: No PCP Per Patient  Brief Narrative Xavier Khan is a 57/M with schizophrenia, mild dementia, DM, CVA, CAD, Colon cancer s/p hemicolectomy, chronic hydradenitis was discharged 2 prior to admission from our facility after being treated for acute on chronic worsening of hidradenitis suppurativa of the groin area and had I&D done by surgery on 5/31 was placed on antibiotics and discharged to nursing home was found to have increasing discharge with leukocytosis and initially mildly hypotensive with tachycardia.  On evaluation he had multiple groin wounds now extending to the scrotum, with small openings and discharge. He was seen by CCS and URology in consultation and after multiple discussions with his sister [facility], he was taken to the OR and underwent Scrotal expiration with excision of multiple scrotal necrotic areas consistent with hidradenitis by Dr.tannenbaum and debridement by dr.Byerly of the perineal small multifocal abscesses. Post op 6/9 was febrile and lethargic with bleeding from his wounds. Continues to be on broad spectrum abx and received 2 units PRBC now. S/p Palliative meeting 6/9, want full scope of treatment  Assessment/Plan:  1. Acute on chronic Hidradenitis suppuritiva of the groin area with multifocal abscesses with scrotal involvement.      -recent debridement of groin 5/31      -continue vancomycin and Zosyn Day 4      -CCS/URology following      -s/p scrotal debridement 6/8 and Perineal debridement as well by CCS at the same time.      -Dr.Tannenbaum had discussions with pts sister/ PoA pre op       -On 6/9 with s/s of sepsis again, FU blood Cx x2, IVF, lactic acid normal     -FU wound cultures from OR, gram stain no organisms seen      -Given poor prognosis and recurrent abscesses with hydradenitis and high likelihood of requiring multiple surgeries in future, d/w sister  and agreeable for a Palliative consult for Goals of care, s/p meeting yesterday and want FULL SCOPE of treatment possible  2. Sepsis: due to 1 -abx, management as noted above  3. History of CAD status post stenting      -stable . 4. Schizophrenia -      - continue Seroquel/Risperdone      - cogentin stopped      -Sister is PoA, pt does not have capacity to make decisions  5. History of CVA with left-sided hemiplegia - on antiplatelet agents.      -DC Asa/continue plavix  6. Chronic anemia        -due to blood loss from 1, chronic disease        -transfused 2 units PRBC 6/9       -CBC Q12  7. History of gout - continue allopurinol.  8. History of colon cancer status post hemicolectomy  9.  DM: SSI for now  10. Protein calorie malnutrition: -Nutrition consult -Ensure BID  DVt proph: SCDs  Code status: DNR Called and updated sister [name] today Disposition: keep in SDU   Consultants:  CCS  Urology  Palliative medicine  Antibiotics:  VAnc 6/6  Zosyn 6/6  HPI/Subjective: More lucid today, continues to have oozing from wounds . Po intake poor  Objective: Filed Vitals:   04/17/13 0800 04/17/13 1000 04/17/13 1155 04/17/13 1204  BP: 99/48 109/53 99/49   Pulse: 101  104   Temp:    98.7 F (37.1 C)  TempSrc:  Oral  Resp: 22 29 17    Height:      Weight:      SpO2:   98%     Intake/Output Summary (Last 24 hours) at 04/17/13 1250 Last data filed at 04/17/13 1100  Gross per 24 hour  Intake 2265.5 ml  Output   2800 ml  Net -534.5 ml   Filed Weights   04/13/13 0300 04/15/13 0408 04/16/13 0010  Weight: 55.1 kg (121 lb 7.6 oz) 55 kg (121 lb 4.1 oz) 55 kg (121 lb 4.1 oz)    Exam:   General:  Awake, alert and talking, improvement from yesterday, oriented to self and placeo self only  Cardiovascular: S1S2/RRR  Respiratory: CTAB  Abdomen: soft, NT, BS present  Musculoskeletal: no edema c/c  Perineum:  Medial thigh wounds,  Base of penis and  Scrotal thickening with bleeding/purulent drainage covered with WTD dressing  Axilla: with hydradenitis with minimal purulence  Data Reviewed: Basic Metabolic Panel:  Recent Labs Lab 04/13/13 0420 04/13/13 1615 04/14/13 0550 04/16/13 0808 04/17/13 0505  NA 126* 133* 136 134* 135  K 6.0* 3.7 3.6 4.6 3.7  CL 92* 96 98 98 103  CO2 23 30 30 27 27   GLUCOSE 213* 240* 126* 145* 62*  BUN 9 9 7 13 9   CREATININE 0.40* 0.46* 0.62 0.79 0.66  CALCIUM 8.8 8.4 8.9 8.0* 8.3*   Liver Function Tests:  Recent Labs Lab 04/13/13 0420  AST 44*  ALT 29  ALKPHOS 229*  BILITOT 0.4  PROT 10.1*  ALBUMIN 1.7*   No results found for this basename: LIPASE, AMYLASE,  in the last 168 hours No results found for this basename: AMMONIA,  in the last 168 hours CBC:  Recent Labs Lab 04/13/13 0708 04/14/13 0550 04/15/13 1604 04/16/13 0950 04/16/13 2039 04/17/13 0505  WBC 16.5* 16.5* 16.7* 23.4*  --  17.4*  NEUTROABS 14.3*  --   --   --   --   --   HGB 8.6* 8.7* 8.0* 6.7* 8.6* 8.5*  HCT 26.2* 27.0* 25.3* 21.0* 25.5* 24.8*  MCV 91.9 93.8 94.8 93.8  --  89.9  PLT 453* 461* 392 387  --  325   Cardiac Enzymes: No results found for this basename: CKTOTAL, CKMB, CKMBINDEX, TROPONINI,  in the last 168 hours BNP (last 3 results) No results found for this basename: PROBNP,  in the last 8760 hours CBG:  Recent Labs Lab 04/16/13 1254 04/16/13 1629 04/16/13 2140 04/17/13 0749 04/17/13 1151  GLUCAP 328* 149* 71 84 111*    Recent Results (from the past 240 hour(s))  MRSA PCR SCREENING     Status: None   Collection Time    04/13/13  3:29 AM      Result Value Range Status   MRSA by PCR NEGATIVE  NEGATIVE Final   Comment:            The GeneXpert MRSA Assay (FDA     approved for NASAL specimens     only), is one component of a     comprehensive MRSA colonization     surveillance program. It is not     intended to diagnose MRSA     infection nor to guide or     monitor treatment for      MRSA infections.  CULTURE, BLOOD (ROUTINE X 2)     Status: None   Collection Time    04/13/13  4:15 AM      Result Value Range Status  Specimen Description BLOOD RIGHT HAND   Final   Special Requests BOTTLES DRAWN AEROBIC ONLY 5CC   Final   Culture  Setup Time 04/13/2013 09:36   Final   Culture     Final   Value:        BLOOD CULTURE RECEIVED NO GROWTH TO DATE CULTURE WILL BE HELD FOR 5 DAYS BEFORE ISSUING A FINAL NEGATIVE REPORT   Report Status PENDING   Incomplete  CULTURE, BLOOD (ROUTINE X 2)     Status: None   Collection Time    04/13/13  4:40 AM      Result Value Range Status   Specimen Description BLOOD RIGHT HAND   Final   Special Requests BOTTLES DRAWN AEROBIC ONLY 3CC   Final   Culture  Setup Time 04/13/2013 09:36   Final   Culture     Final   Value:        BLOOD CULTURE RECEIVED NO GROWTH TO DATE CULTURE WILL BE HELD FOR 5 DAYS BEFORE ISSUING A FINAL NEGATIVE REPORT   Report Status PENDING   Incomplete  ANAEROBIC CULTURE     Status: None   Collection Time    04/15/13  8:00 AM      Result Value Range Status   Specimen Description ABSCESS LEFT GROIN   Final   Special Requests PATIENT ON FOLLOWING ZOSYN VANCOMYCIN   Final   Gram Stain     Final   Value: MODERATE WBC PRESENT, PREDOMINANTLY PMN     NO SQUAMOUS EPITHELIAL CELLS SEEN     NO ORGANISMS SEEN   Culture     Final   Value: NO ANAEROBES ISOLATED; CULTURE IN PROGRESS FOR 5 DAYS   Report Status PENDING   Incomplete  CULTURE, ROUTINE-ABSCESS     Status: None   Collection Time    04/15/13  8:00 AM      Result Value Range Status   Specimen Description ABSCESS LEFT GROIN   Final   Special Requests PATIENT ON FOLLOWING ZOSYN VANCOMYCIN   Final   Gram Stain     Final   Value: MODERATE WBC PRESENT, PREDOMINANTLY PMN     NO SQUAMOUS EPITHELIAL CELLS SEEN     NO ORGANISMS SEEN   Culture FEW PROTEUS SPECIES   Final   Report Status PENDING   Incomplete  CULTURE, BLOOD (ROUTINE X 2)     Status: None   Collection Time     04/16/13  8:00 AM      Result Value Range Status   Specimen Description BLOOD RIGHT FOREARM   Final   Special Requests BOTTLES DRAWN AEROBIC AND ANAEROBIC 7CC   Final   Culture  Setup Time 04/16/2013 14:09   Final   Culture     Final   Value:        BLOOD CULTURE RECEIVED NO GROWTH TO DATE CULTURE WILL BE HELD FOR 5 DAYS BEFORE ISSUING A FINAL NEGATIVE REPORT   Report Status PENDING   Incomplete  CULTURE, BLOOD (ROUTINE X 2)     Status: None   Collection Time    04/16/13  8:08 AM      Result Value Range Status   Specimen Description BLOOD RIGHT HAND   Final   Special Requests BOTTLES DRAWN AEROBIC ONLY [facility]   Final   Culture  Setup Time 04/16/2013 14:13   Final   Culture     Final   Value:        BLOOD CULTURE RECEIVED  NO GROWTH TO DATE CULTURE WILL BE HELD FOR 5 DAYS BEFORE ISSUING A FINAL NEGATIVE REPORT   Report Status PENDING   Incomplete     Studies: No results found.  Scheduled Meds: . allopurinol  300 mg Oral Daily  . antiseptic oral rinse  15 mL Mouth Rinse BID  . atorvastatin  80 mg Oral Daily  . clopidogrel  75 mg Oral Q breakfast  . feeding supplement  237 mL Oral BID BM  . ferrous sulfate  325 mg Oral BID  . gabapentin  600 mg Oral QHS  . insulin aspart  0-15 Units Subcutaneous TID WC  . magnesium oxide  400 mg Oral TID  . mirtazapine  15 mg Oral QHS  . multivitamin with minerals  1 tablet Oral Daily  . piperacillin-tazobactam (ZOSYN)  IV  3.375 g Intravenous Q8H  . polyethylene glycol  17 g Oral Daily  . QUEtiapine  200 mg Oral QHS  . risperiDONE  3 mg Oral BID  . sodium chloride  3 mL Intravenous Q12H  . vancomycin  1,250 mg Intravenous Q24H   Continuous Infusions: . sodium chloride 125 mL/hr (04/17/13 0122)    Principal Problem:   Cellulitis Active Problems:   CAD (coronary artery disease)   Hidradenitis suppurativa   Schizophrenia   History of CVA (cerebrovascular accident)   Anemia   Gout   Malnutrition of moderate degree   Sepsis    Hypotension, unspecified   Unspecified constipation   Dysphagia, pharyngoesophageal phase    Time spent:8min    [facility]  [facility] Pager [phone]. If 7PM-7AM, please contact night-coverage at [email or web address], password [facility] 04/17/2013, 12:50 PM  LOS: 4 days

## 2013-04-17 NOTE — Progress Notes (Signed)
2 Days Post-Op Subjective: Patient reports no acute pain. Alert but slow to verbalize answers.  Objective: Vital signs in last 24 hours: Temp:  [98.5 F (36.9 C)-101 F (38.3 C)] 99.7 F (37.6 C) (06/10 0350) Pulse Rate:  [88-109] 109 (06/10 0350) Resp:  [15-24] 24 (06/10 0350) BP: (74-109)/(40-58) 92/45 mmHg (06/10 0350) SpO2:  [96 %-97 %] 97 % (06/10 0350)  Intake/Output from previous day: 06/09 0701 - 06/10 0700 In: 3028 [I.V.:2378; Blood:525; IV Piggyback:125] Out: 2100 [Urine:2100] Intake/Output this shift:    Physical Exam:  General:alert and no distress GI: soft, non tender, normal bowel sounds, no palpable masses, no organomegaly, no inguinal hernia Male genitalia: foely w/in meatus draining clear amber urine. Scrotal wound with pink tissued and no odor. Dressing changed by nsursing   Lab Results:  Recent Labs  04/16/13 0950 04/16/13 2039 04/17/13 0505  HGB 6.7* 8.6* 8.5*  HCT 21.0* 25.5* 24.8*   BMET  Recent Labs  04/16/13 0808 04/17/13 0505  NA 134* 135  K 4.6 3.7  CL 98 103  CO2 27 27  GLUCOSE 145* 62*  BUN 13 9  CREATININE 0.79 0.66  CALCIUM 8.0* 8.3*   No results found for this basename: LABPT, INR,  in the last 72 hours No results found for this basename: LABURIN,  in the last 72 hours Results for orders placed during the hospital encounter of 04/13/13  MRSA PCR SCREENING     Status: None   Collection Time    04/13/13  3:29 AM      Result Value Range Status   MRSA by PCR NEGATIVE  NEGATIVE Final   Comment:            The GeneXpert MRSA Assay (FDA     approved for NASAL specimens     only), is one component of a     comprehensive MRSA colonization     surveillance program. It is not     intended to diagnose MRSA     infection nor to guide or     monitor treatment for     MRSA infections.  CULTURE, BLOOD (ROUTINE X 2)     Status: None   Collection Time    04/13/13  4:15 AM      Result Value Range Status   Specimen Description  BLOOD RIGHT HAND   Final   Special Requests BOTTLES DRAWN AEROBIC ONLY 5CC   Final   Culture  Setup Time 04/13/2013 09:36   Final   Culture     Final   Value:        BLOOD CULTURE RECEIVED NO GROWTH TO DATE CULTURE WILL BE HELD FOR 5 DAYS BEFORE ISSUING A FINAL NEGATIVE REPORT   Report Status PENDING   Incomplete  CULTURE, BLOOD (ROUTINE X 2)     Status: None   Collection Time    04/13/13  4:40 AM      Result Value Range Status   Specimen Description BLOOD RIGHT HAND   Final   Special Requests BOTTLES DRAWN AEROBIC ONLY 3CC   Final   Culture  Setup Time 04/13/2013 09:36   Final   Culture     Final   Value:        BLOOD CULTURE RECEIVED NO GROWTH TO DATE CULTURE WILL BE HELD FOR 5 DAYS BEFORE ISSUING A FINAL NEGATIVE REPORT   Report Status PENDING   Incomplete  ANAEROBIC CULTURE     Status: None   Collection Time  04/15/13  8:00 AM      Result Value Range Status   Specimen Description ABSCESS LEFT GROIN   Final   Special Requests PATIENT ON FOLLOWING ZOSYN VANCOMYCIN   Final   Gram Stain     Final   Value: MODERATE WBC PRESENT, PREDOMINANTLY PMN     NO SQUAMOUS EPITHELIAL CELLS SEEN     NO ORGANISMS SEEN   Culture     Final   Value: NO ANAEROBES ISOLATED; CULTURE IN PROGRESS FOR 5 DAYS   Report Status PENDING   Incomplete  CULTURE, ROUTINE-ABSCESS     Status: None   Collection Time    04/15/13  8:00 AM      Result Value Range Status   Specimen Description ABSCESS LEFT GROIN   Final   Special Requests PATIENT ON FOLLOWING ZOSYN VANCOMYCIN   Final   Gram Stain     Final   Value: MODERATE WBC PRESENT, PREDOMINANTLY PMN     NO SQUAMOUS EPITHELIAL CELLS SEEN     NO ORGANISMS SEEN   Culture Culture reincubated for better growth   Final   Report Status PENDING   Incomplete    Studies/Results: No results found.  Assessment/Plan: POD #3 scrotal abscess debridement  Scrotal wound debridement by Dr. Patsi Sears. Maintain foley at this time until surgical wound is healed.  Continue dressing changes as directed by General Surgery.   LOS: 4 days   [facility] 04/17/2013, 7:03 AM

## 2013-04-17 NOTE — Progress Notes (Signed)
2 Days Post-Op  Subjective: comfortable  Objective: Vital signs in last 24 hours: Temp:  [98.5 F (36.9 C)-101 F (38.3 C)] 98.9 F (37.2 C) (06/10 0754) Pulse Rate:  [88-109] 106 (06/10 0754) Resp:  [15-24] 23 (06/10 0754) BP: (74-109)/(40-58) 100/49 mmHg (06/10 0754) SpO2:  [96 %-98 %] 98 % (06/10 0754) Last BM Date: 04/14/13  Intake/Output from previous day: 06/09 0701 - 06/10 0700 In: 3028 [I.V.:2378; Blood:525; IV Piggyback:125] Out: 2100 [Urine:2100] Intake/Output this shift:    Wounds stable, mild drainage  Lab Results:   Recent Labs  04/16/13 0950 04/16/13 2039 04/17/13 0505  WBC 23.4*  --  17.4*  HGB 6.7* 8.6* 8.5*  HCT 21.0* 25.5* 24.8*  PLT 387  --  325   BMET  Recent Labs  04/16/13 0808 04/17/13 0505  NA 134* 135  K 4.6 3.7  CL 98 103  CO2 27 27  GLUCOSE 145* 62*  BUN 13 9  CREATININE 0.79 0.66  CALCIUM 8.0* 8.3*   PT/INR No results found for this basename: LABPROT, INR,  in the last 72 hours ABG No results found for this basename: PHART, PCO2, PO2, HCO3,  in the last 72 hours  Studies/Results: No results found.  Anti-infectives: Anti-infectives   Start     Dose/Rate Route Frequency Ordered Stop   04/17/13 1000  vancomycin (VANCOCIN) 1,250 mg in sodium chloride 0.9 % 250 mL IVPB     1,250 mg 166.7 mL/hr over 90 Minutes Intravenous Every 24 hours 04/16/13 1146     04/14/13 1200  vancomycin (VANCOCIN) IVPB 1000 mg/200 mL premix  Status:  Discontinued     1,000 mg 200 mL/hr over 60 Minutes Intravenous Every 24 hours 04/14/13 0901 04/16/13 1146   04/13/13 1200  vancomycin (VANCOCIN) 1,500 mg in sodium chloride 0.9 % 500 mL IVPB  Status:  Discontinued     1,500 mg 250 mL/hr over 120 Minutes Intravenous Every 24 hours 04/13/13 0454 04/14/13 0901   04/13/13 0600  piperacillin-tazobactam (ZOSYN) IVPB 3.375 g     3.375 g 12.5 mL/hr over 240 Minutes Intravenous Every 8 hours 04/13/13 0454     04/13/13 0500  piperacillin-tazobactam (ZOSYN)  IVPB 3.375 g  Status:  Discontinued     3.375 g 100 mL/hr over 30 Minutes Intravenous  Once 04/13/13 0454 04/13/13 0504      Assessment/Plan: s/p Procedure(s) with comments: IRRIGATION AND DEBRIDEMENT ABSCESS (N/A) - scrotum and penis INCISION AND DRAINAGE ABSCESS DRESSING CHANGE UNDER ANESTHESIA  Continue wound care, antibiotics  LOS: 4 days    Jammy Stlouis A 04/17/2013

## 2013-04-18 LAB — GLUCOSE, CAPILLARY
Glucose-Capillary: 170 mg/dL — ABNORMAL HIGH (ref 70–99)
Glucose-Capillary: 129 mg/dL — ABNORMAL HIGH (ref 70–99)

## 2013-04-18 LAB — PREPARE RBC (CROSSMATCH)

## 2013-04-18 LAB — BASIC METABOLIC PANEL
BUN: 8 mg/dL (ref 6–23)
Chloride: 103 mEq/L (ref 96–112)
Glucose, Bld: 196 mg/dL — ABNORMAL HIGH (ref 70–99)
Potassium: 3.7 mEq/L (ref 3.5–5.1)

## 2013-04-18 LAB — CBC
HCT: 22.9 % — ABNORMAL LOW (ref 39.0–52.0)
Hemoglobin: 7.6 g/dL — ABNORMAL LOW (ref 13.0–17.0)
MCH: 30.8 pg (ref 26.0–34.0)
MCHC: 33.7 g/dL (ref 30.0–36.0)
MCV: 91.2 fL (ref 78.0–100.0)
MCV: 91.5 fL (ref 78.0–100.0)
Platelets: 322 10*3/uL (ref 150–400)
RDW: 15.1 % (ref 11.5–15.5)
WBC: 14.5 10*3/uL — ABNORMAL HIGH (ref 4.0–10.5)

## 2013-04-18 LAB — CULTURE, ROUTINE-ABSCESS

## 2013-04-18 MED ORDER — ENSURE COMPLETE PO LIQD
237.0000 mL | Freq: Three times a day (TID) | ORAL | Status: DC
  Administered 2013-04-18 – 2013-04-20 (×8): 237 mL via ORAL

## 2013-04-18 NOTE — Progress Notes (Signed)
3 Days Post-Op  Subjective: Appears comfortable, not complaints prior to dressing change.  He did not complain during the dressing change and was very cooperative.  Objective: Vital signs in last 24 hours: Temp:  [98.4 F (36.9 C)-99.1 F (37.3 C)] 99 F (37.2 C) (06/11 0343) Pulse Rate:  [90-108] 98 (06/11 0600) Resp:  [16-29] 22 (06/11 0600) BP: (92-117)/(44-64) 109/60 mmHg (06/11 0600) SpO2:  [94 %-100 %] 98 % (06/11 0600) Weight:  [51.5 kg (113 lb 8.6 oz)] 51.5 kg (113 lb 8.6 oz) (06/11 0005) Last BM Date: 04/14/13  940 PO recorded,  Diet: DIII Afebrile, BP a little soft, but stable, HR 90's-100's range H/h down today some.  Intake/Output from previous day: 06/10 0701 - 06/11 0700 In: 4043 [P.O.:940; I.V.:3003; IV Piggyback:100] Out: 4300 [Urine:4300] Intake/Output this shift:    General appearance: alert, cooperative and no distress Skin: Right groin has some drainage from a 1 cm opening.  We will start using some iodoform for this .  The left groing is open and clean.Scrotal debridement is clean, and looks good.  The left and right buttocks is clean. There are some small pockets forming away from major incisions.  I have ask nursing to clean these with soap and water.  Just keep an eye on them.  No need for further surgical debridement.  Lab Results:   Recent Labs  04/17/13 0505 04/18/13 0240  WBC 17.4* 14.5*  HGB 8.5* 7.6*  HCT 24.8* 22.9*  PLT 325 334    BMET  Recent Labs  04/17/13 0505 04/18/13 0240  NA 135 136  K 3.7 3.7  CL 103 103  CO2 27 28  GLUCOSE 62* 196*  BUN 9 8  CREATININE 0.66 0.56  CALCIUM 8.3* 8.3*   PT/INR No results found for this basename: LABPROT, INR,  in the last 72 hours   Recent Labs Lab 04/13/13 0420  AST 44*  ALT 29  ALKPHOS 229*  BILITOT 0.4  PROT 10.1*  ALBUMIN 1.7*     Lipase  No results found for this basename: lipase     Studies/Results: No results found.  Medications: . allopurinol  300 mg Oral  Daily  . antiseptic oral rinse  15 mL Mouth Rinse BID  . atorvastatin  80 mg Oral Daily  . clopidogrel  75 mg Oral Q breakfast  . feeding supplement  237 mL Oral BID BM  . ferrous sulfate  325 mg Oral BID  . gabapentin  600 mg Oral QHS  . insulin aspart  0-15 Units Subcutaneous TID WC  . magnesium oxide  400 mg Oral TID  . mirtazapine  15 mg Oral QHS  . multivitamin with minerals  1 tablet Oral Daily  . piperacillin-tazobactam (ZOSYN)  IV  3.375 g Intravenous Q8H  . polyethylene glycol  17 g Oral Daily  . QUEtiapine  200 mg Oral QHS  . risperiDONE  3 mg Oral BID  . sodium chloride  3 mL Intravenous Q12H  . vancomycin  1,250 mg Intravenous Q24H    Assessment/Plan Recurrent hidradenitis, Removal of condom catheter and placement of 16 French Foley catheter (5 cc balloon)  Scrotal expiration with excision of multiple scrotal necrotic areas consistent with hidradenitis. Further I/d of bilateral perineal abscess. Dr. Donell Beers and Tannenbaum6/8/14  IRRIGATION AND NON-EXCISIONAL DEBRIDEMENT ABSCESS left groin abscess, 04/07/2013, Cherylynn Ridges, MD.  Fever up to 101 now.  Schizophrenia - Resides with his family  History of CVA with left-sided hemiplegia - on antiplatelet agents.  History of colon cancer status post hemicolectomy  AODM  History of gout  Chronic anemia  Code status: DNR (Palliative consult with plans for transfer to LTAC.)   PLan:  Continue dressing changes, and follow while he is here.  Antibiotics/transfusions per medicine.  LOS: 5 days    Xavier Khan 04/18/2013

## 2013-04-18 NOTE — Progress Notes (Signed)
NUTRITION FOLLOW UP  DOCUMENTATION CODES  Per approved criteria  -Non-severe (moderate) malnutrition in the context of acute illness or injury    Intervention:   1.  Supplements; add Magic cup BID on trays to be given with medications. Increase Ensure to TID.  2.  Meals/snacks; recommend medication in ice cream, Magic Cup, or taken with other supplement for increased kcal intake. 3.  Please obtain new wt  Nutrition Dx:   Increased nutrient needs, ongoing   Monitor:   1.  Food/Beverage; improvement in intake sufficient to meet >/=90% estimated needs.  Assessment:   Pt admitted with worsening drainage from ground wound.  Pt with h/o Hidradentitis suppurativa.  Pt answers few questions a visit. Discussed with RN who reports pt continues to eat best with family achieving up to 25% meal completion when sister feeds.  He does take Ensure BID well.  Encouraged use of supplements or high calorie foods for medications. Palliative following. Pt with wt change since admission (recommend re-weigh when able to confirm), and documented poor intake.  Pt at risk for developing severe malnutrition of acute illness.  RD to follow and continue with appropriate interventions.   Height: Ht Readings from Last 1 Encounters:  04/13/13 5\' 4"  (1.626 m)    Weight Status:   Wt Readings from Last 1 Encounters:  04/18/13 113 lb 8.6 oz (51.5 kg)  Note change from 121 lbs on admission.   Re-estimated needs:  Kcal: 1650-1770 Protein: 75-85g Fluid: >1.6 L/day  Skin: wounds to axilla, groin, buttocks, and thigh  Diet Order: Dysphagia 3, thin   Intake/Output Summary (Last 24 hours) at 04/18/13 1124 Last data filed at 04/18/13 0824  Gross per 24 hour  Intake   3420 ml  Output   3900 ml  Net   -480 ml    Last BM: 6/11   Labs:   Recent Labs Lab 04/16/13 0808 04/17/13 0505 04/18/13 0240  NA 134* 135 136  K 4.6 3.7 3.7  CL 98 103 103  CO2 27 27 28   BUN 13 9 8   CREATININE 0.79 0.66 0.56   CALCIUM 8.0* 8.3* 8.3*  GLUCOSE 145* 62* 196*    CBG (last 3)   Recent Labs  04/17/13 2302 04/18/13 0341 04/18/13 0805  GLUCAP 271* 170* 129*    Scheduled Meds: . allopurinol  300 mg Oral Daily  . antiseptic oral rinse  15 mL Mouth Rinse BID  . atorvastatin  80 mg Oral Daily  . clopidogrel  75 mg Oral Q breakfast  . feeding supplement  237 mL Oral BID BM  . ferrous sulfate  325 mg Oral BID  . gabapentin  600 mg Oral QHS  . insulin aspart  0-15 Units Subcutaneous TID WC  . magnesium oxide  400 mg Oral TID  . mirtazapine  15 mg Oral QHS  . multivitamin with minerals  1 tablet Oral Daily  . piperacillin-tazobactam (ZOSYN)  IV  3.375 g Intravenous Q8H  . polyethylene glycol  17 g Oral Daily  . QUEtiapine  200 mg Oral QHS  . risperiDONE  3 mg Oral BID  . sodium chloride  3 mL Intravenous Q12H  . vancomycin  1,250 mg Intravenous Q24H    Continuous Infusions: . sodium chloride 125 mL/hr at 04/18/13 0800    Loyce Dys, MS RD LDN Clinical Inpatient Dietitian Pager: [phone] Weekend/After hours pager: [phone]

## 2013-04-18 NOTE — Progress Notes (Signed)
[facility] PROGRESS NOTE  Xavier Khan ZOX:[id number] DOB: 01/12/1956 DOA: 04/13/2013 PCP: No PCP Per Patient  Brief Narrative Xavier Khan is a 57/M with schizophrenia, mild dementia, DM, CVA, CAD, Colon cancer s/p hemicolectomy, chronic hydradenitis was discharged 2 prior to admission from our facility after being treated for acute on chronic worsening of hidradenitis suppurativa of the groin area and had I&D done by surgery on 5/31 was placed on antibiotics and discharged to nursing home was found to have increasing discharge with leukocytosis and initially mildly hypotensive with tachycardia.  On evaluation he had multiple groin wounds now extending to the scrotum, with small openings and discharge. He was seen by CCS and URology in consultation and after multiple discussions with his sister [facility], he was taken to the OR and underwent Scrotal expiration with excision of multiple scrotal necrotic areas consistent with hidradenitis by Dr.tannenbaum and debridement by dr.Byerly of the perineal small multifocal abscesses. Post op 6/9 was febrile and lethargic with bleeding from his wounds. Continues to be on broad spectrum abx and received 2 units PRBC now. S/p Palliative meeting 6/9, want full scope of treatment  Assessment/Plan:  1. Acute on chronic Hidradenitis suppuritiva of the groin area with multifocal abscesses with scrotal involvement.      -recent debridement of groin 5/31      -continue vancomycin and Zosyn Day 4      -CCS/URology following      -s/p scrotal debridement 6/8 and Perineal debridement as well by [facility]CS at the same time.      -Dr.Tannenbaum had discussions with pts sister/ PoA pre op       -On 6/9 with s/s of sepsis again, FU blood Cx x2, IVF, lactic acid normal     -FU wound cultures from OR, gram stain no organisms seen      -Given poor prognosis and recurrent abscesses with hydradenitis and high likelihood of requiring multiple surgeries in future, d/w sister  and agreeable for a Palliative consult for Goals of care, s/p meeting  and want FULL SCOPE of treatment possible  2. Sepsis: due to 1 -abx, management as noted above  3. History of CAD status post stenting      -stable . 4. Schizophrenia -      - continue Seroquel/Risperdone      - cogentin stopped      -Sister is PoA, pt does not have capacity to make decisions  5. History of CVA with left-sided hemiplegia - on antiplatelet agents.      -DC Asa/continue plavix  6. Chronic anemia        -due to blood loss from 1, chronic disease        -transfused 2 units PRBC 6/9       -CBC Q12 AND I UNIT ordered today.   7. History of gout - continue allopurinol.  8. History of colon cancer status post hemicolectomy  9.  DM: SSI for now  10. Protein calorie malnutrition: -Nutrition consult -Ensure BID  DVt proph: SCDs  Code status: DNR Called and updated sister [name] yesterday by MD Disposition: keep in SDU   Consultants:  CCS  Urology  Palliative medicine  Antibiotics:  VAnc 6/6  Zosyn 6/6  HPI/Subjective: More lucid today, continues to have oozing from wounds . Po intake poor  Objective: Filed Vitals:   04/18/13 0200 04/18/13 0343 04/18/13 0400 04/18/13 0600  BP: 94/48 93/45 92/44  109/60  Pulse: 92 90 94 98  Temp:  99 F (37.2  C)    TempSrc:  Oral    Resp: 19 20 20 22   Height:      Weight:      SpO2: 96% 94% 94% 98%    Intake/Output Summary (Last 24 hours) at 04/18/13 0720 Last data filed at 04/18/13 0700  Gross per 24 hour  Intake   4043 ml  Output   4300 ml  Net   -257 ml   Filed Weights   04/15/13 0408 04/16/13 0010 04/18/13 0005  Weight: 55 kg (121 lb 4.1 oz) 55 kg (121 lb 4.1 oz) 51.5 kg (113 lb 8.6 oz)    Exam:   General:  Awake, alert and talking, improvement from yesterday, oriented to self and placeo self only  Cardiovascular: S1S2/RRR  Respiratory: CTAB  Abdomen: soft, NT, BS present  Musculoskeletal: no edema  c/c  Perineum:  Medial thigh wounds,  Base of penis and Scrotal thickening with bleeding/purulent drainage covered with WTD dressing  Axilla: with hydradenitis with minimal purulence  Data Reviewed: Basic Metabolic Panel:  Recent Labs Lab 04/13/13 1615 04/14/13 0550 04/16/13 0808 04/17/13 0505 04/18/13 0240  NA 133* 136 134* 135 136  K 3.7 3.6 4.6 3.7 3.7  CL 96 98 98 103 103  CO2 30 30 27 27 28   GLUCOSE 240* 126* 145* 62* 196*  BUN 9 7 13 9 8   CREATININE 0.46* 0.62 0.79 0.66 0.56  CALCIUM 8.4 8.9 8.0* 8.3* 8.3*   Liver Function Tests:  Recent Labs Lab 04/13/13 0420  AST 44*  ALT 29  ALKPHOS 229*  BILITOT 0.4  PROT 10.1*  ALBUMIN 1.7*   No results found for this basename: LIPASE, AMYLASE,  in the last 168 hours No results found for this basename: AMMONIA,  in the last 168 hours CBC:  Recent Labs Lab 04/13/13 0708 04/14/13 0550 04/15/13 1604 04/16/13 0950 04/16/13 2039 04/17/13 0505 04/18/13 0240  WBC 16.5* 16.5* 16.7* 23.4*  --  17.4* 14.5*  NEUTROABS 14.3*  --   --   --   --   --   --   HGB 8.6* 8.7* 8.0* 6.7* 8.6* 8.5* 7.6*  HCT 26.2* 27.0* 25.3* 21.0* 25.5* 24.8* 22.9*  MCV 91.9 93.8 94.8 93.8  --  89.9 91.2  PLT 453* 461* 392 387  --  325 334   Cardiac Enzymes: No results found for this basename: CKTOTAL, CKMB, CKMBINDEX, TROPONINI,  in the last 168 hours BNP (last 3 results) No results found for this basename: PROBNP,  in the last 8760 hours CBG:  Recent Labs Lab 04/17/13 1151 04/17/13 1715 04/17/13 1939 04/17/13 2302 04/18/13 0341  GLUCAP 111* 203* 219* 271* 170*    Recent Results (from the past 240 hour(s))  MRSA PCR SCREENING     Status: None   Collection Time    04/13/13  3:29 AM      Result Value Range Status   MRSA by PCR NEGATIVE  NEGATIVE Final   Comment:            The GeneXpert MRSA Assay (FDA     approved for NASAL specimens     only), is one component of a     comprehensive MRSA colonization     surveillance  program. It is not     intended to diagnose MRSA     infection nor to guide or     monitor treatment for     MRSA infections.  CULTURE, BLOOD (ROUTINE X 2)     Status:  None   Collection Time    04/13/13  4:15 AM      Result Value Range Status   Specimen Description BLOOD RIGHT HAND   Final   Special Requests BOTTLES DRAWN AEROBIC ONLY 5CC   Final   Culture  Setup Time 04/13/2013 09:36   Final   Culture     Final   Value:        BLOOD CULTURE RECEIVED NO GROWTH TO DATE CULTURE WILL BE HELD FOR 5 DAYS BEFORE ISSUING A FINAL NEGATIVE REPORT   Report Status PENDING   Incomplete  CULTURE, BLOOD (ROUTINE X 2)     Status: None   Collection Time    04/13/13  4:40 AM      Result Value Range Status   Specimen Description BLOOD RIGHT HAND   Final   Special Requests BOTTLES DRAWN AEROBIC ONLY 3CC   Final   Culture  Setup Time 04/13/2013 09:36   Final   Culture     Final   Value:        BLOOD CULTURE RECEIVED NO GROWTH TO DATE CULTURE WILL BE HELD FOR 5 DAYS BEFORE ISSUING A FINAL NEGATIVE REPORT   Report Status PENDING   Incomplete  ANAEROBIC CULTURE     Status: None   Collection Time    04/15/13  8:00 AM      Result Value Range Status   Specimen Description ABSCESS LEFT GROIN   Final   Special Requests PATIENT ON FOLLOWING ZOSYN VANCOMYCIN   Final   Gram Stain     Final   Value: MODERATE WBC PRESENT, PREDOMINANTLY PMN     NO SQUAMOUS EPITHELIAL CELLS SEEN     NO ORGANISMS SEEN   Culture     Final   Value: NO ANAEROBES ISOLATED; CULTURE IN PROGRESS FOR 5 DAYS   Report Status PENDING   Incomplete  CULTURE, ROUTINE-ABSCESS     Status: None   Collection Time    04/15/13  8:00 AM      Result Value Range Status   Specimen Description ABSCESS LEFT GROIN   Final   Special Requests PATIENT ON FOLLOWING ZOSYN VANCOMYCIN   Final   Gram Stain     Final   Value: MODERATE WBC PRESENT, PREDOMINANTLY PMN     NO SQUAMOUS EPITHELIAL CELLS SEEN     NO ORGANISMS SEEN   Culture FEW PROTEUS SPECIES    Final   Report Status PENDING   Incomplete  CULTURE, BLOOD (ROUTINE X 2)     Status: None   Collection Time    04/16/13  8:00 AM      Result Value Range Status   Specimen Description BLOOD RIGHT FOREARM   Final   Special Requests BOTTLES DRAWN AEROBIC AND ANAEROBIC 7CC   Final   Culture  Setup Time 04/16/2013 14:09   Final   Culture     Final   Value:        BLOOD CULTURE RECEIVED NO GROWTH TO DATE CULTURE WILL BE HELD FOR 5 DAYS BEFORE ISSUING A FINAL NEGATIVE REPORT   Report Status PENDING   Incomplete  CULTURE, BLOOD (ROUTINE X 2)     Status: None   Collection Time    04/16/13  8:08 AM      Result Value Range Status   Specimen Description BLOOD RIGHT HAND   Final   Special Requests BOTTLES DRAWN AEROBIC ONLY [facility]   Final   Culture  Setup Time 04/16/2013 14:13  Final   Culture     Final   Value:        BLOOD CULTURE RECEIVED NO GROWTH TO DATE CULTURE WILL BE HELD FOR 5 DAYS BEFORE ISSUING A FINAL NEGATIVE REPORT   Report Status PENDING   Incomplete     Studies: No results found.  Scheduled Meds: . allopurinol  300 mg Oral Daily  . antiseptic oral rinse  15 mL Mouth Rinse BID  . atorvastatin  80 mg Oral Daily  . clopidogrel  75 mg Oral Q breakfast  . feeding supplement  237 mL Oral BID BM  . ferrous sulfate  325 mg Oral BID  . gabapentin  600 mg Oral QHS  . insulin aspart  0-15 Units Subcutaneous TID WC  . magnesium oxide  400 mg Oral TID  . mirtazapine  15 mg Oral QHS  . multivitamin with minerals  1 tablet Oral Daily  . piperacillin-tazobactam (ZOSYN)  IV  3.375 g Intravenous Q8H  . polyethylene glycol  17 g Oral Daily  . QUEtiapine  200 mg Oral QHS  . risperiDONE  3 mg Oral BID  . sodium chloride  3 mL Intravenous Q12H  . vancomycin  1,250 mg Intravenous Q24H   Continuous Infusions: . sodium chloride 125 mL/hr at 04/18/13 4098    Principal Problem:   Cellulitis Active Problems:   CAD (coronary artery disease)   Hidradenitis suppurativa   Schizophrenia    History of CVA (cerebrovascular accident)   Anemia   Gout   Malnutrition of moderate degree   Sepsis   Hypotension, unspecified   Unspecified constipation   Dysphagia, pharyngoesophageal phase    Time spent:34min    Buell Parcel  [facility] Pager [phone]. If 7PM-7AM, please contact night-coverage at [email or web address], password [facility] 04/18/2013, 7:20 AM  LOS: 5 days

## 2013-04-18 NOTE — Progress Notes (Signed)
I have seen and examined the patient and agree with the assessment and plans.  Nicolasa Milbrath A. Akisha Sturgill  MD, FACS  

## 2013-04-18 NOTE — Progress Notes (Signed)
Physical Therapy Wound Treatment Patient Details  Name: Xavier Khan MRN: [id number] Date of Birth: 08/29/1956  Today's Date: 04/18/2013 Time: 1425-1536 Time Calculation (min): 71 min  Subjective  Subjective: I'm okay... Patient and Family Stated Goals: no goal stated--pt agreed we needed to get it clean Date of Onset:  (prior to admission) Prior Treatments: prior hydrotherapy and nursing managed wound care ( )  Pain Score:    Wound Assessment  Wound 04/01/13 Other (Comment) Multiple wound sites: Axilla, groin, Buttocks, thigh (Active)  Site / Wound Assessment Bleeding;Pink;Red 04/18/2013  3:51 PM  % Wound base Red or Granulating 95% 04/18/2013  3:51 PM  % Wound base Yellow 5% 04/18/2013  3:51 PM  % Wound base Black 5% 04/10/2013 11:31 AM  Peri-wound Assessment Erythema (blanchable) 04/18/2013  3:51 PM  Wound Length (cm) 7.3 cm 04/09/2013 11:29 AM  Wound Width (cm) 5 cm 04/09/2013 11:29 AM  Wound Depth (cm) 1.2 cm 04/09/2013 11:29 AM  Undermining (cm) 1 oclock 4.5 cm, 4-6 oclock 2.0 cm 04/09/2013 11:29 AM  Margins Unattacted edges (unapproximated) 04/18/2013  3:51 PM  Closure None 04/18/2013  3:51 PM  Drainage Amount Moderate 04/18/2013  3:51 PM  Drainage Description Purulent;Sanguineous 04/18/2013  3:51 PM  Non-staged Wound Description Not applicable 04/18/2013  3:51 PM  Treatment Cleansed;Debridement (Selective);Hydrotherapy (Pulse lavage);Packing (Saline gauze) 04/18/2013  3:51 PM  Dressing Type Abdominal binder;Gauze (Comment);Moist to dry 04/18/2013  3:51 PM  Dressing Changed Changed 04/18/2013  3:51 PM  Dressing Status Clean;Dry;Intact 04/18/2013  3:51 PM     Incision 04/07/13 Groin Left (Active)  Site / Wound Assessment Clean 04/18/2013  7:45 AM  Incision Length (cm) 2 cm 04/13/2013  3:55 AM  Margins Unattacted edges (unapproximated) 04/18/2013  7:45 AM  Closure None 04/18/2013  7:45 AM  Drainage Amount Minimal 04/18/2013  7:45 AM  Drainage Description Sanguineous 04/18/2013  7:45 AM  Treatment  Cleansed 04/18/2013  7:45 AM  Dressing Type Gauze (Comment) 04/18/2013  7:45 AM  Dressing Changed 04/18/2013  7:45 AM     Incision 04/07/13 Buttocks Left (Active)  Site / Wound Assessment Bleeding 04/18/2013  3:51 PM  Incision Length (cm) 10 cm 04/13/2013  3:55 AM  Margins Unattacted edges (unapproximated) 04/18/2013  3:51 PM  Closure None 04/18/2013  3:51 PM  Drainage Amount Copious 04/18/2013  3:51 PM  Drainage Description Sanguineous 04/18/2013  3:51 PM  Treatment Cleansed 04/18/2013  7:45 AM  Dressing Type ABD;Gauze (Comment) 04/18/2013  3:51 PM  Dressing Changed;Clean;Dry;Intact 04/18/2013  3:51 PM     Incision 04/15/13 Perineum (Active)  Site / Wound Assessment Bleeding 04/18/2013  3:51 PM  Margins Unattacted edges (unapproximated) 04/18/2013  3:51 PM  Closure None 04/18/2013  3:51 PM  Drainage Amount Moderate 04/18/2013  3:51 PM  Drainage Description Sanguineous;Purulent 04/18/2013  3:51 PM  Treatment Cleansed 04/18/2013  3:51 PM  Dressing Type ABD;Gauze (Comment) 04/18/2013  3:51 PM  Dressing Changed 04/18/2013  3:51 PM     Incision 04/15/13 Scrotum (Active)  Site / Wound Assessment Bleeding 04/18/2013  3:51 PM  Margins Unattacted edges (unapproximated) 04/18/2013  3:51 PM  Closure None 04/18/2013  3:51 PM  Drainage Amount Copious 04/18/2013  3:51 PM  Drainage Description Sanguineous 04/18/2013  3:51 PM  Treatment Cleansed 04/18/2013  7:45 AM  Dressing Type Gauze (Comment);ABD 04/18/2013  3:51 PM  Dressing Changed 04/18/2013  3:51 PM     Incision 04/15/13 Perineum (Active)  Site / Wound Assessment Bleeding 04/18/2013  3:51 PM  Margins Unattacted edges (unapproximated) 04/18/2013  3:51 PM  Closure None 04/18/2013  3:51 PM  Drainage Amount Moderate 04/18/2013  3:51 PM  Drainage Description Serosanguineous;Purulent 04/18/2013  3:51 PM  Treatment Cleansed 04/18/2013  3:51 PM  Dressing Type ABD;Gauze (Comment) 04/18/2013  3:51 PM  Dressing Changed 04/18/2013  3:51 PM   Hydrotherapy Pulsed lavage therapy  - wound location: l hip, L and R perineum, scrotum Pulsed Lavage with Suction (psi): 4 psi Pulsed Lavage with Suction - Normal Saline Used: 2000 mL Pulsed Lavage Tip: Tip with splash shield Selective Debridement Selective Debridement - Tools Used: Forceps;Scissors Selective Debridement - Tissue Removed: yellow eschar   Wound Assessment and Plan  Wound Therapy - Assess/Plan/Recommendations Wound Therapy - Clinical Statement: PLS to cleanse all wounds, selective debridement of left over yellow necrosis and preparation for healing. Wound Therapy - Functional Problem List: immobility Factors Delaying/Impairing Wound Healing: Immobility;Multiple medical problems Hydrotherapy Plan: Debridement;Patient/family education;Pulsatile lavage with suction Wound Therapy - Frequency: 6X / week Wound Therapy - Follow Up Recommendations: Skilled nursing facility Wound Plan: promote healthy wound with PLS to cleanse and selective debridement to prep. for healing,.  Wound Therapy Goals- Improve the function of patient's integumentary system by progressing the wound(s) through the phases of wound healing (inflammation - proliferation - remodeling) by: Decrease Necrotic Tissue to: 0% Decrease Necrotic Tissue - Progress: Progressing toward goal Increase Granulation Tissue to: 100% Increase Granulation Tissue - Progress: Progressing toward goal Improve Drainage Characteristics: Min;Serous Improve Drainage Characteristics - Progress: Not met Time For Goal Achievement: 2 weeks Wound Therapy - Potential for Goals: Good  Goals will be updated until maximal potential achieved or discharge criteria met.  Discharge criteria: when goals achieved, discharge from hospital, MD decision/surgical intervention, no progress towards goals, refusal/missing three consecutive treatments without notification or medical reason.  GP     Katrenia Alkins, Eliseo Gum 04/18/2013, 3:58 PM 04/18/2013   Bing,  PT [phone]  (pager)

## 2013-04-19 LAB — TYPE AND SCREEN
Antibody Screen: NEGATIVE
Unit division: 0

## 2013-04-19 LAB — GLUCOSE, CAPILLARY
Glucose-Capillary: 108 mg/dL — ABNORMAL HIGH (ref 70–99)
Glucose-Capillary: 190 mg/dL — ABNORMAL HIGH (ref 70–99)
Glucose-Capillary: 307 mg/dL — ABNORMAL HIGH (ref 70–99)
Glucose-Capillary: 182 mg/dL — ABNORMAL HIGH (ref 70–99)

## 2013-04-19 LAB — CBC
Hemoglobin: 9.2 g/dL — ABNORMAL LOW (ref 13.0–17.0)
MCH: 30.4 pg (ref 26.0–34.0)
MCH: 30.6 pg (ref 26.0–34.0)
MCHC: 32.8 g/dL (ref 30.0–36.0)
MCHC: 33.2 g/dL (ref 30.0–36.0)
MCV: 92.6 fL (ref 78.0–100.0)
Platelets: 342 10*3/uL (ref 150–400)
RBC: 2.96 MIL/uL — ABNORMAL LOW (ref 4.22–5.81)

## 2013-04-19 LAB — CULTURE, BLOOD (ROUTINE X 2): Culture: NO GROWTH

## 2013-04-19 MED ORDER — DEXTROSE 5 % IV SOLN: 1.0000 g | Freq: Three times a day (TID) | INTRAVENOUS | Status: DC

## 2013-04-19 MED ORDER — ONDANSETRON HCL 4 MG/2ML IJ SOLN: 4.0000 mg | Freq: Four times a day (QID) | INTRAMUSCULAR | Status: DC | PRN

## 2013-04-19 MED ORDER — INSULIN ASPART 100 UNIT/ML ~~LOC~~ SOLN: 0.0000 [IU] | Freq: Three times a day (TID) | SUBCUTANEOUS | Status: AC

## 2013-04-19 MED ORDER — ACETAMINOPHEN 325 MG PO TABS: 650.0000 mg | ORAL_TABLET | Freq: Four times a day (QID) | ORAL | Status: AC | PRN

## 2013-04-19 MED ORDER — DEXTROSE 5 % IV SOLN
1.0000 g | Freq: Three times a day (TID) | INTRAVENOUS | Status: DC
  Administered 2013-04-19 – 2013-04-20 (×4): 1 g via INTRAVENOUS
  Filled 2013-04-19 (×6): qty 1

## 2013-04-19 NOTE — Progress Notes (Signed)
Physical Therapy Wound Treatment Patient Details  Name: Xavier Khan MRN: [id number] Date of Birth: 07/02/1956  Today's Date: 04/19/2013 Time: 1112-1207 Time Calculation (min): 55 min  Subjective  Patient and Family Stated Goals: no goal stated--pt agreed we needed to get it clean Date of Onset:  (prior to admission) Prior Treatments: prior hydrotherapy and nursing managed wound care ( )  Pain Score:    Wound Assessment  Wound 04/01/13 Other (Comment) Multiple wound sites: Axilla, groin, Buttocks, thigh (Active)  Site / Wound Assessment Bleeding;Pink;Red 04/19/2013 12:10 PM  % Wound base Red or Granulating 95% 04/18/2013 11:10 PM  % Wound base Yellow 5% 04/18/2013 11:10 PM  % Wound base Black 5% 04/10/2013 11:31 AM  Peri-wound Assessment Erythema (blanchable) 04/19/2013 12:10 PM  Wound Length (cm) 7.3 cm 04/09/2013 11:29 AM  Wound Width (cm) 5 cm 04/09/2013 11:29 AM  Wound Depth (cm) 1.2 cm 04/09/2013 11:29 AM  Undermining (cm) 1 oclock 4.5 cm, 4-6 oclock 2.0 cm 04/09/2013 11:29 AM  Margins Unattacted edges (unapproximated) 04/19/2013 12:10 PM  Closure None 04/19/2013 12:10 PM  Drainage Amount Moderate 04/19/2013 12:10 PM  Drainage Description Purulent;Sanguineous 04/19/2013 12:10 PM  Non-staged Wound Description Not applicable 04/19/2013 12:10 PM  Treatment Cleansed 04/18/2013 11:10 PM  Dressing Type Abdominal binder;Gauze (Comment);Moist to dry 04/19/2013 12:10 PM  Dressing Changed Changed 04/18/2013 11:10 PM  Dressing Status Clean;Dry;Intact 04/19/2013 12:10 PM     Incision 04/07/13 Groin Left (Active)  Site / Wound Assessment Clean;Dry;Granulation tissue;Pink 04/18/2013 11:10 PM  Incision Length (cm) 2 cm 04/13/2013  3:55 AM  Margins Unattacted edges (unapproximated) 04/18/2013 11:10 PM  Closure None 04/18/2013 11:10 PM  Drainage Amount Minimal 04/18/2013 11:10 PM  Drainage Description Serosanguineous 04/18/2013 11:10 PM  Treatment Cleansed 04/18/2013 11:10 PM  Dressing Type Gauze (Comment) 04/19/2013   8:00 AM  Dressing Clean;Dry;Intact 04/19/2013  8:00 AM     Incision 04/07/13 Buttocks Left (Active)  Site / Wound Assessment Bleeding 04/19/2013 12:10 PM  Incision Length (cm) 10 cm 04/13/2013  3:55 AM  Margins Unattacted edges (unapproximated) 04/19/2013 12:10 PM  Closure None 04/19/2013 12:10 PM  Drainage Amount Copious 04/19/2013 12:10 PM  Drainage Description Sanguineous 04/19/2013 12:10 PM  Treatment Cleansed 04/19/2013 12:10 PM  Dressing Type ABD;Gauze (Comment) 04/19/2013 12:10 PM  Dressing Changed;Clean;Dry;Intact 04/19/2013 12:10 PM     Incision 04/15/13 Perineum (Active)  Site / Wound Assessment Bleeding 04/19/2013 12:10 PM  Margins Unattacted edges (unapproximated) 04/19/2013 12:10 PM  Closure None 04/19/2013 12:10 PM  Drainage Amount Moderate 04/19/2013 12:10 PM  Drainage Description Sanguineous;Purulent 04/19/2013 12:10 PM  Treatment Cleansed 04/19/2013 12:10 PM  Dressing Type ABD;Gauze (Comment) 04/19/2013 12:10 PM  Dressing Changed 04/19/2013 12:10 PM     Incision 04/15/13 Scrotum (Active)  Site / Wound Assessment Bleeding 04/19/2013 12:10 PM  Margins Unattacted edges (unapproximated) 04/19/2013 12:10 PM  Closure None 04/19/2013 12:10 PM  Drainage Amount Copious 04/19/2013 12:10 PM  Drainage Description Sanguineous 04/19/2013 12:10 PM  Treatment Cleansed 04/19/2013 12:10 PM  Dressing Type Gauze (Comment);ABD 04/19/2013 12:10 PM  Dressing Changed 04/19/2013 12:10 PM     Incision 04/15/13 Perineum (Active)  Site / Wound Assessment Bleeding 04/19/2013 12:10 PM  Margins Unattacted edges (unapproximated) 04/19/2013 12:10 PM  Closure None 04/19/2013 12:10 PM  Drainage Amount Moderate 04/19/2013 12:10 PM  Drainage Description Serosanguineous;Purulent 04/19/2013 12:10 PM  Treatment Cleansed 04/19/2013 12:10 PM  Dressing Type ABD;Gauze (Comment) 04/19/2013 12:10 PM  Dressing Changed 04/19/2013 12:10 PM   Hydrotherapy Pulsed lavage therapy - wound location: l  hip, L and R perineum, scrotum Pulsed  Lavage with Suction (psi): 4 psi Pulsed Lavage with Suction - Normal Saline Used: 2000 mL Pulsed Lavage Tip: Tip with splash shield Selective Debridement Selective Debridement - Location: scrotum Selective Debridement - Tools Used: Forceps;Scissors Selective Debridement - Tissue Removed: yellow eschar   Wound Assessment and Plan  Wound Therapy - Assess/Plan/Recommendations Wound Therapy - Clinical Statement: PLS to cleanse all wounds, selective debridement of left over yellow necrosis and preparation for healing. Wound Therapy - Functional Problem List: immobility Factors Delaying/Impairing Wound Healing: Immobility;Multiple medical problems Hydrotherapy Plan: Debridement;Patient/family education;Pulsatile lavage with suction Wound Therapy - Frequency: 6X / week Wound Therapy - Current Recommendations: Other (comment) (SNF RN for wound care) Wound Therapy - Follow Up Recommendations: Skilled nursing facility Wound Plan: promote healthy wound with PLS to cleanse and selective debridement to prep. for healing,.  Wound Therapy Goals- Improve the function of patient's integumentary system by progressing the wound(s) through the phases of wound healing (inflammation - proliferation - remodeling) by: Decrease Necrotic Tissue to: 0% Decrease Necrotic Tissue - Progress: Progressing toward goal Increase Granulation Tissue to: 100% Increase Granulation Tissue - Progress: Progressing toward goal Improve Drainage Characteristics: Min;Serous Improve Drainage Characteristics - Progress: Other (comment) (wounds are clear, but erruptions keep popping up) Time For Goal Achievement: 2 weeks Wound Therapy - Potential for Goals: Good  Goals will be updated until maximal potential achieved or discharge criteria met.  Discharge criteria: when goals achieved, discharge from hospital, MD decision/surgical intervention, no progress towards goals, refusal/missing three consecutive treatments without notification  or medical reason.  GP     Augusta Hilbert, Eliseo Gum 04/19/2013, 12:17 PM 04/19/2013  Clementon Bing, PT [phone]  (pager)

## 2013-04-19 NOTE — Progress Notes (Signed)
4 Days Post-Op  Subjective: No new changes  Objective: Vital signs in last 24 hours: Temp:  [97.5 F (36.4 C)-98.9 F (37.2 C)] 97.6 F (36.4 C) (06/12 0802) Pulse Rate:  [73-99] 76 (06/12 0802) Resp:  [12-32] 14 (06/12 0802) BP: (86-120)/(49-65) 113/63 mmHg (06/12 0802) SpO2:  [96 %-100 %] 99 % (06/12 0802) Weight:  [112 lb 7 oz (51 kg)] 112 lb 7 oz (51 kg) (06/12 0012) Last BM Date: 04/18/13  Intake/Output from previous day: 06/11 0701 - 06/12 0700 In: 4115 [I.V.:3125; Blood:340; IV Piggyback:650] Out: 4725 [Urine:4725] Intake/Output this shift:    Wounds stable  Lab Results:   Recent Labs  04/18/13 1634 04/19/13 0350  WBC 11.2* 11.5*  HGB 9.4* 9.0*  HCT 27.9* 27.4*  PLT 322 342   BMET  Recent Labs  04/17/13 0505 04/18/13 0240  NA 135 136  K 3.7 3.7  CL 103 103  CO2 27 28  GLUCOSE 62* 196*  BUN 9 8  CREATININE 0.66 0.56  CALCIUM 8.3* 8.3*   PT/INR No results found for this basename: LABPROT, INR,  in the last 72 hours ABG No results found for this basename: PHART, PCO2, PO2, HCO3,  in the last 72 hours  Studies/Results: No results found.  Anti-infectives: Anti-infectives   Start     Dose/Rate Route Frequency Ordered Stop   04/17/13 1000  vancomycin (VANCOCIN) 1,250 mg in sodium chloride 0.9 % 250 mL IVPB     1,250 mg 166.7 mL/hr over 90 Minutes Intravenous Every 24 hours 04/16/13 1146     04/14/13 1200  vancomycin (VANCOCIN) IVPB 1000 mg/200 mL premix  Status:  Discontinued     1,000 mg 200 mL/hr over 60 Minutes Intravenous Every 24 hours 04/14/13 0901 04/16/13 1146   04/13/13 1200  vancomycin (VANCOCIN) 1,500 mg in sodium chloride 0.9 % 500 mL IVPB  Status:  Discontinued     1,500 mg 250 mL/hr over 120 Minutes Intravenous Every 24 hours 04/13/13 0454 04/14/13 0901   04/13/13 0600  piperacillin-tazobactam (ZOSYN) IVPB 3.375 g     3.375 g 12.5 mL/hr over 240 Minutes Intravenous Every 8 hours 04/13/13 0454     04/13/13 0500   piperacillin-tazobactam (ZOSYN) IVPB 3.375 g  Status:  Discontinued     3.375 g 100 mL/hr over 30 Minutes Intravenous  Once 04/13/13 0454 04/13/13 0504      Assessment/Plan: s/p Procedure(s) with comments: IRRIGATION AND DEBRIDEMENT ABSCESS (N/A) - scrotum and penis INCISION AND DRAINAGE ABSCESS DRESSING CHANGE UNDER ANESTHESIA  Continue wound care Again, not a curable problem  LOS: 6 days    Xavier Khan A 04/19/2013

## 2013-04-19 NOTE — Progress Notes (Addendum)
  1600:  Nurse was informed by care coordinator that due to outstanding issues concerning [facility], pt will remain at [facility] care at this time. Care coordinator is actively working resolution, pt will be transported to [facility] once issues have been solved.  1220:  Nurse was informed by care coordinator that [facility] requested information prior to discharge to [facility].  Care coordinator is actively working request, sister aware at bedside   1142: Nurse called report to Delorise Shiner at [facility] at approximately 1123.  Nurse informed receiving nurse that pt recently received 1 mg dilaudid prior to hydrotherapy.  Receiving nurse requested that current IV access remain in place post d/c from facility due to poor access/ pt refusal of additional access.  MD is aware and agreed that current access can remain in place.

## 2013-04-19 NOTE — Discharge Summary (Addendum)
Physician Discharge Summary  Xavier Khan ZOX:[id number] DOB: 1956/05/24 DOA: 04/13/2013  PCP: No PCP Per Patient  Admit date: 04/13/2013 Discharge date: 04/19/2013  Time spent: 30 minutes  Recommendations for Outpatient Follow-up:  1. To be determined after completion of LTAC admission.  Discharge Diagnoses:    CAD (coronary artery disease)   Hidradenitis suppurativa with multiple groin and scrotal abscess s/p OR debridement   Schizophrenia   History of CVA (cerebrovascular accident)   Anemia   Gout   Malnutrition of moderate degree   Sepsis due to Proteus abscess groin wound   Hypotension due tom dehydration ans Sepsis   Dysphagia, pharyngoesophageal phase- mild Profound deconditioning   Discharge Condition: Stable  Diet recommendation: Dysphagia 3 with thin liquids  Filed Weights   04/16/13 0010 04/18/13 0005 04/19/13 0012  Weight: 55 kg (121 lb 4.1 oz) 51.5 kg (113 lb 8.6 oz) 51 kg (112 lb 7 oz)    History of present illness:  57 y.o. with schizophrenia, mild dementia, DM, CVA, CAD, Colon cancer s/p hemicolectomy, chronic hydradenitis. Recently discharged after admission to our facility for acute on chronic worsening of hidradenitis suppurativa of the groin area. Underwent I&D by surgery on 5/31 was placed on antibiotics and discharged to nursing home. Was found to have increasing wound drainage with leukocytosis and was sent to the ER. initially WAS mildly hypotensive with tachycardia. Upon evaluation he was found to have multiple groin wounds now extending to the scrotum, with small openings and discharge.   He was evaluated by [facility]CS and Urology. After multiple discussions with his sister and POA [name], he was taken to the OR and underwent scrotal expiration with excision of multiple scrotal necrotic areas. Operative findings were consistent with hidradenitis. Scrotal procedure performed by Dr.Tannenbaum and additional debridement of the perineal small multifocal abscesses was  performed by Dr. Donell Beers.   Post op 6/9 he was febrile and lethargic with bleeding from his wounds. He remained on broad spectrum anbx and received 2 units PRBC.   Palliative medicine was consulted for goals of care. After meeting with the patient and his sister on 6/9 both decided to continue with full scope of treatment.   Hospital Course:  Acute on chronic Hidradenitis suppuritiva of the groin area with multifocal abscesses with scrotal involvement. -underwent  extensivedebridement of groin/scrotum  6/8 -[facility]S and Urology followed as consultants this admission -blood cultures have been negative -wound cultures from OR demonstrated multi-drug resistant Proteus- Zosyn and Vancomycin were dc'd 6/12 in favor of Fortaz IV -Given poor prognosis and recurrent abscesses with hydradenitis and high likelihood of requiring multiple surgeries in future, d/w sister and agreeable for a Palliative consult for Goals of care, s/p meeting and want FULL SCOPE of treatment possible -requiring daily hydrotherapy treatments as part of TID wound care -base dressing is wet to dry.  Sepsis -criteria met at time of admission as evidenced but temp > 100.5, tachycardia, tachypnea, AMS, and hyperglycemia  -primary source was Proteus from groin abscsess -has resolved and patient is hemodynamically stable -IVF converted to saline lock 6/12  Schizophrenia  - continue Seroquel/Risperdone - cogentin resumed 6/12 -Sister is PoA, pt does not have capacity to make decisions  History of CVA with left-sided hemiplegia  - on antiplatelet agents prior to admit but given extensive wounds, concerns over bleeding and possible need for return trips to the OR will continue to hold Jonne Ply- will continue Plavix for now.  Chronic anemia  -due to post op blood loss and  chronic  disease  -transfused 3 units PRBC's since admission  Diabetes Mellitus with complications -continueSSI for now  History of gout  - continue  allopurinol.  Protein calorie malnutrition  -Nutrition consulted this admission -cont Ensure BID   Procedures: 1. Removal of condom catheter and placement of 16 French Foley catheter (5 cc balloon) and 2. Scrotal expiration with excision of multiple scrotal necrotic areas consistent with hidradenitis due to Hidradenitis suppurativa hands of the left hemiscrotum- found to have penile skin abrasion (from condom catheter) 6/8  Incision and drainage of chronic perineal/groin/scrotal hidradenitis with multifocal abscesses 6/8   Consultations: [facility] Urology Palliative medicine   Discharge Exam: Filed Vitals:   04/18/13 1949 04/19/13 0012 04/19/13 0404 04/19/13 0802  BP: 115/65 86/49 93/53  113/63  Pulse: 91 83 73 76  Temp: 98.6 F (37 C) 98 F (36.7 C) 97.8 F (36.6 C) 97.6 F (36.4 C)  TempSrc: Oral Oral Oral Oral  Resp: 23 13 12 14   Height:      Weight:  51 kg (112 lb 7 oz)    SpO2:  98% 100% 99%   General: No acute respiratory distress Lungs: Clear to auscultation bilaterally without wheezes or crackles, RA Cardiovascular: Regular rate and rhythm without murmur gallop or rub normal S1 and S2, no peripheral edema or JVD, IVF @ 125/hr Abdomen: Nontender, nondistended, soft, bowel sounds positive, no rebound, no ascites, no appreciable mass Genitourinary: Multiple dressings over groin- CDI- foley catheter in place with clear yellow urine to drainage bag Musculoskeletal: No significant cyanosis, clubbing of bilateral lower extremities Neurological: Awake but seems sedated-? This is baseline mental status due to underlying schizophrenia, oriented x name, moves all extremities x 4 without focal neurological deficits, CN 2-12 intact   Discharge Instructions      Discharge Orders   Future Orders Complete By Expires     Change dressing (specify)  As directed     Comments:      Dressing change: TID wet to dry---continue hydrotherapy per PT daily    Diet general  As directed      Scheduling Instructions:      Dysphagia 3 with thin liquids    Increase activity slowly  As directed         Medication List    STOP taking these medications       amoxicillin-clavulanate 500-125 MG per tablet  Commonly known as:  AUGMENTIN     aspirin EC 81 MG tablet     clindamycin 1 % external solution  Commonly known as:  CLEOCIN T     doxycycline 100 MG tablet  Commonly known as:  VIBRA-TABS      TAKE these medications       acetaminophen 325 MG tablet  Commonly known as:  TYLENOL  Take 2 tablets (650 mg total) by mouth every 6 (six) hours as needed.     acetaminophen 650 MG CR tablet  Commonly known as:  TYLENOL  Take 650 mg by mouth every 8 (eight) hours as needed for pain.     allopurinol 300 MG tablet  Commonly known as:  ZYLOPRIM  Take 300 mg by mouth daily.     atorvastatin 80 MG tablet  Commonly known as:  LIPITOR  Take 80 mg by mouth daily.     benztropine 1 MG tablet  Commonly known as:  COGENTIN  Take 1 mg by mouth daily.     camphor-menthol lotion  Commonly known as:  SARNA  Apply 1 application topically daily  as needed for itching.     clopidogrel 75 MG tablet  Commonly known as:  PLAVIX  Take 75 mg by mouth daily.     dextrose 5 % SOLN 50 mL with cefTAZidime 1 G SOLR 1 g  Inject 1 g into the vein every 8 (eight) hours.     Ensure Plus Liqd  Take 237 mLs by mouth 2 (two) times daily between meals.     ferrous sulfate 325 (65 FE) MG tablet  Take 325 mg by mouth 2 (two) times daily.     gabapentin 300 MG capsule  Commonly known as:  NEURONTIN  Take 600 mg by mouth at bedtime.     insulin aspart 100 UNIT/ML injection  Commonly known as:  novoLOG  Inject 0-15 Units into the skin 3 (three) times daily with meals.     magnesium oxide 400 MG tablet  Commonly known as:  MAG-OX  Take 400 mg by mouth 3 (three) times daily.     mirtazapine 15 MG tablet  Commonly known as:  REMERON  Take 15 mg by mouth at bedtime.     multivitamin with  minerals Tabs  Take 1 tablet by mouth daily.     ondansetron 4 MG/2ML Soln injection  Commonly known as:  ZOFRAN  Inject 2 mLs (4 mg total) into the vein every 6 (six) hours as needed for nausea.     polyethylene glycol packet  Commonly known as:  MIRALAX / GLYCOLAX  Take 17 g by mouth daily.     QUEtiapine 200 MG tablet  Commonly known as:  SEROQUEL  Take 200 mg by mouth at bedtime.     risperiDONE 3 MG tablet  Commonly known as:  RISPERDAL  Take 3 mg by mouth 2 (two) times daily.     triamcinolone cream 0.1 %  Commonly known as:  KENALOG  Apply 1 application topically 2 (two) times daily as needed (for dermatitis).       Allergies  Allergen Reactions  . Asa (Aspirin)     Takes baby enteric-coated ASA at home.      The results of significant diagnostics from this hospitalization (including imaging, microbiology, ancillary and laboratory) are listed below for reference.    Significant Diagnostic Studies: Ct Abdomen Pelvis W Contrast  03/31/2013   *RADIOLOGY REPORT*  Clinical Data: Rectal bleeding, history of colon cancer  CT ABDOMEN AND PELVIS WITH CONTRAST  Technique:  Multidetector CT imaging of the abdomen and pelvis was performed following the standard protocol during bolus administration of intravenous contrast.  Contrast: OMNIPAQUE IOHEXOL 300 MG/ML  SOLN  Comparison: No similar prior study is available for comparison.  Findings: Trace right pleural effusion noted.  Coronary arterial stent or calcification noted.  Lung bases are otherwise clear with the exception of minimal subpleural presumed atelectasis.  Gallstones noted without other CT evidence for acute cholecystitis. Liver, right kidney, adrenal glands, spleen, and pancreas are normal.  Too small to characterize sub centimeter left renal cortical hypodensities are noted, statistically most likely cysts. Moderate atheromatous aortic calcification without aneurysm.  There is diffuse skin thickening and subjacent  fat stranding involving the left buttock which is incompletely imaged.  There are areas of subcutaneous gas within this area of the, for example image 87.  No bowel wall thickening or focal segmental dilatation is identified.  Lumbar spine degenerative change is noted.  IMPRESSION: Diffuse skin thickening, subjacent fat stranding, and gas within the superficial soft tissue of the left buttock,  most compatible with decubitus ulcer versus abscess in the absence of trauma.   Original Report Authenticated By: Christiana Pellant, M.D.   Dg Chest Port 1 View  04/13/2013   *RADIOLOGY REPORT*  Clinical Data: Infiltrates.  PORTABLE CHEST - 1 VIEW  Comparison: 03/30/2013.  Findings: Trachea is midline.  Heart size normal.  Lungs are somewhat low in volume but clear. Previously suspected opacity in the left upper lobe is no longer visualized.  No pleural fluid.  IMPRESSION: Low lung volumes without acute finding.   Original Report Authenticated By: Leanna Battles, M.D.   Dg Chest Port 1 View  03/30/2013   *RADIOLOGY REPORT*  Clinical Data: Cough and weakness  PORTABLE CHEST - 1 VIEW  Comparison: None.  Findings: The patient is rotated slightly to the left.  Ill-defined left apical airspace opacity overlying the left upper lobe is noted.  Heart size is normal.  The right lung is grossly clear. Lung volumes are low with crowding of the bronchovascular markings. No pleural effusion.  No acute osseous abnormality.  Deformity of the right proximal humerus may indicate Hill-Sachs deformity.  The left proximal humerus is not well visualized but there is cortical irregularity which could indicate degenerative change or fracture but is not further evaluated.  IMPRESSION: Ill-defined left upper lobe airspace opacity which could be artifactual related to rotation and superimposed structures but further evaluation with dedicated PA and lateral chest radiograph is recommended for better visualization.  Cortical irregularity at the left  proximal humerus, question degenerative change or age indeterminate fracture.  Correlate for point tenderness and consider dedicated imaging of this area.   Original Report Authenticated By: Christiana Pellant, M.D.    Microbiology: Recent Results (from the past 240 hour(s))  MRSA PCR SCREENING     Status: None   Collection Time    04/13/13  3:29 AM      Result Value Range Status   MRSA by PCR NEGATIVE  NEGATIVE Final   Comment:            The GeneXpert MRSA Assay (FDA     approved for NASAL specimens     only), is one component of a     comprehensive MRSA colonization     surveillance program. It is not     intended to diagnose MRSA     infection nor to guide or     monitor treatment for     MRSA infections.  CULTURE, BLOOD (ROUTINE X 2)     Status: None   Collection Time    04/13/13  4:15 AM      Result Value Range Status   Specimen Description BLOOD RIGHT HAND   Final   Special Requests BOTTLES DRAWN AEROBIC ONLY 5CC   Final   Culture  Setup Time 04/13/2013 09:36   Final   Culture NO GROWTH 5 DAYS   Final   Report Status 04/19/2013 FINAL   Final  CULTURE, BLOOD (ROUTINE X 2)     Status: None   Collection Time    04/13/13  4:40 AM      Result Value Range Status   Specimen Description BLOOD RIGHT HAND   Final   Special Requests BOTTLES DRAWN AEROBIC ONLY 3CC   Final   Culture  Setup Time 04/13/2013 09:36   Final   Culture NO GROWTH 5 DAYS   Final   Report Status 04/19/2013 FINAL   Final  ANAEROBIC CULTURE     Status: None   Collection Time  04/15/13  8:00 AM      Result Value Range Status   Specimen Description ABSCESS LEFT GROIN   Final   Special Requests PATIENT ON FOLLOWING ZOSYN VANCOMYCIN   Final   Gram Stain     Final   Value: MODERATE WBC PRESENT, PREDOMINANTLY PMN     NO SQUAMOUS EPITHELIAL CELLS SEEN     NO ORGANISMS SEEN   Culture     Final   Value: NO ANAEROBES ISOLATED; CULTURE IN PROGRESS FOR 5 DAYS   Report Status PENDING   Incomplete  CULTURE,  ROUTINE-ABSCESS     Status: None   Collection Time    04/15/13  8:00 AM      Result Value Range Status   Specimen Description ABSCESS LEFT GROIN   Final   Special Requests PATIENT ON FOLLOWING ZOSYN VANCOMYCIN   Final   Gram Stain     Final   Value: MODERATE WBC PRESENT, PREDOMINANTLY PMN     NO SQUAMOUS EPITHELIAL CELLS SEEN     NO ORGANISMS SEEN   Culture FEW PROTEUS MIRABILIS   Final   Report Status 04/18/2013 FINAL   Final   Organism ID, Bacteria PROTEUS MIRABILIS   Final  CULTURE, BLOOD (ROUTINE X 2)     Status: None   Collection Time    04/16/13  8:00 AM      Result Value Range Status   Specimen Description BLOOD RIGHT FOREARM   Final   Special Requests BOTTLES DRAWN AEROBIC AND ANAEROBIC 7CC   Final   Culture  Setup Time 04/16/2013 14:09   Final   Culture     Final   Value:        BLOOD CULTURE RECEIVED NO GROWTH TO DATE CULTURE WILL BE HELD FOR 5 DAYS BEFORE ISSUING A FINAL NEGATIVE REPORT   Report Status PENDING   Incomplete  CULTURE, BLOOD (ROUTINE X 2)     Status: None   Collection Time    04/16/13  8:08 AM      Result Value Range Status   Specimen Description BLOOD RIGHT HAND   Final   Special Requests BOTTLES DRAWN AEROBIC ONLY [facility]   Final   Culture  Setup Time 04/16/2013 14:13   Final   Culture     Final   Value:        BLOOD CULTURE RECEIVED NO GROWTH TO DATE CULTURE WILL BE HELD FOR 5 DAYS BEFORE ISSUING A FINAL NEGATIVE REPORT   Report Status PENDING   Incomplete     Labs: Basic Metabolic Panel:  Recent Labs Lab 04/13/13 1615 04/14/13 0550 04/16/13 0808 04/17/13 0505 04/18/13 0240  NA 133* 136 134* 135 136  K 3.7 3.6 4.6 3.7 3.7  CL 96 98 98 103 103  CO2 30 30 27 27 28   GLUCOSE 240* 126* 145* 62* 196*  BUN 9 7 13 9 8   CREATININE 0.46* 0.62 0.79 0.66 0.56  CALCIUM 8.4 8.9 8.0* 8.3* 8.3*   Liver Function Tests:  Recent Labs Lab 04/13/13 0420  AST 44*  ALT 29  ALKPHOS 229*  BILITOT 0.4  PROT 10.1*  ALBUMIN 1.7*   No results found for  this basename: LIPASE, AMYLASE,  in the last 168 hours No results found for this basename: AMMONIA,  in the last 168 hours CBC:  Recent Labs Lab 04/13/13 0708  04/16/13 0950 04/16/13 2039 04/17/13 0505 04/18/13 0240 04/18/13 1634 04/19/13 0350  WBC 16.5*  < > 23.4*  --  17.4*  14.5* 11.2* 11.5*  NEUTROABS 14.3*  --   --   --   --   --   --   --   HGB 8.6*  < > 6.7* 8.6* 8.5* 7.6* 9.4* 9.0*  HCT 26.2*  < > 21.0* 25.5* 24.8* 22.9* 27.9* 27.4*  MCV 91.9  < > 93.8  --  89.9 91.2 91.5 92.6  PLT 453*  < > 387  --  325 334 322 342  < > = values in this interval not displayed. Cardiac Enzymes: No results found for this basename: CKTOTAL, CKMB, CKMBINDEX, TROPONINI,  in the last 168 hours BNP: BNP (last 3 results) No results found for this basename: PROBNP,  in the last 8760 hours CBG:  Recent Labs Lab 04/18/13 0805 04/18/13 1158 04/18/13 1616 04/18/13 2127 04/19/13 0745  GLUCAP 129* 225* 185* 172* 108*       Signed:  ELLIS,ALLISON L. ANP  [facility] 04/19/2013, 11:04 AM   I have examined the patient, reviewed the chart and modified the above note which I agree with.   Rafaelita Foister,MD [phone] 04/19/2013, 1:43 PM

## 2013-04-19 NOTE — Progress Notes (Signed)
Patient [id number] Damaso      DOB: 1955-12-17      YNW:[id number]  Late entry for 6/11  Patient sleeping comfortably.  Awaiting potential transfer to [facility].  Will shadow with you.  Keyonia Gluth L. Ladona Ridgel, MD MBA The Palliative Medicine Team at [facility] Phone: [phone] Pager: [phone]

## 2013-04-20 LAB — GLUCOSE, CAPILLARY
Glucose-Capillary: 123 mg/dL — ABNORMAL HIGH (ref 70–99)
Glucose-Capillary: 232 mg/dL — ABNORMAL HIGH (ref 70–99)

## 2013-04-20 LAB — CBC
Hemoglobin: 9.3 g/dL — ABNORMAL LOW (ref 13.0–17.0)
MCH: 31 pg (ref 26.0–34.0)
Platelets: 360 10*3/uL (ref 150–400)
RBC: 3 MIL/uL — ABNORMAL LOW (ref 4.22–5.81)
WBC: 11 10*3/uL — ABNORMAL HIGH (ref 4.0–10.5)

## 2013-04-20 LAB — ANAEROBIC CULTURE

## 2013-04-20 MED ORDER — GERHARDT'S BUTT CREAM
TOPICAL_CREAM | Freq: Two times a day (BID) | CUTANEOUS | Status: DC
  Filled 2013-04-20: qty 1

## 2013-04-20 MED ORDER — GERHARDT'S BUTT CREAM: 1.0000 "application " | TOPICAL_CREAM | Freq: Two times a day (BID) | CUTANEOUS | Status: AC

## 2013-04-20 MED ORDER — GELATIN ABSORBABLE 12-7 MM EX MISC
1.0000 | CUTANEOUS | Status: AC
  Administered 2013-04-20: 1 via TOPICAL
  Filled 2013-04-20: qty 1

## 2013-04-20 NOTE — Progress Notes (Signed)
Pt discharged to [facility]; Discharge instructions sent with paperwork that was given to [facility].

## 2013-04-20 NOTE — Progress Notes (Signed)
[facility] Progress Note [place] TEAM 1 - Stepdown/ICU TEAM   Khalin Royce JXB:[id number] DOB: [birth year] DOA: 04/13/2013 PCP: No PCP Per Patient  Brief narrative: 57 y.o. with schizophrenia, mild dementia, DM, CVA, CAD, Colon cancer s/p hemicolectomy, chronic hydradenitis. Recently discharged after admission to our facility for acute on chronic worsening of hidradenitis suppurativa of the groin area. Underwent I&D by surgery on 5/31 was placed on antibiotics and discharged to nursing home. Was found to have increasing wound drainage with leukocytosis and was sent to the ER. initially WAS mildly hypotensive with tachycardia. Upon evaluation he was found to have multiple groin wounds now extending to the scrotum, with small openings and discharge.   He was evaluated by CCS and Urology. After multiple discussions with his sister and POA [name], he was taken to the OR and underwent scrotal exploration with excision of multiple scrotal necrotic areas. Operative findings were consistent with hidradenitis. Scrotal procedure performed by Dr.Tannenbaum and additional debridement of the perineal small multifocal abscesses was performed by Dr. Donell Beers.   Post op 6/9 he was febrile and lethargic with bleeding from his wounds. He remained on broad spectrum anbx and received 2 units PRBC.   Palliative medicine was consulted for goals of care. After meeting with the patient and his sister on 6/9 both decided to continue with full scope of treatment.  Plan was to dc to LTAC 6/12 but delayed until approved by [facility] [place].  Assessment/Plan:  Acute on chronic Hidradenitis suppuritiva of the groin area with multifocal abscesses with scrotal involvement.  -underwent extensivedebridement of groin/scrotum 6/8  -CCS and Urology followed as consultants this admission  -blood cultures have been negative  -wound cultures from OR demonstrated multi-drug resistant Proteus- Zosyn and Vancomycin were dc'd  6/12 in favor of Fortaz IV  -Given poor prognosis and recurrent abscesses with hydradenitis and high likelihood of requiring multiple surgeries in future, d/w sister and agreeable for a Palliative consult for Goals of care, s/p meeting and want FULL SCOPE of treatment possible  -requiring daily hydrotherapy treatments as part of TID wound care -base dressing is wet to dry.   Sepsis  -criteria met at time of admission as evidenced but temp > 100.5, tachycardia, tachypnea with clear;y identified source of infection -primary source was Proteus from groin abscsess  -has resolved and patient is hemodynamically stable  -IVF converted to saline lock 6/12   Schizophrenia  - continue Seroquel/Risperdone  - cogentin resumed 6/12  -Sister is PoA, pt does not have capacity to make decisions   History of CVA with left-sided hemiplegia  - on antiplatelet agents prior to admit but given extensive wounds, concerns over bleeding and possible need for return trips to the OR will continue to hold Jonne Ply- will continue Plavix for now.   Chronic anemia  -due to post op blood loss and chronic disease  -transfused 3 units PRBC's since admission   Diabetes Mellitus with complications  -continueSSI for now   History of gout  - continue allopurinol.   Protein calorie malnutrition  -Nutrition consulted this admission  -cont Ensure BID  DVT prophylaxis: SCDs Code Status: DO NOT RESUSCITATE Family Communication: Patient only this morning Disposition Plan: Discharge to LTAC once approval obtained from [facility] Isolation: None Nutritional Status: See above re: established protein calorie malnutrition  Consultants: CCS  Urology  Palliative medicine   Procedures: 1. Removal of condom catheter and placement of 16 French Foley catheter (5 cc balloon) and 2. Scrotal expiration with excision of  multiple scrotal necrotic areas consistent with hidradenitis due to Hidradenitis suppurativa hands of the left  hemiscrotum- found to have penile skin abrasion (from condom catheter) 6/8   Incision and drainage of chronic perineal/groin/scrotal hidradenitis with multifocal abscesses 6/8   Antibiotics: Vanc 6/6 >>> 6/12 Zosyn 6/6 >>> 6/12 Elita Quick 6/12 >>>  HPI/Subjective: Patient more alert today. Minimally conversant but is making eye contact today. Noted poor oral intake. No specific complaints.  Answers "nope" to most questions.    Objective: Blood pressure 97/58, pulse 89, temperature 97.4 F (36.3 C), temperature source Oral, resp. rate 17, height 5\' 4"  (1.626 m), weight 51 kg (112 lb 7 oz), SpO2 99.00%.  Intake/Output Summary (Last 24 hours) at 04/20/13 1103 Last data filed at 04/20/13 0900  Gross per 24 hour  Intake 2828.75 ml  Output   3450 ml  Net -621.25 ml   Exam: General: No acute respiratory distress Lungs: Clear to auscultation bilaterally without wheezes or crackles, RA Cardiovascular: Regular rate and rhythm without murmur gallop or rub normal S1 and S2, no peripheral edema or JVD, IVF at [facility] Abdomen: Nontender, nondistended, soft, bowel sounds positive, no rebound, no ascites, no appreciable mass Genitourinary: Multiple dressings over groin- CDI- foley catheter in place with clear yellow urine to drainage bag Musculoskeletal: No significant cyanosis, clubbing of bilateral lower extremities Neurological: Awake and alert today, yet minimally conversant. This is baseline mental status due to underlying schizophrenia, oriented x name, moves all extremities x 4 without focal neurological deficits, CN 2-12 intact   Scheduled Meds: Scheduled Meds: . allopurinol  300 mg Oral Daily  . antiseptic oral rinse  15 mL Mouth Rinse BID  . atorvastatin  80 mg Oral Daily  . cefTAZidime (FORTAZ)  IV  1 g Intravenous Q8H  . clopidogrel  75 mg Oral Q breakfast  . feeding supplement  237 mL Oral TID WC  . ferrous sulfate  325 mg Oral BID  . gabapentin  600 mg Oral QHS  . insulin aspart   0-15 Units Subcutaneous TID WC  . magnesium oxide  400 mg Oral TID  . mirtazapine  15 mg Oral QHS  . multivitamin with minerals  1 tablet Oral Daily  . polyethylene glycol  17 g Oral Daily  . QUEtiapine  200 mg Oral QHS  . risperiDONE  3 mg Oral BID  . sodium chloride  3 mL Intravenous Q12H    Data Reviewed: Basic Metabolic Panel:  Recent Labs Lab 04/13/13 1615 04/14/13 0550 04/16/13 0808 04/17/13 0505 04/18/13 0240  NA 133* 136 134* 135 136  K 3.7 3.6 4.6 3.7 3.7  CL 96 98 98 103 103  CO2 30 30 27 27 28   GLUCOSE 240* 126* 145* 62* 196*  BUN 9 7 13 9 8   CREATININE 0.46* 0.62 0.79 0.66 0.56  CALCIUM 8.4 8.9 8.0* 8.3* 8.3*   CBC:  Recent Labs Lab 04/18/13 0240 04/18/13 1634 04/19/13 0350 04/19/13 1714 04/20/13 0345  WBC 14.5* 11.2* 11.5* 9.6 11.0*  HGB 7.6* 9.4* 9.0* 9.2* 9.3*  HCT 22.9* 27.9* 27.4* 27.7* 27.8*  MCV 91.2 91.5 92.6 92.0 92.7  PLT 334 322 342 355 360   CBG:  Recent Labs Lab 04/19/13 0745 04/19/13 1228 04/19/13 1555 04/19/13 1657 04/19/13 2133  GLUCAP 108* 190* 331* 307* 182*    Recent Results (from the past 240 hour(s))  MRSA PCR SCREENING     Status: None   Collection Time    04/13/13  3:29 AM  Result Value Range Status   MRSA by PCR NEGATIVE  NEGATIVE Final   Comment:            The GeneXpert MRSA Assay (FDA     approved for NASAL specimens     only), is one component of a     comprehensive MRSA colonization     surveillance program. It is not     intended to diagnose MRSA     infection nor to guide or     monitor treatment for     MRSA infections.  CULTURE, BLOOD (ROUTINE X 2)     Status: None   Collection Time    04/13/13  4:15 AM      Result Value Range Status   Specimen Description BLOOD RIGHT HAND   Final   Special Requests BOTTLES DRAWN AEROBIC ONLY 5CC   Final   Culture  Setup Time 04/13/2013 09:36   Final   Culture NO GROWTH 5 DAYS   Final   Report Status 04/19/2013 FINAL   Final  CULTURE, BLOOD (ROUTINE X 2)      Status: None   Collection Time    04/13/13  4:40 AM      Result Value Range Status   Specimen Description BLOOD RIGHT HAND   Final   Special Requests BOTTLES DRAWN AEROBIC ONLY 3CC   Final   Culture  Setup Time 04/13/2013 09:36   Final   Culture NO GROWTH 5 DAYS   Final   Report Status 04/19/2013 FINAL   Final  ANAEROBIC CULTURE     Status: None   Collection Time    04/15/13  8:00 AM      Result Value Range Status   Specimen Description ABSCESS LEFT GROIN   Final   Special Requests PATIENT ON FOLLOWING ZOSYN VANCOMYCIN   Final   Gram Stain     Final   Value: MODERATE WBC PRESENT, PREDOMINANTLY PMN     NO SQUAMOUS EPITHELIAL CELLS SEEN     NO ORGANISMS SEEN   Culture NO ANAEROBES ISOLATED   Final   Report Status 04/20/2013 FINAL   Final  CULTURE, ROUTINE-ABSCESS     Status: None   Collection Time    04/15/13  8:00 AM      Result Value Range Status   Specimen Description ABSCESS LEFT GROIN   Final   Special Requests PATIENT ON FOLLOWING ZOSYN VANCOMYCIN   Final   Gram Stain     Final   Value: MODERATE WBC PRESENT, PREDOMINANTLY PMN     NO SQUAMOUS EPITHELIAL CELLS SEEN     NO ORGANISMS SEEN   Culture FEW PROTEUS MIRABILIS   Final   Report Status 04/18/2013 FINAL   Final   Organism ID, Bacteria PROTEUS MIRABILIS   Final  CULTURE, BLOOD (ROUTINE X 2)     Status: None   Collection Time    04/16/13  8:00 AM      Result Value Range Status   Specimen Description BLOOD RIGHT FOREARM   Final   Special Requests BOTTLES DRAWN AEROBIC AND ANAEROBIC 7CC   Final   Culture  Setup Time 04/16/2013 14:09   Final   Culture     Final   Value:        BLOOD CULTURE RECEIVED NO GROWTH TO DATE CULTURE WILL BE HELD FOR 5 DAYS BEFORE ISSUING A FINAL NEGATIVE REPORT   Report Status PENDING   Incomplete  CULTURE, BLOOD (ROUTINE X 2)  Status: None   Collection Time    04/16/13  8:08 AM      Result Value Range Status   Specimen Description BLOOD RIGHT HAND   Final   Special Requests BOTTLES  DRAWN AEROBIC ONLY [facility]   Final   Culture  Setup Time 04/16/2013 14:13   Final   Culture     Final   Value:        BLOOD CULTURE RECEIVED NO GROWTH TO DATE CULTURE WILL BE HELD FOR 5 DAYS BEFORE ISSUING A FINAL NEGATIVE REPORT   Report Status PENDING   Incomplete     Studies:  Recent x-ray studies have been reviewed in detail by the Attending Physician  Scheduled Meds: Reviewed in detail by the Attending Physician   Junious Silk ANP [facility] Office  [phone] Pager [phone]  **If unable to reach the above provider after paging please contact the Flow Manager @ [phone]  On-Call/Text Page:      [email or web address]      password [email or web address]  If 7PM-7AM, please contact night-coverage [email or web address] Password [email or web address] 04/20/2013, 11:03 AM   LOS: 7 days   I have personally examined this patient and reviewed the entire database. I have reviewed the above note, made any necessary editorial changes, and agree with its content.  Lonia Blood, MD [facility]

## 2013-04-20 NOTE — Progress Notes (Signed)
Physical Therapy Wound Treatment Patient Details  Name: Xavier Khan MRN: [id number] Date of Birth: Oct 20, 1956  Today's Date: 04/20/2013 Time: 4098-1191 Time Calculation (min): 90 min  Subjective  Patient and Family Stated Goals: no goal stated--pt agreed we needed to get it clean Date of Onset:  (prior to admission) Prior Treatments: prior hydrotherapy and nursing managed wound care ( )  Pain Score:    Wound Assessment  Wound 04/01/13 Other (Comment) Multiple wound sites: Axilla, groin, Buttocks, thigh (Active)  Site / Wound Assessment Bleeding;Pink;Red 04/20/2013  1:32 PM  % Wound base Red or Granulating 100% 04/20/2013  1:32 PM  % Wound base Yellow 5% 04/18/2013 11:10 PM  % Wound base Black 5% 04/10/2013 11:31 AM  Peri-wound Assessment Erythema (blanchable) 04/20/2013  1:32 PM  Wound Length (cm) 7.3 cm 04/09/2013 11:29 AM  Wound Width (cm) 5 cm 04/09/2013 11:29 AM  Wound Depth (cm) 1.2 cm 04/09/2013 11:29 AM  Undermining (cm) 1 oclock 4.5 cm, 4-6 oclock 2.0 cm 04/09/2013 11:29 AM  Margins Unattacted edges (unapproximated) 04/20/2013  1:32 PM  Closure None 04/20/2013  1:32 PM  Drainage Amount Moderate 04/20/2013  1:32 PM  Drainage Description Purulent;Sanguineous 04/20/2013  1:32 PM  Non-staged Wound Description Not applicable 04/20/2013  1:32 PM  Treatment Cleansed 04/19/2013 11:00 PM  Dressing Type Abdominal binder;Gauze (Comment);Moist to dry 04/20/2013  1:32 PM  Dressing Changed Changed 04/20/2013  1:32 PM  Dressing Status Clean;Dry;Intact 04/20/2013  1:32 PM     Incision 04/07/13 Groin Left (Active)  Site / Wound Assessment Clean;Dry;Granulation tissue;Pink 04/20/2013  8:10 AM  Incision Length (cm) 2 cm 04/13/2013  3:55 AM  Margins Unattacted edges (unapproximated) 04/20/2013  8:10 AM  Closure None 04/20/2013  8:10 AM  Drainage Amount Minimal 04/20/2013  8:10 AM  Drainage Description Serosanguineous 04/20/2013  8:10 AM  Treatment Cleansed 04/19/2013  8:00 PM  Dressing Type Moist to dry;Gauze  (Comment) 04/20/2013  8:10 AM  Dressing Clean;Dry;Intact 04/20/2013  8:10 AM     Incision 04/07/13 Buttocks Left (Active)  Site / Wound Assessment Bleeding 04/20/2013  1:32 PM  Incision Length (cm) 10 cm 04/13/2013  3:55 AM  Margins Unattacted edges (unapproximated) 04/20/2013  1:32 PM  Closure None 04/20/2013  1:32 PM  Drainage Amount Copious 04/20/2013  1:32 PM  Drainage Description Sanguineous 04/20/2013  1:32 PM  Treatment Cleansed 04/19/2013  8:00 PM  Dressing Type ABD;Gauze (Comment) 04/20/2013  1:32 PM  Dressing Changed;Clean;Dry;Intact 04/20/2013  1:32 PM     Incision 04/15/13 Perineum (Active)  Site / Wound Assessment Bleeding 04/20/2013  1:32 PM  Margins Unattacted edges (unapproximated) 04/20/2013  1:32 PM  Closure None 04/20/2013  1:32 PM  Drainage Amount Moderate 04/20/2013  1:32 PM  Drainage Description Sanguineous;Purulent 04/20/2013  1:32 PM  Treatment Cleansed 04/19/2013  8:00 PM  Dressing Type ABD;Gauze (Comment) 04/20/2013  1:32 PM  Dressing Changed 04/20/2013  1:32 PM     Incision 04/15/13 Scrotum (Active)  Site / Wound Assessment Bleeding 04/20/2013  1:32 PM  Margins Unattacted edges (unapproximated) 04/20/2013  1:32 PM  Closure None 04/20/2013  1:32 PM  Drainage Amount Copious 04/20/2013  1:32 PM  Drainage Description Sanguineous 04/20/2013  1:32 PM  Treatment Cleansed 04/20/2013  1:32 PM  Dressing Type Gauze (Comment);ABD 04/20/2013  1:32 PM  Dressing Changed 04/20/2013  1:32 PM     Incision 04/15/13 Perineum (Active)  Site / Wound Assessment Bleeding 04/20/2013  1:32 PM  Margins Unattacted edges (unapproximated) 04/20/2013  1:32 PM  Closure None 04/20/2013  1:32 PM  Drainage Amount Moderate 04/20/2013  1:32 PM  Drainage Description Serosanguineous;Purulent 04/20/2013  1:32 PM  Treatment Cleansed 04/20/2013  1:32 PM  Dressing Type ABD;Gauze (Comment) 04/20/2013  1:32 PM  Dressing Changed 04/20/2013  1:32 PM   Hydrotherapy Pulsed lavage therapy - wound location: l hip, L and R  perineum, scrotum Pulsed Lavage with Suction (psi): 4 psi Pulsed Lavage with Suction - Normal Saline Used: 2000 mL Pulsed Lavage Tip: Tip with splash shield Selective Debridement Selective Debridement - Location: scrotum Selective Debridement - Tools Used: Forceps;Scissors Selective Debridement - Tissue Removed: yellow eschar   Wound Assessment and Plan  Wound Therapy - Assess/Plan/Recommendations Wound Therapy - Clinical Statement: PLS to cleanse all wounds, selective debridement of left over yellow necrosis and preparation for healing. Wound Therapy - Functional Problem List: immobility Factors Delaying/Impairing Wound Healing: Immobility;Multiple medical problems Hydrotherapy Plan: Debridement;Patient/family education;Pulsatile lavage with suction Wound Therapy - Frequency: 6X / week Wound Therapy - Current Recommendations: Other (comment) (SNF RN for wound care) Wound Therapy - Follow Up Recommendations: Skilled nursing facility Wound Plan: promote healthy wound with PLS to cleanse and selective debridement to prep. for healing,.  Wound Therapy Goals- Improve the function of patient's integumentary system by progressing the wound(s) through the phases of wound healing (inflammation - proliferation - remodeling) by: Decrease Necrotic Tissue to: 0% Decrease Necrotic Tissue - Progress: Progressing toward goal Increase Granulation Tissue to: 100% Increase Granulation Tissue - Progress: Progressing toward goal Improve Drainage Characteristics: Min;Serous Improve Drainage Characteristics - Progress: Progressing toward goal Time For Goal Achievement: 2 weeks Wound Therapy - Potential for Goals: Good  Goals will be updated until maximal potential achieved or discharge criteria met.  Discharge criteria: when goals achieved, discharge from hospital, MD decision/surgical intervention, no progress towards goals, refusal/missing three consecutive treatments without notification or medical  reason.  GP     Avontae Burkhead, Eliseo Gum 04/20/2013, 1:35 PM 04/20/2013  Basile Bing, PT [phone]  (pager)

## 2013-04-20 NOTE — Progress Notes (Signed)
I have seen and examined the patient and agree with the assessment and plans. Continuing local wound care  Narciso Stoutenburg A. Magnus Ivan  MD, FACS

## 2013-04-20 NOTE — Progress Notes (Signed)
Clinical Social Worker staffed case with RNCM; plan is for pt to dc [facility].   CSW to sign off at this time, please re consult if needed.    Angelia Mould, MSW, [place] [phone]

## 2013-04-20 NOTE — Progress Notes (Signed)
5 Days Post-Op  Subjective: No complaints, sister is in the room with him for dressing change/hydro Rx.  Objective: Vital signs in last 24 hours: Temp:  [97.6 F (36.4 C)-98.3 F (36.8 C)] 97.9 F (36.6 C) (06/13 0433) Pulse Rate:  [74-93] 93 (06/12 2018) Resp:  [14-25] 14 (06/13 0433) BP: (78-120)/(51-69) 78/51 mmHg (06/13 0433) SpO2:  [98 %-99 %] 98 % (06/12 2018) Weight:  [51 kg (112 lb 7 oz)] 51 kg (112 lb 7 oz) (06/13 0433) Last BM Date: 04/19/13 Afebrile,VSS WBC is up some, H/H is stable. Intake/Output from previous day: 06/12 0701 - 06/13 0700 In: 2961.8 [P.O.:240; I.V.:2721.8] Out: 3600 [Urine:3600] Intake/Output this shift:    General appearance: cooperative and awake, he acknowledged [place] once while talking with him. Skin: Right Groin, wick in place, still oozing some purulent material.          Left groin Open and clean with good pink granular base          Buttocks, open sites below scrotum look good, left side looks good.           Scrotum:  Clean and granular base. He developed a bleeder from a site in the open scrotum and we had to use silver nitrate and gelfoam to control it.  Plan to use xeroform gauze on scrotum today.           Buttocks skin:  He has some wearing and moisture to his skin, where he lies most of the time.  His lower thigh has some areas where there is low grade early changes, some look like they could become purulent, but not quite there. Lab Results:   Recent Labs  04/19/13 1714 04/20/13 0345  WBC 9.6 11.0*  HGB 9.2* 9.3*  HCT 27.7* 27.8*  PLT 355 360    BMET  Recent Labs  04/18/13 0240  NA 136  K 3.7  CL 103  CO2 28  GLUCOSE 196*  BUN 8  CREATININE 0.56  CALCIUM 8.3*   PT/INR No results found for this basename: LABPROT, INR,  in the last 72 hours  No results found for this basename: AST, ALT, ALKPHOS, BILITOT, PROT, ALBUMIN,  in the last 168 hours   Lipase  No results found for this basename: lipase      Studies/Results: No results found.  Medications: . allopurinol  300 mg Oral Daily  . antiseptic oral rinse  15 mL Mouth Rinse BID  . atorvastatin  80 mg Oral Daily  . cefTAZidime (FORTAZ)  IV  1 g Intravenous Q8H  . clopidogrel  75 mg Oral Q breakfast  . feeding supplement  237 mL Oral TID WC  . ferrous sulfate  325 mg Oral BID  . gabapentin  600 mg Oral QHS  . insulin aspart  0-15 Units Subcutaneous TID WC  . magnesium oxide  400 mg Oral TID  . mirtazapine  15 mg Oral QHS  . multivitamin with minerals  1 tablet Oral Daily  . polyethylene glycol  17 g Oral Daily  . QUEtiapine  200 mg Oral QHS  . risperiDONE  3 mg Oral BID  . sodium chloride  3 mL Intravenous Q12H    Assessment/Plan Recurrent hidradenitis, Removal of condom catheter and placement of 16 French Foley catheter (5 cc balloon)  Scrotal expiration with excision of multiple scrotal necrotic areas consistent with hidradenitis. Further I/d of bilateral perineal abscess. Dr. Donell Beers and Tannenbaum6/8/14  IRRIGATION AND NON-EXCISIONAL DEBRIDEMENT ABSCESS left groin abscess, 04/07/2013,  Cherylynn Ridges, MD.  Fever up to 101 now.  Schizophrenia - Resides with his family  History of CVA with left-sided hemiplegia - on antiplatelet agents.  History of colon cancer status post hemicolectomy  AODM  History of gout  Chronic anemia  Code status: DNR (Palliative consult with plans for transfer to LTAC.)   Plan:  Continue hydro Rx, keep skin clean and dry, add barrier cream to buttocks to help avoid breakdown.   LOS: 7 days    Layliana Devins 04/20/2013

## 2013-04-22 LAB — CULTURE, BLOOD (ROUTINE X 2): Culture: NO GROWTH

## 2013-04-30 NOTE — Progress Notes (Signed)
Agree with NP note. ST

## 2013-06-01 ENCOUNTER — Other Ambulatory Visit: Payer: Self-pay | Admitting: Family Medicine

## 2013-06-08 ENCOUNTER — Other Ambulatory Visit: Payer: Self-pay | Admitting: Family Medicine

## 2013-06-22 ENCOUNTER — Other Ambulatory Visit: Payer: Self-pay | Admitting: Family Medicine

## 2013-08-21 ENCOUNTER — Encounter (INDEPENDENT_AMBULATORY_CARE_PROVIDER_SITE_OTHER): Payer: Self-pay

## 2013-08-21 ENCOUNTER — Ambulatory Visit (INDEPENDENT_AMBULATORY_CARE_PROVIDER_SITE_OTHER): Payer: Medicare (Managed Care) | Admitting: General Surgery

## 2013-08-21 ENCOUNTER — Encounter (INDEPENDENT_AMBULATORY_CARE_PROVIDER_SITE_OTHER): Payer: Self-pay | Admitting: General Surgery

## 2013-08-21 VITALS — BP 98/58 | HR 68 | Temp 98.6°F | Resp 14 | Ht 64.0 in | Wt 117.2 lb

## 2013-08-21 DIAGNOSIS — L732 Hidradenitis suppurativa: Secondary | ICD-10-CM

## 2013-08-21 DIAGNOSIS — E119 Type 2 diabetes mellitus without complications: Secondary | ICD-10-CM

## 2013-08-21 MED ORDER — SULFAMETHOXAZOLE-TRIMETHOPRIM 400-80 MG PO TABS: 1.0000 | ORAL_TABLET | Freq: Two times a day (BID) | ORAL | Status: AC

## 2013-08-21 NOTE — Progress Notes (Signed)
The patient is a schizophrenic patient was seen in the hospital on the DOW service with significant perianal, bilateral inguinal, bilateral axillary hidradenitis of a significant portion. He comes in today with significant disease however by report of his sister patient continues to contaminate all areas by picking at it and scratching it on a continuous basis. He's been placed on chronic doxycycline therapy by his primary care doctor.  On examination today the patient has draining hidradenitis in both axilla, in the perianal area, and especially in the left groin area where there is significant drainage of pus and likely fluctuant areas that could be drained if this would not open up Pandora's box.  The patient is not an operative candidate.Because of his schizophrenia and his inability to care for his wounds there is not much we can offer him surgically. He is not acutely ill and does not require immediate drainage. If he should become stable mentally and perhaps we can consider wide excision of this groin area with possible skin grafting in the future however at this point I do not think that would be an option.  I will prescribed the patient's Septra. He needs to be followed by his primary care physician.

## 2013-09-06 ENCOUNTER — Emergency Department: Payer: Self-pay | Admitting: Emergency Medicine

## 2013-09-17 ENCOUNTER — Emergency Department: Payer: Self-pay | Admitting: Emergency Medicine

## 2013-10-02 ENCOUNTER — Encounter: Payer: Self-pay | Admitting: Surgery

## 2013-10-02 ENCOUNTER — Encounter: Payer: Self-pay | Admitting: Cardiothoracic Surgery

## 2013-10-08 ENCOUNTER — Encounter: Payer: Self-pay | Admitting: Cardiothoracic Surgery

## 2013-10-08 ENCOUNTER — Encounter: Payer: Self-pay | Admitting: Surgery

## 2013-10-09 ENCOUNTER — Ambulatory Visit: Payer: Self-pay | Admitting: Oncology

## 2013-11-08 ENCOUNTER — Encounter: Payer: Self-pay | Admitting: Surgery

## 2013-11-08 ENCOUNTER — Encounter: Payer: Self-pay | Admitting: Cardiothoracic Surgery

## 2013-11-08 ENCOUNTER — Ambulatory Visit: Payer: Self-pay | Admitting: Oncology

## 2013-12-25 NOTE — Telephone Encounter (Signed)
No visit

## 2014-01-09 ENCOUNTER — Observation Stay: Payer: Self-pay | Admitting: Internal Medicine

## 2014-01-22 ENCOUNTER — Ambulatory Visit: Payer: Self-pay | Admitting: Oncology

## 2014-02-06 ENCOUNTER — Ambulatory Visit: Payer: Self-pay | Admitting: Oncology

## 2014-02-11 ENCOUNTER — Encounter: Payer: Self-pay | Admitting: Surgery

## 2014-03-08 ENCOUNTER — Encounter: Payer: Self-pay | Admitting: Surgery

## 2014-03-11 ENCOUNTER — Encounter: Payer: Self-pay | Admitting: Surgery

## 2014-03-18 ENCOUNTER — Ambulatory Visit: Payer: Self-pay | Admitting: Oncology

## 2014-03-19 ENCOUNTER — Ambulatory Visit: Payer: Self-pay | Admitting: Oncology

## 2014-04-03 ENCOUNTER — Inpatient Hospital Stay: Payer: Self-pay | Admitting: Internal Medicine

## 2014-04-08 ENCOUNTER — Ambulatory Visit: Payer: Self-pay | Admitting: Oncology

## 2014-04-17 ENCOUNTER — Ambulatory Visit: Payer: Self-pay | Admitting: Oncology

## 2014-05-08 ENCOUNTER — Ambulatory Visit: Payer: Self-pay | Admitting: Oncology

## 2014-05-21 ENCOUNTER — Ambulatory Visit: Payer: Self-pay | Admitting: Oncology

## 2014-06-03 ENCOUNTER — Inpatient Hospital Stay: Payer: Self-pay | Admitting: Internal Medicine

## 2014-07-13 ENCOUNTER — Emergency Department: Payer: Self-pay | Admitting: Internal Medicine

## 2014-11-08 ENCOUNTER — Ambulatory Visit: Payer: Self-pay | Admitting: Internal Medicine

## 2014-11-13 ENCOUNTER — Inpatient Hospital Stay: Payer: Self-pay | Admitting: Internal Medicine

## 2014-11-22 ENCOUNTER — Inpatient Hospital Stay
Admission: AD | Admit: 2014-11-22 | Discharge: 2014-12-19 | Disposition: A | Discharge status: Skilled Nursing Facility | Payer: Self-pay | Source: Ambulatory Visit | Attending: Internal Medicine | Admitting: Internal Medicine

## 2014-11-22 ENCOUNTER — Other Ambulatory Visit (HOSPITAL_COMMUNITY): Payer: Self-pay

## 2014-11-22 DIAGNOSIS — J189 Pneumonia, unspecified organism: Secondary | ICD-10-CM

## 2014-11-22 DIAGNOSIS — Z931 Gastrostomy status: Secondary | ICD-10-CM

## 2014-11-22 DIAGNOSIS — Z95828 Presence of other vascular implants and grafts: Secondary | ICD-10-CM

## 2014-11-22 DIAGNOSIS — Z789 Other specified health status: Secondary | ICD-10-CM

## 2014-11-22 DIAGNOSIS — E46 Unspecified protein-calorie malnutrition: Secondary | ICD-10-CM

## 2014-11-23 LAB — CBC WITH DIFFERENTIAL/PLATELET
BASOS ABS: 0 10*3/uL (ref 0.0–0.1)
BASOS PCT: 0 % (ref 0–1)
EOS ABS: 0.3 10*3/uL (ref 0.0–0.7)
EOS PCT: 3 % (ref 0–5)
HCT: 25.8 % — ABNORMAL LOW (ref 39.0–52.0)
Hemoglobin: 8.2 g/dL — ABNORMAL LOW (ref 13.0–17.0)
LYMPHS ABS: 0.9 10*3/uL (ref 0.7–4.0)
Lymphocytes Relative: 8 % — ABNORMAL LOW (ref 12–46)
MCH: 30.6 pg (ref 26.0–34.0)
MCHC: 31.8 g/dL (ref 30.0–36.0)
MCV: 96.3 fL (ref 78.0–100.0)
Monocytes Absolute: 0.7 10*3/uL (ref 0.1–1.0)
Monocytes Relative: 6 % (ref 3–12)
NEUTROS ABS: 9.5 10*3/uL — AB (ref 1.7–7.7)
NEUTROS PCT: 83 % — AB (ref 43–77)
Platelets: 449 10*3/uL — ABNORMAL HIGH (ref 150–400)
RBC: 2.68 MIL/uL — ABNORMAL LOW (ref 4.22–5.81)
RDW: 18 % — ABNORMAL HIGH (ref 11.5–15.5)
WBC: 11.4 10*3/uL — ABNORMAL HIGH (ref 4.0–10.5)

## 2014-11-23 LAB — SEDIMENTATION RATE: Sed Rate: 140 mm/hr — ABNORMAL HIGH (ref 0–16)

## 2014-11-23 LAB — HEMOGLOBIN A1C
Hgb A1c MFr Bld: 4.8 % (ref <5.7)
MEAN PLASMA GLUCOSE: 91 mg/dL (ref <117)

## 2014-11-23 LAB — COMPREHENSIVE METABOLIC PANEL
ALT: 6 U/L (ref 0–53)
AST: 16 U/L (ref 0–37)
Albumin: 1.4 g/dL — ABNORMAL LOW (ref 3.5–5.2)
Alkaline Phosphatase: 117 U/L (ref 39–117)
Anion gap: 10 (ref 5–15)
BUN: 22 mg/dL (ref 6–23)
CHLORIDE: 101 meq/L (ref 96–112)
CO2: 29 mmol/L (ref 19–32)
Calcium: 8.4 mg/dL (ref 8.4–10.5)
Creatinine, Ser: 1.14 mg/dL (ref 0.50–1.35)
GFR calc Af Amer: 79 mL/min — ABNORMAL LOW (ref >90)
GFR calc non Af Amer: 68 mL/min — ABNORMAL LOW (ref >90)
Glucose, Bld: 125 mg/dL — ABNORMAL HIGH (ref 70–99)
Potassium: 3.5 mmol/L (ref 3.5–5.1)
SODIUM: 140 mmol/L (ref 135–145)
TOTAL PROTEIN: 8.7 g/dL — AB (ref 6.0–8.3)
Total Bilirubin: 1.3 mg/dL — ABNORMAL HIGH (ref 0.3–1.2)

## 2014-11-23 LAB — C-REACTIVE PROTEIN: CRP: 10.4 mg/dL — ABNORMAL HIGH (ref <0.60)

## 2014-11-23 LAB — MAGNESIUM: Magnesium: 1.7 mg/dL (ref 1.5–2.5)

## 2014-11-23 LAB — IRON: IRON: 27 ug/dL — AB (ref 42–165)

## 2014-11-23 LAB — PHOSPHORUS: Phosphorus: 4.1 mg/dL (ref 2.3–4.6)

## 2014-11-23 LAB — TSH: TSH: 1.425 u[IU]/mL (ref 0.350–4.500)

## 2014-11-23 LAB — PROCALCITONIN: Procalcitonin: 0.77 ng/mL

## 2014-11-23 LAB — PROTIME-INR
INR: 1.19 (ref 0.00–1.49)
PROTHROMBIN TIME: 15.3 s — AB (ref 11.6–15.2)

## 2014-11-24 LAB — VITAMIN B12: VITAMIN B 12: 780 pg/mL (ref 211–911)

## 2014-11-24 LAB — FERRITIN: Ferritin: 1434 ng/mL — ABNORMAL HIGH (ref 22–322)

## 2014-11-24 LAB — T4, FREE: FREE T4: 1.46 ng/dL (ref 0.80–1.80)

## 2014-11-25 LAB — FOLATE RBC
FOLATE, RBC: 1829 ng/mL (ref >498)
Folate, Hemolysate: 479.3 ng/mL
Hematocrit: 26.2 % — ABNORMAL LOW (ref 37.5–51.0)

## 2014-11-27 ENCOUNTER — Other Ambulatory Visit (HOSPITAL_COMMUNITY): Payer: Self-pay

## 2014-11-27 LAB — CBC WITH DIFFERENTIAL/PLATELET
BASOS PCT: 0 % (ref 0–1)
Basophils Absolute: 0 10*3/uL (ref 0.0–0.1)
Eosinophils Absolute: 0.1 10*3/uL (ref 0.0–0.7)
Eosinophils Relative: 1 % (ref 0–5)
HEMATOCRIT: 25.6 % — AB (ref 39.0–52.0)
Hemoglobin: 8 g/dL — ABNORMAL LOW (ref 13.0–17.0)
LYMPHS ABS: 1.1 10*3/uL (ref 0.7–4.0)
Lymphocytes Relative: 5 % — ABNORMAL LOW (ref 12–46)
MCH: 30.8 pg (ref 26.0–34.0)
MCHC: 31.3 g/dL (ref 30.0–36.0)
MCV: 98.5 fL (ref 78.0–100.0)
MONO ABS: 0.9 10*3/uL (ref 0.1–1.0)
MONOS PCT: 4 % (ref 3–12)
Neutro Abs: 19.7 10*3/uL — ABNORMAL HIGH (ref 1.7–7.7)
Neutrophils Relative %: 90 % — ABNORMAL HIGH (ref 43–77)
Platelets: 566 10*3/uL — ABNORMAL HIGH (ref 150–400)
RBC: 2.6 MIL/uL — ABNORMAL LOW (ref 4.22–5.81)
RDW: 18 % — ABNORMAL HIGH (ref 11.5–15.5)
WBC: 21.8 10*3/uL — AB (ref 4.0–10.5)

## 2014-11-27 LAB — BASIC METABOLIC PANEL
ANION GAP: 6 (ref 5–15)
BUN: 19 mg/dL (ref 6–23)
CHLORIDE: 102 meq/L (ref 96–112)
CO2: 32 mmol/L (ref 19–32)
Calcium: 8.2 mg/dL — ABNORMAL LOW (ref 8.4–10.5)
Creatinine, Ser: 1.28 mg/dL (ref 0.50–1.35)
GFR calc non Af Amer: 59 mL/min — ABNORMAL LOW (ref >90)
GFR, EST AFRICAN AMERICAN: 69 mL/min — AB (ref >90)
Glucose, Bld: 151 mg/dL — ABNORMAL HIGH (ref 70–99)
Potassium: 4 mmol/L (ref 3.5–5.1)
Sodium: 140 mmol/L (ref 135–145)

## 2014-11-27 LAB — PHOSPHORUS: PHOSPHORUS: 3.9 mg/dL (ref 2.3–4.6)

## 2014-11-27 LAB — BRAIN NATRIURETIC PEPTIDE: B NATRIURETIC PEPTIDE 5: 68.7 pg/mL (ref 0.0–100.0)

## 2014-11-27 LAB — MAGNESIUM: MAGNESIUM: 2.3 mg/dL (ref 1.5–2.5)

## 2014-11-28 LAB — BASIC METABOLIC PANEL
Anion gap: 10 (ref 5–15)
BUN: 21 mg/dL (ref 6–23)
CALCIUM: 8.1 mg/dL — AB (ref 8.4–10.5)
CHLORIDE: 100 meq/L (ref 96–112)
CO2: 25 mmol/L (ref 19–32)
CREATININE: 1.17 mg/dL (ref 0.50–1.35)
GFR calc non Af Amer: 66 mL/min — ABNORMAL LOW (ref >90)
GFR, EST AFRICAN AMERICAN: 77 mL/min — AB (ref >90)
GLUCOSE: 120 mg/dL — AB (ref 70–99)
Potassium: 6.4 mmol/L (ref 3.5–5.1)
Sodium: 135 mmol/L (ref 135–145)

## 2014-11-28 LAB — CBC
HCT: 26.4 % — ABNORMAL LOW (ref 39.0–52.0)
Hemoglobin: 8.4 g/dL — ABNORMAL LOW (ref 13.0–17.0)
MCH: 32.1 pg (ref 26.0–34.0)
MCHC: 31.8 g/dL (ref 30.0–36.0)
MCV: 100.8 fL — AB (ref 78.0–100.0)
Platelets: 443 10*3/uL — ABNORMAL HIGH (ref 150–400)
RBC: 2.62 MIL/uL — ABNORMAL LOW (ref 4.22–5.81)
RDW: 17.5 % — ABNORMAL HIGH (ref 11.5–15.5)
WBC: 16.9 10*3/uL — ABNORMAL HIGH (ref 4.0–10.5)

## 2014-11-29 LAB — URINALYSIS, ROUTINE W REFLEX MICROSCOPIC
Bilirubin Urine: NEGATIVE
GLUCOSE, UA: NEGATIVE mg/dL
HGB URINE DIPSTICK: NEGATIVE
Ketones, ur: NEGATIVE mg/dL
Nitrite: NEGATIVE
PROTEIN: 30 mg/dL — AB
Specific Gravity, Urine: 1.011 (ref 1.005–1.030)
Urobilinogen, UA: 1 mg/dL (ref 0.0–1.0)
pH: 8 (ref 5.0–8.0)

## 2014-11-29 LAB — URINE MICROSCOPIC-ADD ON

## 2014-11-29 LAB — CBC WITH DIFFERENTIAL/PLATELET
Basophils Absolute: 0 10*3/uL (ref 0.0–0.1)
Basophils Relative: 0 % (ref 0–1)
EOS ABS: 0.2 10*3/uL (ref 0.0–0.7)
Eosinophils Relative: 1 % (ref 0–5)
HEMATOCRIT: 26.6 % — AB (ref 39.0–52.0)
HEMOGLOBIN: 8.2 g/dL — AB (ref 13.0–17.0)
LYMPHS ABS: 1.3 10*3/uL (ref 0.7–4.0)
Lymphocytes Relative: 10 % — ABNORMAL LOW (ref 12–46)
MCH: 30.8 pg (ref 26.0–34.0)
MCHC: 30.8 g/dL (ref 30.0–36.0)
MCV: 100 fL (ref 78.0–100.0)
Monocytes Absolute: 1.1 10*3/uL — ABNORMAL HIGH (ref 0.1–1.0)
Monocytes Relative: 8 % (ref 3–12)
NEUTROS ABS: 10.3 10*3/uL — AB (ref 1.7–7.7)
Neutrophils Relative %: 80 % — ABNORMAL HIGH (ref 43–77)
Platelets: 587 10*3/uL — ABNORMAL HIGH (ref 150–400)
RBC: 2.66 MIL/uL — ABNORMAL LOW (ref 4.22–5.81)
RDW: 16.9 % — ABNORMAL HIGH (ref 11.5–15.5)
WBC: 12.8 10*3/uL — AB (ref 4.0–10.5)

## 2014-11-29 LAB — BASIC METABOLIC PANEL
ANION GAP: 9 (ref 5–15)
BUN: 19 mg/dL (ref 6–23)
CO2: 28 mmol/L (ref 19–32)
Calcium: 8.2 mg/dL — ABNORMAL LOW (ref 8.4–10.5)
Chloride: 103 mEq/L (ref 96–112)
Creatinine, Ser: 1.12 mg/dL (ref 0.50–1.35)
GFR, EST AFRICAN AMERICAN: 81 mL/min — AB (ref >90)
GFR, EST NON AFRICAN AMERICAN: 70 mL/min — AB (ref >90)
Glucose, Bld: 113 mg/dL — ABNORMAL HIGH (ref 70–99)
Potassium: 3.7 mmol/L (ref 3.5–5.1)
Sodium: 140 mmol/L (ref 135–145)

## 2014-11-30 LAB — URINE CULTURE
CULTURE: NO GROWTH
Colony Count: NO GROWTH
SPECIAL REQUESTS: NORMAL

## 2014-11-30 LAB — BASIC METABOLIC PANEL
Anion gap: 14 (ref 5–15)
BUN: 12 mg/dL (ref 6–23)
CALCIUM: 8 mg/dL — AB (ref 8.4–10.5)
CHLORIDE: 99 mmol/L (ref 96–112)
CO2: 24 mmol/L (ref 19–32)
CREATININE: 1.07 mg/dL (ref 0.50–1.35)
GFR calc Af Amer: 85 mL/min — ABNORMAL LOW (ref >90)
GFR calc non Af Amer: 74 mL/min — ABNORMAL LOW (ref >90)
Glucose, Bld: 64 mg/dL — ABNORMAL LOW (ref 70–99)
Potassium: 4.3 mmol/L (ref 3.5–5.1)
Sodium: 137 mmol/L (ref 135–145)

## 2014-12-01 LAB — CBC WITH DIFFERENTIAL/PLATELET
BASOS ABS: 0.1 10*3/uL (ref 0.0–0.1)
Basophils Relative: 0 % (ref 0–1)
EOS PCT: 0 % (ref 0–5)
Eosinophils Absolute: 0 10*3/uL (ref 0.0–0.7)
HEMATOCRIT: 26.4 % — AB (ref 39.0–52.0)
Hemoglobin: 8.2 g/dL — ABNORMAL LOW (ref 13.0–17.0)
Lymphocytes Relative: 7 % — ABNORMAL LOW (ref 12–46)
Lymphs Abs: 1.4 10*3/uL (ref 0.7–4.0)
MCH: 30.5 pg (ref 26.0–34.0)
MCHC: 31.1 g/dL (ref 30.0–36.0)
MCV: 98.1 fL (ref 78.0–100.0)
MONO ABS: 1.1 10*3/uL — AB (ref 0.1–1.0)
Monocytes Relative: 5 % (ref 3–12)
NEUTROS PCT: 88 % — AB (ref 43–77)
Neutro Abs: 17.7 10*3/uL — ABNORMAL HIGH (ref 1.7–7.7)
Platelets: 535 10*3/uL — ABNORMAL HIGH (ref 150–400)
RBC: 2.69 MIL/uL — ABNORMAL LOW (ref 4.22–5.81)
RDW: 16.9 % — AB (ref 11.5–15.5)
WBC: 20.3 10*3/uL — ABNORMAL HIGH (ref 4.0–10.5)

## 2014-12-01 LAB — C-REACTIVE PROTEIN: CRP: 15.4 mg/dL — ABNORMAL HIGH (ref <0.60)

## 2014-12-01 LAB — BASIC METABOLIC PANEL
Anion gap: 12 (ref 5–15)
BUN: 13 mg/dL (ref 6–23)
CALCIUM: 8.2 mg/dL — AB (ref 8.4–10.5)
CO2: 23 mmol/L (ref 19–32)
Chloride: 101 mmol/L (ref 96–112)
Creatinine, Ser: 1.23 mg/dL (ref 0.50–1.35)
GFR calc Af Amer: 72 mL/min — ABNORMAL LOW (ref >90)
GFR calc non Af Amer: 62 mL/min — ABNORMAL LOW (ref >90)
Glucose, Bld: 112 mg/dL — ABNORMAL HIGH (ref 70–99)
POTASSIUM: 3.5 mmol/L (ref 3.5–5.1)
SODIUM: 136 mmol/L (ref 135–145)

## 2014-12-01 LAB — SEDIMENTATION RATE: Sed Rate: 140 mm/hr — ABNORMAL HIGH (ref 0–16)

## 2014-12-02 ENCOUNTER — Other Ambulatory Visit (HOSPITAL_COMMUNITY): Payer: Self-pay

## 2014-12-02 LAB — CBC WITH DIFFERENTIAL/PLATELET
BASOS ABS: 0 10*3/uL (ref 0.0–0.1)
BASOS PCT: 0 % (ref 0–1)
Eosinophils Absolute: 0 10*3/uL (ref 0.0–0.7)
Eosinophils Relative: 0 % (ref 0–5)
HEMATOCRIT: 27.5 % — AB (ref 39.0–52.0)
Hemoglobin: 8.9 g/dL — ABNORMAL LOW (ref 13.0–17.0)
Lymphocytes Relative: 6 % — ABNORMAL LOW (ref 12–46)
Lymphs Abs: 1.5 10*3/uL (ref 0.7–4.0)
MCH: 31.6 pg (ref 26.0–34.0)
MCHC: 32.4 g/dL (ref 30.0–36.0)
MCV: 97.5 fL (ref 78.0–100.0)
MONOS PCT: 7 % (ref 3–12)
Monocytes Absolute: 1.8 10*3/uL — ABNORMAL HIGH (ref 0.1–1.0)
Neutro Abs: 22.2 10*3/uL — ABNORMAL HIGH (ref 1.7–7.7)
Neutrophils Relative %: 87 % — ABNORMAL HIGH (ref 43–77)
Platelets: 556 10*3/uL — ABNORMAL HIGH (ref 150–400)
RBC: 2.82 MIL/uL — ABNORMAL LOW (ref 4.22–5.81)
RDW: 17 % — ABNORMAL HIGH (ref 11.5–15.5)
WBC: 25.5 10*3/uL — ABNORMAL HIGH (ref 4.0–10.5)

## 2014-12-02 LAB — MAGNESIUM: Magnesium: 2 mg/dL (ref 1.5–2.5)

## 2014-12-02 LAB — BASIC METABOLIC PANEL
ANION GAP: 10 (ref 5–15)
BUN: 11 mg/dL (ref 6–23)
CO2: 27 mmol/L (ref 19–32)
CREATININE: 1.23 mg/dL (ref 0.50–1.35)
Calcium: 8.2 mg/dL — ABNORMAL LOW (ref 8.4–10.5)
Chloride: 99 mmol/L (ref 96–112)
GFR calc non Af Amer: 62 mL/min — ABNORMAL LOW (ref >90)
GFR, EST AFRICAN AMERICAN: 72 mL/min — AB (ref >90)
Glucose, Bld: 141 mg/dL — ABNORMAL HIGH (ref 70–99)
POTASSIUM: 3.7 mmol/L (ref 3.5–5.1)
SODIUM: 136 mmol/L (ref 135–145)

## 2014-12-02 LAB — PROCALCITONIN: Procalcitonin: 2.1 ng/mL

## 2014-12-03 ENCOUNTER — Other Ambulatory Visit (HOSPITAL_COMMUNITY): Payer: Self-pay

## 2014-12-03 NOTE — Progress Notes (Addendum)
Patient ID: Xavier Khan, male   DOB: 1955/11/24, 59 y.o.   MRN: [id number]   Protein calorie malnutrition Non healing wounds; +MRSA  Request for percutaneous gastric tube placement Dr Kathlene Cote has reviewed CT Abd---anatomy amenable to procedure But Bowel full of stooll Bowel prep has been ordered- will check kub in am  Also noted wbc 25.5 1/25 T 99.6  Will keep on radar for appropriate timing.  Will need to discuss fully with family when available  Addendum: I did see niece in room Talked with her and pt about procedure Pt is alert and oriented to name; dob; place Date; events--- answers all questions appropriately. He is refusing this procedure. Niece aware. Will touch base with this pt and family when wbc wnl; afeb.Marland KitchenMarland KitchenMarland Kitchen

## 2014-12-04 ENCOUNTER — Other Ambulatory Visit (HOSPITAL_COMMUNITY): Payer: Self-pay

## 2014-12-04 LAB — COMPREHENSIVE METABOLIC PANEL
ALT: <5 U/L (ref 0–53)
ANION GAP: 6 (ref 5–15)
AST: 18 U/L (ref 0–37)
Albumin: 1.5 g/dL — ABNORMAL LOW (ref 3.5–5.2)
Alkaline Phosphatase: 125 U/L — ABNORMAL HIGH (ref 39–117)
BUN: 12 mg/dL (ref 6–23)
CO2: 29 mmol/L (ref 19–32)
CREATININE: 1.12 mg/dL (ref 0.50–1.35)
Calcium: 7.9 mg/dL — ABNORMAL LOW (ref 8.4–10.5)
Chloride: 102 mmol/L (ref 96–112)
GFR calc Af Amer: 81 mL/min — ABNORMAL LOW (ref >90)
GFR calc non Af Amer: 70 mL/min — ABNORMAL LOW (ref >90)
Glucose, Bld: 82 mg/dL (ref 70–99)
POTASSIUM: 3.3 mmol/L — AB (ref 3.5–5.1)
Sodium: 137 mmol/L (ref 135–145)
TOTAL PROTEIN: 9.1 g/dL — AB (ref 6.0–8.3)
Total Bilirubin: 0.6 mg/dL (ref 0.3–1.2)

## 2014-12-04 LAB — CBC WITH DIFFERENTIAL/PLATELET
BASOS ABS: 0 10*3/uL (ref 0.0–0.1)
Basophils Relative: 0 % (ref 0–1)
EOS ABS: 0.1 10*3/uL (ref 0.0–0.7)
Eosinophils Relative: 0 % (ref 0–5)
HCT: 24.2 % — ABNORMAL LOW (ref 39.0–52.0)
Hemoglobin: 7.6 g/dL — ABNORMAL LOW (ref 13.0–17.0)
LYMPHS ABS: 1.2 10*3/uL (ref 0.7–4.0)
LYMPHS PCT: 6 % — AB (ref 12–46)
MCH: 31.1 pg (ref 26.0–34.0)
MCHC: 31.4 g/dL (ref 30.0–36.0)
MCV: 99.2 fL (ref 78.0–100.0)
MONO ABS: 1.4 10*3/uL — AB (ref 0.1–1.0)
Monocytes Relative: 8 % (ref 3–12)
NEUTROS PCT: 86 % — AB (ref 43–77)
Neutro Abs: 16.4 10*3/uL — ABNORMAL HIGH (ref 1.7–7.7)
Platelets: 441 10*3/uL — ABNORMAL HIGH (ref 150–400)
RBC: 2.44 MIL/uL — ABNORMAL LOW (ref 4.22–5.81)
RDW: 16.8 % — AB (ref 11.5–15.5)
WBC: 19.1 10*3/uL — AB (ref 4.0–10.5)

## 2014-12-04 LAB — BASIC METABOLIC PANEL
ANION GAP: 4 — AB (ref 5–15)
BUN: 14 mg/dL (ref 6–23)
CALCIUM: 8 mg/dL — AB (ref 8.4–10.5)
CHLORIDE: 100 mmol/L (ref 96–112)
CO2: 30 mmol/L (ref 19–32)
Creatinine, Ser: 1.16 mg/dL (ref 0.50–1.35)
GFR, EST AFRICAN AMERICAN: 77 mL/min — AB (ref >90)
GFR, EST NON AFRICAN AMERICAN: 67 mL/min — AB (ref >90)
Glucose, Bld: 101 mg/dL — ABNORMAL HIGH (ref 70–99)
Potassium: 3.2 mmol/L — ABNORMAL LOW (ref 3.5–5.1)
SODIUM: 134 mmol/L — AB (ref 135–145)

## 2014-12-04 LAB — MAGNESIUM: Magnesium: 2 mg/dL (ref 1.5–2.5)

## 2014-12-04 LAB — TRIGLYCERIDES: TRIGLYCERIDES: 74 mg/dL (ref <150)

## 2014-12-04 LAB — PHOSPHORUS: Phosphorus: 3.6 mg/dL (ref 2.3–4.6)

## 2014-12-05 LAB — PREPARE RBC (CROSSMATCH)

## 2014-12-05 LAB — HEMOGLOBIN AND HEMATOCRIT, BLOOD
HCT: 23.1 % — ABNORMAL LOW (ref 39.0–52.0)
Hemoglobin: 7.2 g/dL — ABNORMAL LOW (ref 13.0–17.0)

## 2014-12-05 LAB — POTASSIUM: POTASSIUM: 3.7 mmol/L (ref 3.5–5.1)

## 2014-12-05 LAB — PREALBUMIN: Prealbumin: 3.6 mg/dL — ABNORMAL LOW (ref 17.0–34.0)

## 2014-12-06 LAB — BASIC METABOLIC PANEL
Anion gap: 4 — ABNORMAL LOW (ref 5–15)
BUN: 11 mg/dL (ref 6–23)
CALCIUM: 8.5 mg/dL (ref 8.4–10.5)
CO2: 27 mmol/L (ref 19–32)
CREATININE: 1.01 mg/dL (ref 0.50–1.35)
Chloride: 105 mmol/L (ref 96–112)
GFR calc non Af Amer: 79 mL/min — ABNORMAL LOW (ref >90)
Glucose, Bld: 227 mg/dL — ABNORMAL HIGH (ref 70–99)
Potassium: 4.2 mmol/L (ref 3.5–5.1)
Sodium: 136 mmol/L (ref 135–145)

## 2014-12-06 LAB — CBC
HEMATOCRIT: 33 % — AB (ref 39.0–52.0)
Hemoglobin: 10.9 g/dL — ABNORMAL LOW (ref 13.0–17.0)
MCH: 30.6 pg (ref 26.0–34.0)
MCHC: 33 g/dL (ref 30.0–36.0)
MCV: 92.7 fL (ref 78.0–100.0)
Platelets: 353 10*3/uL (ref 150–400)
RBC: 3.56 MIL/uL — ABNORMAL LOW (ref 4.22–5.81)
RDW: 17.6 % — ABNORMAL HIGH (ref 11.5–15.5)
WBC: 17.7 10*3/uL — AB (ref 4.0–10.5)

## 2014-12-06 LAB — PHOSPHORUS: PHOSPHORUS: 3.4 mg/dL (ref 2.3–4.6)

## 2014-12-06 LAB — MAGNESIUM: MAGNESIUM: 1.9 mg/dL (ref 1.5–2.5)

## 2014-12-07 LAB — TYPE AND SCREEN
ABO/RH(D): A POS
ANTIBODY SCREEN: NEGATIVE
Unit division: 0
Unit division: 0

## 2014-12-09 ENCOUNTER — Other Ambulatory Visit (HOSPITAL_COMMUNITY): Payer: Self-pay

## 2014-12-09 LAB — COMPREHENSIVE METABOLIC PANEL
ALBUMIN: 1.5 g/dL — AB (ref 3.5–5.2)
ALK PHOS: 156 U/L — AB (ref 39–117)
ALT: 42 U/L (ref 0–53)
ALT: 50 U/L (ref 0–53)
ANION GAP: 4 — AB (ref 5–15)
ANION GAP: 7 (ref 5–15)
AST: 56 U/L — AB (ref 0–37)
AST: 70 U/L — ABNORMAL HIGH (ref 0–37)
Albumin: 1.5 g/dL — ABNORMAL LOW (ref 3.5–5.2)
Alkaline Phosphatase: 169 U/L — ABNORMAL HIGH (ref 39–117)
BILIRUBIN TOTAL: 0.8 mg/dL (ref 0.3–1.2)
BILIRUBIN TOTAL: 0.7 mg/dL (ref 0.3–1.2)
BUN: 22 mg/dL (ref 6–23)
BUN: 23 mg/dL (ref 6–23)
CO2: 28 mmol/L (ref 19–32)
CO2: 27 mmol/L (ref 19–32)
CREATININE: 0.84 mg/dL (ref 0.50–1.35)
Calcium: 8.4 mg/dL (ref 8.4–10.5)
Calcium: 8.2 mg/dL — ABNORMAL LOW (ref 8.4–10.5)
Chloride: 106 mmol/L (ref 96–112)
Chloride: 105 mmol/L (ref 96–112)
Creatinine, Ser: 0.82 mg/dL (ref 0.50–1.35)
GFR calc Af Amer: >90 mL/min (ref >90)
GFR calc non Af Amer: >90 mL/min (ref >90)
GLUCOSE: 301 mg/dL — AB (ref 70–99)
Glucose, Bld: 274 mg/dL — ABNORMAL HIGH (ref 70–99)
POTASSIUM: 3.5 mmol/L (ref 3.5–5.1)
Potassium: 3.8 mmol/L (ref 3.5–5.1)
Sodium: 138 mmol/L (ref 135–145)
Sodium: 139 mmol/L (ref 135–145)
Total Protein: 10.2 g/dL — ABNORMAL HIGH (ref 6.0–8.3)
Total Protein: 10.3 g/dL — ABNORMAL HIGH (ref 6.0–8.3)

## 2014-12-09 LAB — CBC WITH DIFFERENTIAL/PLATELET
Basophils Absolute: 0 10*3/uL (ref 0.0–0.1)
Basophils Relative: 0 % (ref 0–1)
EOS ABS: 0.1 10*3/uL (ref 0.0–0.7)
EOS PCT: 1 % (ref 0–5)
HCT: 30.8 % — ABNORMAL LOW (ref 39.0–52.0)
Hemoglobin: 9.8 g/dL — ABNORMAL LOW (ref 13.0–17.0)
Lymphocytes Relative: 9 % — ABNORMAL LOW (ref 12–46)
Lymphs Abs: 1.3 10*3/uL (ref 0.7–4.0)
MCH: 31.1 pg (ref 26.0–34.0)
MCHC: 31.8 g/dL (ref 30.0–36.0)
MCV: 97.8 fL (ref 78.0–100.0)
MONO ABS: 1 10*3/uL (ref 0.1–1.0)
MONOS PCT: 7 % (ref 3–12)
Neutro Abs: 12.3 10*3/uL — ABNORMAL HIGH (ref 1.7–7.7)
Neutrophils Relative %: 83 % — ABNORMAL HIGH (ref 43–77)
Platelets: 307 10*3/uL (ref 150–400)
RBC: 3.15 MIL/uL — ABNORMAL LOW (ref 4.22–5.81)
RDW: 16.5 % — AB (ref 11.5–15.5)
WBC: 14.8 10*3/uL — AB (ref 4.0–10.5)

## 2014-12-09 LAB — TRIGLYCERIDES: TRIGLYCERIDES: 66 mg/dL (ref <150)

## 2014-12-09 LAB — PHOSPHORUS
Phosphorus: 3.4 mg/dL (ref 2.3–4.6)
Phosphorus: 3.5 mg/dL (ref 2.3–4.6)

## 2014-12-09 LAB — MAGNESIUM: MAGNESIUM: 2.1 mg/dL (ref 1.5–2.5)

## 2014-12-09 LAB — PREALBUMIN: Prealbumin: 5.3 mg/dL — ABNORMAL LOW (ref 17.0–34.0)

## 2014-12-11 ENCOUNTER — Other Ambulatory Visit (HOSPITAL_COMMUNITY): Payer: Medicare (Managed Care)

## 2014-12-16 LAB — CBC WITH DIFFERENTIAL/PLATELET
Basophils Absolute: 0 10*3/uL (ref 0.0–0.1)
Basophils Relative: 0 % (ref 0–1)
EOS PCT: 1 % (ref 0–5)
Eosinophils Absolute: 0.2 10*3/uL (ref 0.0–0.7)
HEMATOCRIT: 27.2 % — AB (ref 39.0–52.0)
Hemoglobin: 8.6 g/dL — ABNORMAL LOW (ref 13.0–17.0)
Lymphocytes Relative: 10 % — ABNORMAL LOW (ref 12–46)
Lymphs Abs: 1.5 10*3/uL (ref 0.7–4.0)
MCH: 30.8 pg (ref 26.0–34.0)
MCHC: 31.6 g/dL (ref 30.0–36.0)
MCV: 97.5 fL (ref 78.0–100.0)
MONOS PCT: 6 % (ref 3–12)
Monocytes Absolute: 0.9 10*3/uL (ref 0.1–1.0)
Neutro Abs: 12.4 10*3/uL — ABNORMAL HIGH (ref 1.7–7.7)
Neutrophils Relative %: 83 % — ABNORMAL HIGH (ref 43–77)
Platelets: 291 10*3/uL (ref 150–400)
RBC: 2.79 MIL/uL — ABNORMAL LOW (ref 4.22–5.81)
RDW: 15.4 % (ref 11.5–15.5)
WBC: 15 10*3/uL — AB (ref 4.0–10.5)

## 2014-12-16 LAB — BASIC METABOLIC PANEL
ANION GAP: 2 — AB (ref 5–15)
BUN: 17 mg/dL (ref 6–23)
CHLORIDE: 107 mmol/L (ref 96–112)
CO2: 31 mmol/L (ref 19–32)
Calcium: 7.9 mg/dL — ABNORMAL LOW (ref 8.4–10.5)
Creatinine, Ser: 0.9 mg/dL (ref 0.50–1.35)
GFR calc Af Amer: >90 mL/min (ref >90)
Glucose, Bld: 129 mg/dL — ABNORMAL HIGH (ref 70–99)
Potassium: 3.5 mmol/L (ref 3.5–5.1)
Sodium: 140 mmol/L (ref 135–145)

## 2014-12-16 LAB — MAGNESIUM: Magnesium: 1.9 mg/dL (ref 1.5–2.5)

## 2014-12-17 LAB — PREALBUMIN: Prealbumin: 6.5 mg/dL — ABNORMAL LOW (ref 17.0–34.0)

## 2014-12-25 ENCOUNTER — Other Ambulatory Visit: Payer: Self-pay | Admitting: Emergency Medicine

## 2014-12-26 ENCOUNTER — Other Ambulatory Visit: Payer: Self-pay | Admitting: Physician Assistant

## 2015-01-07 ENCOUNTER — Ambulatory Visit
Admit: 2015-01-07 | Disposition: A | Discharge status: Home / Self Care | Payer: Self-pay | Attending: Internal Medicine | Admitting: Internal Medicine

## 2015-01-07 DEATH — deceased

## 2015-03-24 IMAGING — CR DG CHEST 1V PORT
1 series · 1 of 1 positions shown · non-contrast
Comparison: 11/18/2014

CLINICAL DATA: Cough, coarsening lung sounds

EXAM:
PORTABLE CHEST - 1 VIEW

[ap]
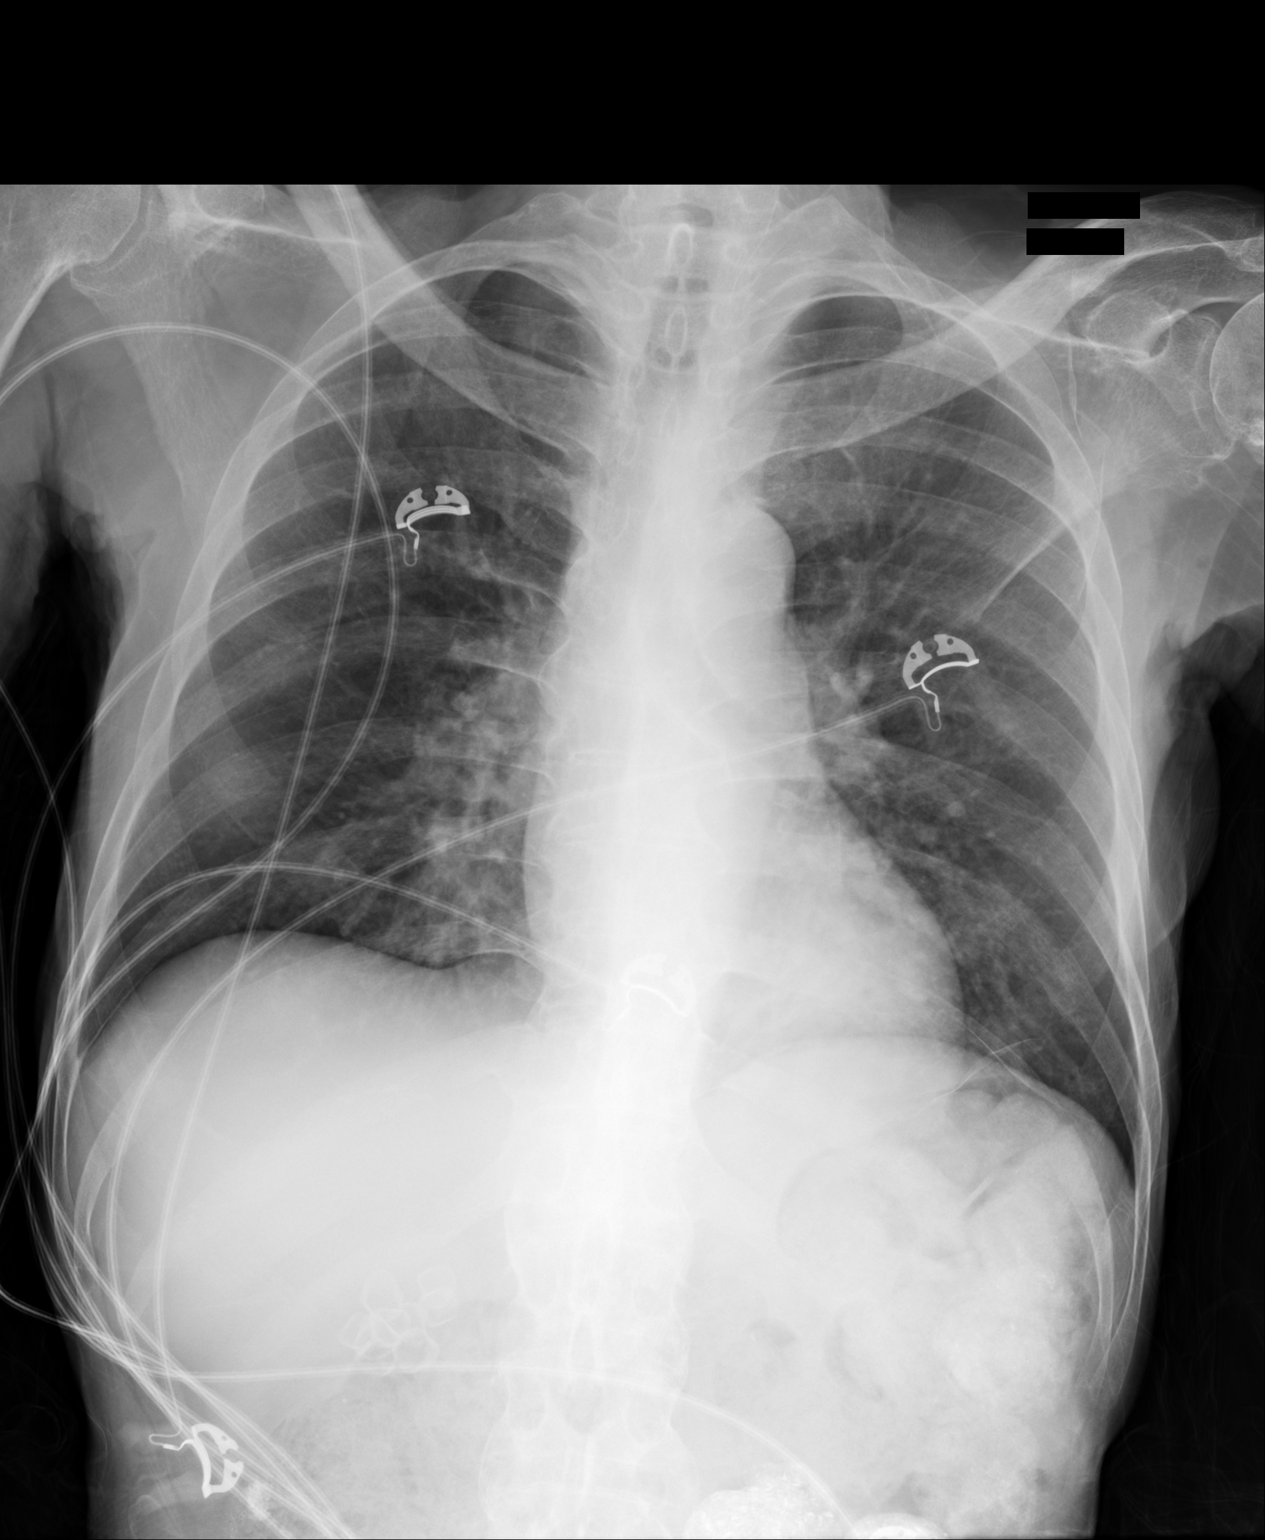

[1 of 1 positions shown; findings below may reference images not displayed]

FINDINGS: Cardiomediastinal silhouette is stable. No acute infiltrate or
pleural effusion. No pulmonary edema. Mild basilar atelectasis or
scarring.
IMPRESSION: No active disease.
